# Patient Record
Sex: Male | Born: 1975 | Race: White | Hispanic: No | Marital: Married | Smoking: Never smoker
Health system: Southern US, Community
[De-identification: ages and names within clinical notes are randomized; demographics above are authoritative.]

## PROBLEM LIST (undated history)

## (undated) DIAGNOSIS — F419 Anxiety disorder, unspecified: Secondary | ICD-10-CM

## (undated) DIAGNOSIS — Z8052 Family history of malignant neoplasm of bladder: Secondary | ICD-10-CM

## (undated) DIAGNOSIS — F32A Depression, unspecified: Secondary | ICD-10-CM

## (undated) DIAGNOSIS — K219 Gastro-esophageal reflux disease without esophagitis: Secondary | ICD-10-CM

## (undated) HISTORY — PX: COLONOSCOPY: SHX174

## (undated) HISTORY — PX: NO PAST SURGERIES: SHX2092

## (undated) HISTORY — DX: Gastro-esophageal reflux disease without esophagitis: K21.9

## (undated) HISTORY — DX: Family history of malignant neoplasm of bladder: Z80.52

---

## 2018-07-08 ENCOUNTER — Other Ambulatory Visit: Payer: Self-pay | Admitting: Student

## 2018-07-08 DIAGNOSIS — R599 Enlarged lymph nodes, unspecified: Secondary | ICD-10-CM

## 2018-07-11 ENCOUNTER — Ambulatory Visit
Admission: RE | Admit: 2018-07-11 | Discharge: 2018-07-11 | Disposition: A | Discharge status: Home / Self Care | Payer: 59 | Source: Ambulatory Visit | Attending: Student | Admitting: Student

## 2018-07-11 DIAGNOSIS — R599 Enlarged lymph nodes, unspecified: Secondary | ICD-10-CM | POA: Diagnosis present

## 2021-01-09 ENCOUNTER — Encounter (HOSPITAL_COMMUNITY): Payer: Self-pay | Admitting: *Deleted

## 2021-01-09 ENCOUNTER — Emergency Department (HOSPITAL_COMMUNITY)
Admission: EM | Admit: 2021-01-09 | Discharge: 2021-01-09 | Disposition: A | Discharge status: Home / Self Care | Payer: BC Managed Care – PPO | Attending: Emergency Medicine | Admitting: Emergency Medicine

## 2021-01-09 ENCOUNTER — Other Ambulatory Visit: Payer: Self-pay

## 2021-01-09 DIAGNOSIS — K922 Gastrointestinal hemorrhage, unspecified: Secondary | ICD-10-CM | POA: Insufficient documentation

## 2021-01-09 DIAGNOSIS — R001 Bradycardia, unspecified: Secondary | ICD-10-CM | POA: Diagnosis not present

## 2021-01-09 DIAGNOSIS — R55 Syncope and collapse: Secondary | ICD-10-CM | POA: Diagnosis present

## 2021-01-09 HISTORY — DX: Anxiety disorder, unspecified: F41.9

## 2021-01-09 LAB — COMPREHENSIVE METABOLIC PANEL
ALT: 29 U/L (ref 0–44)
AST: 25 U/L (ref 15–41)
Albumin: 3.7 g/dL (ref 3.5–5.0)
Alkaline Phosphatase: 42 U/L (ref 38–126)
Anion gap: 6 (ref 5–15)
BUN: 16 mg/dL (ref 6–20)
CO2: 25 mmol/L (ref 22–32)
Calcium: 9 mg/dL (ref 8.9–10.3)
Chloride: 108 mmol/L (ref 98–111)
Creatinine, Ser: 1.22 mg/dL (ref 0.61–1.24)
GFR, Estimated: >60 mL/min (ref >60)
Glucose, Bld: 91 mg/dL (ref 70–99)
Potassium: 4 mmol/L (ref 3.5–5.1)
Sodium: 139 mmol/L (ref 135–145)
Total Bilirubin: 0.4 mg/dL (ref 0.3–1.2)
Total Protein: 6.7 g/dL (ref 6.5–8.1)

## 2021-01-09 LAB — CBC
HCT: 41.8 % (ref 39.0–52.0)
Hemoglobin: 13.4 g/dL (ref 13.0–17.0)
MCH: 29.1 pg (ref 26.0–34.0)
MCHC: 32.1 g/dL (ref 30.0–36.0)
MCV: 90.9 fL (ref 80.0–100.0)
Platelets: 325 10*3/uL (ref 150–400)
RBC: 4.6 MIL/uL (ref 4.22–5.81)
RDW: 13.2 % (ref 11.5–15.5)
WBC: 6.3 10*3/uL (ref 4.0–10.5)
nRBC: 0 % (ref 0.0–0.2)

## 2021-01-09 NOTE — Discharge Instructions (Addendum)
Call your primary care doctor or specialist as discussed in the next 2-3 days.   Return immediately back to the ER if:  Your symptoms worsen within the next 12-24 hours. You develop new symptoms such as new fevers, persistent vomiting, new pain, shortness of breath, or new weakness or numbness, or if you have any other concerns.  

## 2021-01-09 NOTE — ED Triage Notes (Signed)
Pt reports feeling fine this am, ate breakfast. Had multiple syncopal episodes this am while at work. Reports onset of generalized abd pain when he began to feel lightheaded. Denies chest pain or sob. Reports recent blood in stools.

## 2021-01-09 NOTE — ED Provider Notes (Signed)
Emergency Medicine Provider Triage Evaluation Note  Trexton Stachowski , a 45 y.o. male  was evaluated in triage.  Pt complains of ayncope  Pt has had blood in his stool   Review of Systems  Positive: nausea Negative: no fever or chills    Physical Exam  BP 131/87 (BP Location: Left Arm)   Pulse 75   Temp 97.8 F (36.6 C) (Oral)   Resp 16   SpO2 100%  Gen:   Awake, no distress   Resp:  Normal effort  MSK:   Moves extremities without difficulty  Other:    Medical Decision Making  Medically screening exam initiated at 10:18 AM.  Appropriate orders placed.  Khalib Kerns was informed that the remainder of the evaluation will be completed by another provider, this initial triage assessment does not replace that evaluation, and the importance of remaining in the ED until their evaluation is complete.     Sidney Ace 01/09/21 1018    Luna Fuse, MD 01/09/21 1800

## 2021-01-09 NOTE — ED Notes (Signed)
Notified nurse of pt HR

## 2021-01-09 NOTE — ED Provider Notes (Signed)
[facility] EMERGENCY DEPARTMENT Provider Note   CSN: [id number] Arrival date & time: 01/09/21  0948     History Chief Complaint  Patient presents with   Near Syncope    Jerry Allison is a 45 y.o. male.  Patient presents chief complaint of a syncopal episode at work.  He states he was sitting on a work when he felt a little lightheaded.  He stood up to take a walk and the next he knew he was waking up from the ground.  He was helped up by his colleagues and he sat down and he states that he syncopized again while he was sitting in his chair.  This was greater than 6 hours prior to arrival.  He states that he had a sharp spasm of abdominal pain during the syncopal episodes.  Patient states that has been having intermittent bloody stools ongoing for the past 2 months.  This has not significantly changed.  He had a spasm of abdominal pain but currently denies any abdominal pain.  Denies fevers or cough or vomiting.  No headache or chest pain.  He states that he is currently without any symptoms.      Past Medical History:  Diagnosis Date   Anxiety     There are no problems to display for this patient.   History reviewed. No pertinent surgical history.     History reviewed. No pertinent family history.  Social History   Tobacco Use   Smoking status: Never   Smokeless tobacco: Never  Substance Use Topics   Alcohol use: Yes    Comment: occ   Drug use: Yes    Types: Marijuana    Comment: occ    Home Medications Prior to Admission medications   Not on File    Allergies    Shellfish allergy  Review of Systems   Review of Systems  Constitutional:  Negative for fever.  HENT:  Negative for ear pain and sore throat.   Eyes:  Negative for pain.  Respiratory:  Negative for cough.   Cardiovascular:  Negative for chest pain.  Gastrointestinal:  Positive for abdominal pain.  Genitourinary:  Negative for flank pain.  Musculoskeletal:  Negative for back  pain.  Skin:  Negative for color change and rash.  Neurological:  Positive for syncope.  All other systems reviewed and are negative.  Physical Exam Updated Vital Signs BP (!) 136/91 (BP Location: Left Arm)   Pulse 65   Temp 98 F (36.7 C) (Oral)   Resp 18   SpO2 100%   Physical Exam Constitutional:      Appearance: He is well-developed.  HENT:     Head: Normocephalic.     Nose: Nose normal.  Eyes:     Extraocular Movements: Extraocular movements intact.  Cardiovascular:     Rate and Rhythm: Normal rate and regular rhythm.  Pulmonary:     Effort: Pulmonary effort is normal.  Skin:    Coloration: Skin is not jaundiced.  Neurological:     General: No focal deficit present.     Mental Status: He is alert and oriented to person, place, and time. Mental status is at baseline.     Cranial Nerves: No cranial nerve deficit.     Motor: No weakness.     Gait: Gait normal.    ED Results / Procedures / Treatments   Labs (all labs ordered are listed, but only abnormal results are displayed) Labs Reviewed  CBC  COMPREHENSIVE METABOLIC  PANEL  URINALYSIS, ROUTINE W REFLEX MICROSCOPIC  CBG MONITORING, ED    EKG EKG Interpretation  Date/Time:  Monday January 09 2021 09:57:38 EDT Ventricular Rate:  78 PR Interval:  154 QRS Duration: 92 QT Interval:  370 QTC Calculation: 421 R Axis:   77 Text Interpretation: Normal sinus rhythm Normal ECG Confirmed by Thamas Jaegers ([id number]) on 01/09/2021 5:12:57 PM  Radiology No results found.  Procedures Procedures   Medications Ordered in ED Medications - No data to display  ED Course  I have reviewed the triage vital signs and the nursing notes.  Pertinent labs & imaging results that were available during my care of the patient were reviewed by me and considered in my medical decision making (see chart for details).  Clinical Course as of 01/09/21 1804  Mon Jan 09, 2021  [id number] Basic metabolic panel [LS]    Clinical Course User  Index [LS] Fransico Meadow, [place]   MDM Rules/Calculators/A&P                           Vital signs are within normal limits on arrival blood pressure heart rate normal oxygenation normal on room air.  Labs are unremarkable white count chemistry hemoglobin normal.  EKG shows sinus rhythm no ST elevations depressions rate mildly bradycardic otherwise normal.  Abdominal exam is benign no guarding or tenderness.  Patient states has been having a history of rectal bleeding for the past 2 months that is intermittent, he was sent to see a gastroenterologist but was not able to finish the visit due to insurance issues.  Patient remains asymptomatic at this time he has been in the ER for over 6 hours without any additional adverse events.  He is ambulatory without assistance.  Discharged home stable condition.  Advise outpatient follow-up with GI within the week.  Advised immediate return for fevers pain recurrent episodes or any additional concerns.  Final Clinical Impression(s) / ED Diagnoses Final diagnoses:  Syncope and collapse  Gastrointestinal hemorrhage, unspecified gastrointestinal hemorrhage type    Rx / DC Orders ED Discharge Orders     None        Luna Fuse, MD 01/09/21 [phone]

## 2021-02-20 ENCOUNTER — Encounter: Payer: Self-pay | Admitting: Gastroenterology

## 2021-02-20 ENCOUNTER — Ambulatory Visit (INDEPENDENT_AMBULATORY_CARE_PROVIDER_SITE_OTHER): Payer: BC Managed Care – PPO | Admitting: Gastroenterology

## 2021-02-20 ENCOUNTER — Other Ambulatory Visit (INDEPENDENT_AMBULATORY_CARE_PROVIDER_SITE_OTHER): Payer: BC Managed Care – PPO

## 2021-02-20 VITALS — BP 142/80 | HR 76 | Ht 72.0 in | Wt 187.4 lb

## 2021-02-20 DIAGNOSIS — R1030 Lower abdominal pain, unspecified: Secondary | ICD-10-CM | POA: Diagnosis not present

## 2021-02-20 DIAGNOSIS — K921 Melena: Secondary | ICD-10-CM

## 2021-02-20 DIAGNOSIS — R197 Diarrhea, unspecified: Secondary | ICD-10-CM

## 2021-02-20 LAB — CBC WITH DIFFERENTIAL/PLATELET
Basophils Absolute: 0 10*3/uL (ref 0.0–0.1)
Basophils Relative: 0.5 % (ref 0.0–3.0)
Eosinophils Absolute: 0.2 10*3/uL (ref 0.0–0.7)
Eosinophils Relative: 3.5 % (ref 0.0–5.0)
HCT: 35.9 % — ABNORMAL LOW (ref 39.0–52.0)
Hemoglobin: 11.7 g/dL — ABNORMAL LOW (ref 13.0–17.0)
Lymphocytes Relative: 21.2 % (ref 12.0–46.0)
Lymphs Abs: 1.3 10*3/uL (ref 0.7–4.0)
MCHC: 32.7 g/dL (ref 30.0–36.0)
MCV: 86.2 fl (ref 78.0–100.0)
Monocytes Absolute: 0.7 10*3/uL (ref 0.1–1.0)
Monocytes Relative: 10.7 % (ref 3.0–12.0)
Neutro Abs: 4 10*3/uL (ref 1.4–7.7)
Neutrophils Relative %: 64.1 % (ref 43.0–77.0)
Platelets: 367 10*3/uL (ref 150.0–400.0)
RBC: 4.16 Mil/uL — ABNORMAL LOW (ref 4.22–5.81)
RDW: 13.2 % (ref 11.5–15.5)
WBC: 6.2 10*3/uL (ref 4.0–10.5)

## 2021-02-20 LAB — SEDIMENTATION RATE: Sed Rate: 6 mm/hr (ref 0–15)

## 2021-02-20 LAB — C-REACTIVE PROTEIN: CRP: <1 mg/dL (ref 0.5–20.0)

## 2021-02-20 MED ORDER — PLENVU 140 G PO SOLR: ORAL | 0 refills | Status: DC

## 2021-02-20 MED ORDER — DICYCLOMINE HCL 20 MG PO TABS
20.0000 mg | ORAL_TABLET | Freq: Three times a day (TID) | ORAL | 1 refills | Status: DC | PRN
Start: 2021-02-20 — End: 2022-01-03

## 2021-02-20 NOTE — Patient Instructions (Signed)
If you are age 45 or older, your body mass index should be between 23-30. Your Body mass index is 25.42 kg/m. If this is out of the aforementioned range listed, please consider follow up with your Primary Care Provider.  If you are age 12 or younger, your body mass index should be between 19-25. Your Body mass index is 25.42 kg/m. If this is out of the aformentioned range listed, please consider follow up with your Primary Care Provider.   Your provider has requested that you go to the basement level for lab work before leaving today. Press "B" on the elevator. The lab is located at the first door on the left as you exit the elevator.  You have been scheduled for a colonoscopy. Please follow written instructions given to you at your visit today.  Please pick up your prep supplies at the pharmacy within the next 1-3 days. If you use inhalers (even only as needed), please bring them with you on the day of your procedure.  The [facility] providers would like to encourage you to use [facility] to communicate with providers for non-urgent requests or questions.  Due to long hold times on the telephone, sending your provider a message by [facility] may be a faster and more efficient way to get a response.  Please allow 48 business hours for a response.  Please remember that this is for non-urgent requests.   It was a pleasure to see you today!  Thank you for trusting me with your gastrointestinal care!    Scott E. [name], MD

## 2021-02-20 NOTE — Progress Notes (Signed)
HPI : Jerry Allison is a very pleasant 45 year old male with a history of anxiety who was referred to [place] by Dr. Derinda Late for further evaluation of hematochezia, abdominal pain and diarrhea.  The patient states that he has been seeing blood in his stool intermittently for the past year, but for the past several months it has been near daily in occurrence.  He started having issues with abdominal pain and diarrhea about 3 to 4 months ago.  His abdominal pain is located in the lower abdomen and is sometimes still sometimes sharp and severe.  It is experienced on a daily basis.  Seems to be worse with prolonged standing and activity, sometimes worse with eating and often improved with bowel movements.  His bowel habits vary on a day-to-day basis, but typically has 2-3 bowel movements per day, and have up to 6 bowel movements.  His stools are often loose and poorly formed and often associated with urgency.  He has had scant incontinence on occasion.  No nocturnal bowel movements.  A year ago, he would have a bowel movement once a day or every other day. The blood is described as usually bright red, but sometimes dark maroon color.  It is seen in the stool, in the toilet water and on the toilet paper.  He denies pain with the passage of stool.  His weight has been stable. He denies any changes in his diet or medications that accompanied the change in his bowel habits. No family history of GI malignancy or inflammatory bowel disease.  He has never had a colonoscopy.  About 5 weeks ago, the patient presented to the emergency room for a near syncopal event.  This occurred in the setting of episode of abdominal pain.  In the ER, CBC was checked which was normal.  Vitals and EKG are also reassuring, and he was discharged home.  He has not had any further episodes of syncope or near syncope since then.     Past Medical History:  Diagnosis Date   Anxiety      Past Surgical History:  Procedure  Laterality Date   NO PAST SURGERIES     Family History  Problem Relation Age of Onset   Anxiety disorder Father    Colon cancer Neg Hx    Esophageal cancer Neg Hx    Stomach cancer Neg Hx    Pancreatic cancer Neg Hx    Liver disease Neg Hx    Inflammatory bowel disease Neg Hx    Social History   Tobacco Use   Smoking status: Never   Smokeless tobacco: Never  Substance Use Topics   Alcohol use: Yes    Comment: social-weekly   Drug use: Yes    Types: Marijuana    Comment: occ   Current Outpatient Medications  Medication Sig Dispense Refill   escitalopram (LEXAPRO) 10 MG tablet Take 5 mg by mouth daily.     No current facility-administered medications for this visit.   Allergies  Allergen Reactions   Shellfish Allergy      Review of Systems: All systems reviewed and negative except where noted in HPI.    No results found.  Physical Exam: BP (!) 142/80   Pulse 76   Ht 6' (1.829 m)   Wt 187 lb 6.4 oz (85 kg)   BMI 25.42 kg/m  Constitutional: Pleasant,well-developed, Caucasian male in no acute distress. HEENT: Normocephalic and atraumatic. Conjunctivae are normal. No scleral icterus. Cardiovascular: Normal rate, regular rhythm.  Pulmonary/chest: Effort normal and breath sounds normal. No wheezing, rales or rhonchi. Abdominal: Soft, nondistended, nontender. Bowel sounds active throughout. There are no masses palpable. No hepatomegaly. Extremities: no edema Neurological: Alert and oriented to person place and time. Skin: Skin is warm and dry. No rashes noted. Psychiatric: Normal mood and affect. Behavior is normal.  CBC    Component Value Date/Time   WBC 6.3 01/09/2021 1021   RBC 4.60 01/09/2021 1021   HGB 13.4 01/09/2021 1021   HCT 41.8 01/09/2021 1021   PLT 325 01/09/2021 1021   MCV 90.9 01/09/2021 1021   MCH 29.1 01/09/2021 1021   MCHC 32.1 01/09/2021 1021   RDW 13.2 01/09/2021 1021    CMP     Component Value Date/Time   NA 139 01/09/2021 1021    K 4.0 01/09/2021 1021   CL 108 01/09/2021 1021   CO2 25 01/09/2021 1021   GLUCOSE 91 01/09/2021 1021   BUN 16 01/09/2021 1021   CREATININE 1.22 01/09/2021 1021   CALCIUM 9.0 01/09/2021 1021   PROT 6.7 01/09/2021 1021   ALBUMIN 3.7 01/09/2021 1021   AST 25 01/09/2021 1021   ALT 29 01/09/2021 1021   ALKPHOS 42 01/09/2021 1021   BILITOT 0.4 01/09/2021 1021   GFRNONAA >60 01/09/2021 1021     ASSESSMENT AND PLAN: 45 year old male with 1 year history of intermittent hematochezia, which is now become daily, with several months of new abdominal pain and increase in stool frequency.  No weight loss, no nocturnal stools and his CBC was unremarkable last month.  I suspect that the patient most likely has irritable bowel syndrome with hemorrhoidal bleeding, but inflammatory bowel disease and a mass lesion are also on the differential and need to be evaluated with endoscopy.  For today, we will schedule for a routine diagnostic colonoscopy.  We will get inflammatory markers today as well as repeat CBC.  If inflammatory markers are elevated, or if his hemoglobin has dropped since August, then we will expedite his colonoscopy.  In the meantime, I recommend he try taking Bentyl as needed for his abdominal pain.  Hematochezia/abdominal pain/diarrhea - Diagnostic colonoscopy - CBC, ESR, CRP, TTG/IgA, fecal lactoferrin -Bentyl 20 mg p.o. every 6 hours as needed for abdominal pain   The details, risks (including bleeding, perforation, infection, missed lesions, medication reactions and possible hospitalization or surgery if complications occur), benefits, and alternatives to colonoscopy with possible biopsy and possible polypectomy were discussed with the patient and he consents to proceed.    Haru Shaff E. [name], MD [facility]    Derinda Late, MD

## 2021-02-21 LAB — FECAL LACTOFERRIN, QUANT
Fecal Lactoferrin: NEGATIVE
MICRO NUMBER:: 12422507
SPECIMEN QUALITY:: ADEQUATE

## 2021-02-21 LAB — IGA: Immunoglobulin A: 184 mg/dL (ref 47–310)

## 2021-02-21 LAB — TISSUE TRANSGLUTAMINASE, IGA: (tTG) Ab, IgA: <1 U/mL

## 2021-02-25 DIAGNOSIS — C20 Malignant neoplasm of rectum: Secondary | ICD-10-CM

## 2021-02-25 HISTORY — DX: Malignant neoplasm of rectum: C20

## 2021-03-01 NOTE — Progress Notes (Signed)
Jerry Allison, the tests for inflammation (ESR, CRP, fecal lactoferrin) and celiac disease were normal, but your hemoglobin has dropped slightly since it was last checked.  This is still possibly due to hemorrhoids, as sometimes hemorrhoids can bleed enough to cause drops in blood counts.   It is very important that we complete your colonoscopy next week to exclude other causes of bleeding and anemia.

## 2021-03-02 ENCOUNTER — Telehealth: Payer: Self-pay

## 2021-03-02 NOTE — Progress Notes (Signed)
Called patient and informed him of lab results and that it was important that he come for his Colonoscopy scheduled next week.

## 2021-03-02 NOTE — Telephone Encounter (Signed)
Called patient and informed him of lab results and that it is important that he keeps his procedure with dr.Cunningham next week. Patient had no questions at the end of call.

## 2021-03-07 ENCOUNTER — Other Ambulatory Visit: Payer: Self-pay | Admitting: Pediatric Infectious Disease

## 2021-03-07 ENCOUNTER — Other Ambulatory Visit: Payer: BC Managed Care – PPO

## 2021-03-07 ENCOUNTER — Telehealth: Payer: Self-pay

## 2021-03-07 ENCOUNTER — Encounter: Payer: Self-pay | Admitting: Gastroenterology

## 2021-03-07 ENCOUNTER — Ambulatory Visit (AMBULATORY_SURGERY_CENTER): Payer: BC Managed Care – PPO | Admitting: Gastroenterology

## 2021-03-07 ENCOUNTER — Other Ambulatory Visit: Payer: Self-pay

## 2021-03-07 VITALS — BP 108/78 | HR 53 | Temp 97.3°F | Resp 15 | Ht 72.0 in | Wt 187.0 lb

## 2021-03-07 DIAGNOSIS — D128 Benign neoplasm of rectum: Secondary | ICD-10-CM | POA: Diagnosis not present

## 2021-03-07 DIAGNOSIS — C20 Malignant neoplasm of rectum: Secondary | ICD-10-CM

## 2021-03-07 DIAGNOSIS — K6289 Other specified diseases of anus and rectum: Secondary | ICD-10-CM

## 2021-03-07 DIAGNOSIS — K51211 Ulcerative (chronic) proctitis with rectal bleeding: Secondary | ICD-10-CM | POA: Diagnosis not present

## 2021-03-07 DIAGNOSIS — K921 Melena: Secondary | ICD-10-CM

## 2021-03-07 DIAGNOSIS — R1030 Lower abdominal pain, unspecified: Secondary | ICD-10-CM

## 2021-03-07 MED ORDER — SODIUM CHLORIDE 0.9 % IV SOLN: 500.0000 mL | Freq: Once | INTRAVENOUS | Status: DC

## 2021-03-07 NOTE — Progress Notes (Signed)
Sedate, gd SR, tolerated procedure well, VSS, report to RN 

## 2021-03-07 NOTE — Op Note (Signed)
Jerry Allison Patient Name: Jerry Allison Procedure Date: 03/07/2021 12:08 PM MRN: [id number] Endoscopist: Disautel. [name] , MD Age: 45 Referring MD:  Date of Birth: [birth year] Gender: Male Account #: [id number] Procedure:                Colonoscopy Indications:              Hematochezia Medicines:                Monitored Anesthesia Care Procedure:                Pre-Anesthesia Assessment:                           - Prior to the procedure, a History and Physical                            was performed, and patient medications and                            allergies were reviewed. The patient's tolerance of                            previous anesthesia was also reviewed. The risks                            and benefits of the procedure and the sedation                            options and risks were discussed with the patient.                            All questions were answered, and informed consent                            was obtained. Prior Anticoagulants: The patient has                            taken no previous anticoagulant or antiplatelet                            agents. ASA Grade Assessment: II - A patient with                            mild systemic disease. After reviewing the risks                            and benefits, the patient was deemed in                            satisfactory condition to undergo the procedure.                           After obtaining informed consent, the colonoscope  was passed under direct vision. Throughout the                            procedure, the patient's blood pressure, pulse, and                            oxygen saturations were monitored continuously. The                            Olympus PCF-H190DL (#[id number]) Colonoscope was                            introduced through the anus and advanced rectal                            mass, but was unable to traverse the mass The  0441                            PCF-H190TL Slim SB Colonoscope was introduced                            through the anus and advanced to the the terminal                            ileum, with identification of the appendiceal                            orifice and IC valve.The colonoscopy was performed                            without difficulty. The patient tolerated the                            procedure well. The quality of the bowel                            preparation was good. Scope In: 12:14:18 PM Scope Out: 66:06:30 PM Scope Withdrawal Time: 0 hours 14 minutes 13 seconds  Total Procedure Duration: 0 hours 22 minutes 26 seconds  Findings:                 The perianal and digital rectal examinations were                            normal. Pertinent negatives include normal                            sphincter tone and no palpable rectal lesions.                           An ulcerated partially obstructing large friable                            mass was found in the proximal rectum (proximal  edge encountered at 15 cm, distal edge estimated at                            11 cm). The mass was circumferential. The mass                            measured four cm in length. No bleeding was                            present. The mass was not traversable with the                            pediatric colonoscope. An ultraslim colonoscope was                            then used and traversed the mass with minimal                            resistance. Biopsies were taken with a cold forceps                            for histology. Estimated blood loss was minimal.                            Area was tattooed with an injection of 2 mL of Spot                            (carbon black) about 2-3 cm proximal to the mass.                           A 3 mm polyp was found in the distal rectum. The                            polyp was sessile. The polyp was  removed with a                            cold snare. Resection and retrieval were complete.                            Estimated blood loss was minimal.                           The exam was otherwise normal throughout the                            examined colon.                           The terminal ileum appeared normal.                           The retroflexed view of the distal rectum and anal  verge was normal and showed no anal or rectal                            abnormalities. Complications:            No immediate complications. Estimated Blood Loss:     Estimated blood loss was minimal. 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 Biopsied. Tattooed.                           - One 3 mm polyp in the distal rectum, removed with                            a cold snare. Resected and retrieved.                           - The examined portion of the ileum was normal.                           - The distal rectum and anal verge are normal on                            retroflexion view. Recommendation:           - Patient has a contact number available for                            emergencies. The signs and symptoms of potential                            delayed complications were discussed with the                            patient. Return to normal activities tomorrow.                            Written discharge instructions were provided to the                            patient.                           - Resume previous diet.                           - Continue present medications.                           - Await pathology results.                           - Perform a CT scan (computed tomography) of chest  with contrast, abdomen with contrast and pelvis                            with contrast at appointment to be scheduled.                            - Check CEA today.                           - Will place referral to Colorectal surgeon Meenakshi Sazama E. [name], MD 03/07/2021 12:47:24 PM This report has been signed electronically.

## 2021-03-07 NOTE — Patient Instructions (Signed)
YOU HAD AN ENDOSCOPIC PROCEDURE TODAY AT THE [facility]:   Refer to the procedure report that was given to you for any specific questions about what was found during the examination.  If the procedure report does not answer your questions, please call your gastroenterologist to clarify.  If you requested that your care partner not be given the details of your procedure findings, then the procedure report has been included in a sealed envelope for you to review at your convenience later.  YOU SHOULD EXPECT: Some feelings of bloating in the abdomen. Passage of more gas than usual.  Walking can help get rid of the air that was put into your GI tract during the procedure and reduce the bloating. If you had a lower endoscopy (such as a colonoscopy or flexible sigmoidoscopy) you may notice spotting of blood in your stool or on the toilet paper. If you underwent a bowel prep for your procedure, you may not have a normal bowel movement for a few days.  Please Note:  You might notice some irritation and congestion in your nose or some drainage.  This is from the oxygen used during your procedure.  There is no need for concern and it should clear up in a day or so.  SYMPTOMS TO REPORT IMMEDIATELY:   Following lower endoscopy (colonoscopy or flexible sigmoidoscopy):  Excessive amounts of blood in the stool  Significant tenderness or worsening of abdominal pains  Swelling of the abdomen that is new, acute  Fever of 100F or higher  For urgent or emergent issues, a gastroenterologist can be reached at any hour by calling [phone]. Do not use MyChart messaging for urgent concerns.    DIET:  We do recommend a small meal at first, but then you may proceed to your regular diet.  Drink plenty of fluids but you should avoid alcoholic beverages for 24 hours.  ACTIVITY:  You should plan to take it easy for the rest of today and you should NOT DRIVE or use heavy machinery until tomorrow (because  of the sedation medicines used during the test).    FOLLOW UP: Our staff will call the number listed on your records 48-72 hours following your procedure to check on you and address any questions or concerns that you may have regarding the information given to you following your procedure. If we do not reach you, we will leave a message.  We will attempt to reach you two times.  During this call, we will ask if you have developed any symptoms of COVID 19. If you develop any symptoms (ie: fever, flu-like symptoms, shortness of breath, cough etc.) before then, please call [phone].  If you test positive for Covid 19 in the 2 weeks post procedure, please call and report this information to us.    If any biopsies were taken you will be contacted by phone or by letter within the next 1-3 weeks.  Please call us at [phone] if you have not heard about the biopsies in 3 weeks.    SIGNATURES/CONFIDENTIALITY: You and/or your care partner have signed paperwork which will be entered into your electronic medical record.  These signatures attest to the fact that that the information above on your After Visit Summary has been reviewed and is understood.  Full responsibility of the confidentiality of this discharge information lies with you and/or your care-partner. 

## 2021-03-07 NOTE — Telephone Encounter (Signed)
Patient notified of CT chest abd, pelvis scheduled for 03/09/21 1:00.  He is asked to be no solid food after 9:00 am and drink his contrast bottles at 11:00 and 1:00. He understands that he will be contacted directly by [facility] with an appointment date and time to discuss colon resection.  He is asked to call back for any questions or concerns.

## 2021-03-07 NOTE — Progress Notes (Signed)
Pt's states no medical or surgical changes since previsit or office visit. VS assessed by D.T 

## 2021-03-07 NOTE — Progress Notes (Signed)
History and Physical Interval Note:  No changes in patient's symptoms since his clinic visit Sept 26th.  Bleeding has decreased recently. Labs were notable for a mild anemia.  03/07/2021 12:08 PM  Jerry Allison  has presented today for endoscopic procedure(s), with the diagnosis of  Encounter Diagnoses  Name Primary?   Hematochezia Yes   Lower abdominal pain   .  The various methods of evaluation and treatment have been discussed with the patient and/or family. After consideration of risks, benefits and other options for treatment, the patient has consented to  the endoscopic procedure(s).   The patient's history has been reviewed, patient examined, no change in status, stable for endoscopic procedure(s).  I have reviewed the patient's chart and labs.  Questions were answered to the patient's satisfaction.     Byrne Capek E. [name], MD [facility]

## 2021-03-08 LAB — CEA: CEA: 1.7 ng/mL

## 2021-03-08 NOTE — Progress Notes (Signed)
I spoke with the patient today and informed him that the biopsies confirmed the diagnosis of cancer.  He is scheduled for his CT tomorrow.  His CEA level was normal.  We will likely be getting an MRI as well, based on the CT results. He informed me that he is going out of the country on Monday for a vacation and will be back Saturday.  I advised him to sign up for MyChart so that he can more easily be reached by Dr. Orest Dikes office while he is out of the country. I also advised him to recommend his first degree relatives undergo a screening colonoscopy at age 10  and that they should have one every 5 years.  His brother is 87 and should get his colonoscopy now.

## 2021-03-09 ENCOUNTER — Telehealth: Payer: Self-pay

## 2021-03-09 ENCOUNTER — Ambulatory Visit (INDEPENDENT_AMBULATORY_CARE_PROVIDER_SITE_OTHER)
Admission: RE | Admit: 2021-03-09 | Discharge: 2021-03-09 | Disposition: A | Discharge status: Home / Self Care | Payer: BC Managed Care – PPO | Source: Ambulatory Visit | Attending: Gastroenterology | Admitting: Gastroenterology

## 2021-03-09 ENCOUNTER — Other Ambulatory Visit: Payer: Self-pay

## 2021-03-09 DIAGNOSIS — K6289 Other specified diseases of anus and rectum: Secondary | ICD-10-CM | POA: Diagnosis not present

## 2021-03-09 DIAGNOSIS — K921 Melena: Secondary | ICD-10-CM

## 2021-03-09 MED ORDER — IOHEXOL 350 MG/ML SOLN
100.0000 mL | Freq: Once | INTRAVENOUS | Status: AC | PRN
  Administered 2021-03-09: 100 mL via INTRAVENOUS

## 2021-03-09 NOTE — Telephone Encounter (Signed)
  Follow up Call-  Call back number 03/07/2021  Post procedure Call Back phone  # [phone]  Permission to leave phone message Yes     Patient questions:  Do you have a fever, pain , or abdominal swelling? No. Pain Score  0 *  Have you tolerated food without any problems? Yes.    Have you been able to return to your normal activities? Yes.    Do you have any questions about your discharge instructions: Diet   No. Medications  No. Follow up visit  No.  Do you have questions or concerns about your Care? No.  Actions: * If pain score is 4 or above: No action needed, pain <4.  Have you developed a fever since your procedure? no  2.   Have you had an respiratory symptoms (SOB or cough) since your procedure? no  3.   Have you tested positive for COVID 19 since your procedure no  4.   Have you had any family members/close contacts diagnosed with the COVID 19 since your procedure?  no   If yes to any of these questions please route to Joylene John, RN and Joella Prince, RN

## 2021-03-17 ENCOUNTER — Telehealth: Payer: Self-pay

## 2021-03-17 ENCOUNTER — Other Ambulatory Visit: Payer: Self-pay

## 2021-03-17 DIAGNOSIS — K6289 Other specified diseases of anus and rectum: Secondary | ICD-10-CM

## 2021-03-17 DIAGNOSIS — R1030 Lower abdominal pain, unspecified: Secondary | ICD-10-CM

## 2021-03-17 NOTE — Telephone Encounter (Signed)
Patient is scheduled with Dr. Dema Severin for 03/21/21 9:40 with [facility]

## 2021-03-17 NOTE — Telephone Encounter (Signed)
-----   Message from [name], MD sent at 03/15/2021  2:34 PM EDT ----- Regarding: FW: Another new rectal CA Vaughan Basta,  Can you please contact Mr. Tierce and let him know his CT did not show any evidence of metastatic disease?  Also, Dr. Dema Severin would like an MRI of the pelvis for better local staging of the tumor.  Can you order that for him? Thanks,  Dr. Loletha Grayer  ----- Message ----- From: Ileana Roup, MD Sent: 03/14/2021  10:17 AM EDT To: [name], MD Subject: RE: Another new rectal CA                      Brigid Re,   Sounds like a plan. Agree with everything you said. MRI would be perfect for local staging. We will get him scheduled and I will have our office work on referrals to both medical and radiation oncology.  Gerald Stabs ----- Message ----- From: [name], MD Sent: 03/07/2021   5:16 PM EDT To: Ileana Roup, MD Subject: Another new rectal CA                          Hi Dr. Dema Severin,  I see you're out of the office, but I just wanted to get this guy on your radar when you get back. 45 year old healthy male with several months of hematochezia, change in bowel habits, found to have 4 cm partially obstructing circumferential ulcerated mass in the proximal rectum (from about 11-15 cm from anal verge).  Not able to get past with pediatric colonoscope, but was able to get through with ultraslim colonoscope without much difficulty.  Tattooed just proximal to mass.  He's not very symptomatic currently. Getting  CT c/a/p, cea, will get MRI as well unless you don't want it. Thanks again,  [employer]

## 2021-03-17 NOTE — Telephone Encounter (Signed)
Called pt and informed of results and recommendations as reviewed and documented by Dr. [name]. Verbalized acceptance and understanding. Requested auth for MRI from [facility], whom advised pt plan does not require precert. Following message sent urgently to Rhys Martini and April Pait:  [facility] Phone: [phone] Fax: [phone]   Imaging Ordered: MRI Pelvis  Diagnosis: Rectal mass, abd pain  Ordering Provider: Dr. [name]  Is a Prior Authorization needed? We are in the process of obtaining it now  Is the patient Diabetic? No  Does the patient have Hypertension? No  Does the patient have any implanted devices or hardware? No  Date of last BUN/Creat, if needed? N/A  Patient Weight? 187#  Is the patient able to get on the table? Yes  Has the patient been diagnosed with COVID? No  Is the patient waiting on COVID testing results? No  Thank you for your assistance! [facility]

## 2021-03-20 NOTE — Telephone Encounter (Signed)
Pt scheduled for MR 10/29@9am .

## 2021-03-21 ENCOUNTER — Telehealth: Payer: Self-pay | Admitting: Hematology

## 2021-03-21 NOTE — Telephone Encounter (Signed)
Scheduled appointments per 10/25 referrals. Patient is aware.

## 2021-03-25 ENCOUNTER — Ambulatory Visit (HOSPITAL_COMMUNITY): Payer: BC Managed Care – PPO

## 2021-03-27 ENCOUNTER — Ambulatory Visit (HOSPITAL_COMMUNITY)
Admission: RE | Admit: 2021-03-27 | Discharge: 2021-03-27 | Disposition: A | Discharge status: Home / Self Care | Payer: BC Managed Care – PPO | Source: Ambulatory Visit | Attending: Gastroenterology | Admitting: Gastroenterology

## 2021-03-27 ENCOUNTER — Other Ambulatory Visit: Payer: Self-pay

## 2021-03-27 ENCOUNTER — Other Ambulatory Visit: Payer: Self-pay | Admitting: Gastroenterology

## 2021-03-27 DIAGNOSIS — K6289 Other specified diseases of anus and rectum: Secondary | ICD-10-CM | POA: Insufficient documentation

## 2021-03-27 DIAGNOSIS — R1030 Lower abdominal pain, unspecified: Secondary | ICD-10-CM | POA: Insufficient documentation

## 2021-03-27 NOTE — Progress Notes (Signed)
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   MRI Pelvis 03/27/2021: Low sigmoid primary, greater than 15 cm from the anal verge, as detailed above. Poorly delineated secondary to underdistention in this region.  No evidence of pelvic nodal metastasis. 3. Trace perisigmoid fluid, new since the prior CT.  CT CAP 03/09/2021: Asymmetrical thickening in the proximal/mid rectum.  No evidence of metastatic disease.    Colonoscopy 03/07/2021: Malignant appearing partially obstructing tumor in the proximal rectum.  Biopsies of Rectal Mass 03/07/2021    Past/Anticipated interventions by surgeon, if any:  Dr. Dema Severin 03/21/2021 -Referrals to medical and radiation oncology and genetic counselors. -Return to office for follow-up in 1-2 weeks. -This does appear to be originating from his rectum however. -We discussed that based upon the MRI findings, the possibility of total neoadjuvant treatment. -Given that this lesion is not fully palpable on exam, would not be a good candidate for any sort of watch and wait approach.    Past/Anticipated interventions by medical oncology, if any:  Dr. Burr Medico 03/29/2021  Weight changes, if any: No  Bowel/Bladder complaints, if any: Fluctuates from constipation to diarrhea.  Denies urinary changes.  Nausea / Vomiting, if any: No  Pain issues, if any: Having some rectal pain due to MRI on yesterday.    Any blood per rectum: Notes blood with every stool, notes it mixed in with stool, when wiping, and falling into toilet bowl.    SAFETY ISSUES: Prior radiation? No Pacemaker/ICD? No Possible current pregnancy? N/a Is the patient on methotrexate? No  Current Complaints/Details:

## 2021-03-28 ENCOUNTER — Other Ambulatory Visit: Payer: Self-pay | Admitting: Genetic Counselor

## 2021-03-28 ENCOUNTER — Encounter: Payer: Self-pay | Admitting: Radiation Oncology

## 2021-03-28 ENCOUNTER — Ambulatory Visit
Admission: RE | Admit: 2021-03-28 | Discharge: 2021-03-28 | Disposition: A | Discharge status: Home / Self Care | Payer: BC Managed Care – PPO | Source: Ambulatory Visit | Attending: Radiation Oncology | Admitting: Radiation Oncology

## 2021-03-28 ENCOUNTER — Other Ambulatory Visit: Payer: Self-pay

## 2021-03-28 VITALS — BP 141/89 | HR 84 | Temp 97.8°F | Resp 18 | Ht 72.0 in | Wt 188.2 lb

## 2021-03-28 DIAGNOSIS — Z79899 Other long term (current) drug therapy: Secondary | ICD-10-CM | POA: Insufficient documentation

## 2021-03-28 DIAGNOSIS — C20 Malignant neoplasm of rectum: Secondary | ICD-10-CM | POA: Diagnosis not present

## 2021-03-28 NOTE — Progress Notes (Addendum)
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   HISTORY OF PRESENT ILLNESS: Jerry Allison is a 45 y.o. male seen at the request of Dr. Dema Severin for a newly diagnosed rectal cancer. The patient noted several months of bright red blood per rectum and was seen by GI and underwent a colonoscopy with Dr. [name] on 03/07/21 that revealed an ulcerated partially obstructing large mass in the proximal rectum, circumferential and measuring up to 4 cm. A 3 mm polyp in the distal rectum was also noted. Biopsies revealed adenocarcinoma of the rectal mass and the small polyp was consistent with a tubular adenoma without dysplasia or malignancy. He had a CEA that was normal at 1.7. A CT CAP on 03/09/21 showed asymmetric wall thickening of the proximal/mid rectum and no evidence of metastatic disease was otherwise noted. MRI on 03/27/21 showed no adenopathy and measured the tumor at 5.6 cm though limited by underdistention within the sigmoid. T staging was not given, and the tumor was 15 cm from the anal verge. He will see Dr. Burr Medico tomorrow as well. He's seen today to discuss next steps of treatment.    PREVIOUS RADIATION THERAPY: No   PAST MEDICAL HISTORY:  Past Medical History:  Diagnosis Date   Anxiety    Rectal cancer ([place]) 02/2021       PAST SURGICAL HISTORY: Past Surgical History:  Procedure Laterality Date   NO PAST SURGERIES       FAMILY HISTORY:  Family History  Problem Relation Age of Onset   Anxiety disorder Father    Colon cancer Neg Hx    Esophageal cancer Neg Hx    Stomach cancer Neg Hx    Pancreatic cancer Neg Hx    Liver disease Neg Hx    Inflammatory bowel disease Neg Hx       SOCIAL HISTORY:  reports that he has never smoked. He has never used smokeless tobacco. He reports current alcohol use. He reports current drug use. Drug: Marijuana.  The patient is married and lives in [place]. He works in a [employer]. He has three teenage children, and likes playing softball and basketball throughout the year.   ALLERGIES: Shellfish allergy   MEDICATIONS:  Current Outpatient Medications  Medication Sig Dispense Refill   dicyclomine (BENTYL) 20 MG tablet Take 1 tablet (20 mg total) by mouth 3 (three) times daily as needed for spasms. 60 tablet 1   escitalopram (LEXAPRO) 10 MG tablet Take 5 mg by mouth daily.     No current facility-administered medications for this encounter.     REVIEW OF SYSTEMS: On review of systems, the patient reports that he is doing okay. He has varying symptoms of loose frequent stools/mucuous, blood with most bathroom visits, and no urinary symptoms. He's concerned about bathroom functions and erectile function with radiotherapy. He does have pain when he drinks caffeinated drinks at times, and finds that eating helps sometimes setting his crampiness. He feels uncomfortable today after the rectal contrast from yesterday. He does have bleeding with most stools and this sometimes turns the water in the toilet red. No other complaints are verbalized.      PHYSICAL EXAM:  Wt Readings  from Last 3 Encounters:  03/28/21 188 lb 4 oz (85.4 kg)  03/07/21 187 lb (84.8 kg)  02/20/21 187 lb 6.4 oz (85 kg)   Temp Readings from Last 3 Encounters:  03/28/21 97.8 F (36.6 C) (Temporal)  03/07/21 (!) 97.3 F (36.3 C) (Skin)  01/09/21 98 F (36.7 C) (Oral)   BP Readings from Last 3 Encounters:  03/28/21 (!) 141/89  03/07/21 108/78  02/20/21 (!) 142/80   Pulse Readings from Last 3 Encounters:  03/28/21 84  03/07/21 (!) 53  02/20/21 76   Pain Assessment Pain Score: 5 /10  In general this is a well appearing caucasian male in  no acute distress. He's alert and oriented x4 and appropriate throughout the examination. Cardiopulmonary assessment is negative for acute distress and he exhibits normal effort.     ECOG = 1  0 - Asymptomatic (Fully active, able to carry on all predisease activities without restriction)  1 - Symptomatic but completely ambulatory (Restricted in physically strenuous activity but ambulatory and able to carry out work of a light or sedentary nature. For example, light housework, office work)  2 - Symptomatic, <50% in bed during the day (Ambulatory and capable of all self care but unable to carry out any work activities. Up and about more than 50% of waking hours)  3 - Symptomatic, >50% in bed, but not bedbound (Capable of only limited self-care, confined to bed or chair 50% or more of waking hours)  4 - Bedbound (Completely disabled. Cannot carry on any self-care. Totally confined to bed or chair)  5 - Death   Eustace Pen MM, Creech RH, Tormey DC, et al. [phone]). "Toxicity and response criteria of the [facility]". [place] Oncol. 5 (6): 649-55    LABORATORY DATA:  Lab Results  Component Value Date   WBC 6.2 02/20/2021   HGB 11.7 (L) 02/20/2021   HCT 35.9 (L) 02/20/2021   MCV 86.2 02/20/2021   PLT 367.0 02/20/2021   Lab Results  Component Value Date   NA 139 01/09/2021   K 4.0 01/09/2021   CL 108 01/09/2021   CO2 25 01/09/2021   Lab Results  Component Value Date   ALT 29 01/09/2021   AST 25 01/09/2021   ALKPHOS 42 01/09/2021   BILITOT 0.4 01/09/2021      RADIOGRAPHY: MR PELVIS WO CONTRAST  Result Date: 03/28/2021 CLINICAL DATA:  Proximal rectal mass on colonoscopy. At 11-15 cm from anal verge. EXAM: MRI PELVIS WITHOUT CONTRAST TECHNIQUE: Multiplanar multisequence MR imaging of the pelvis was performed. No intravenous contrast was administered. Ultrasound gel was administered per rectum to optimize tumor evaluation. COMPARISON:  Colonoscopy report of  03/07/2021 FINDINGS: TUMOR LOCATION Tumor distance from Anal Verge/Skin Surface: Approximately 15 cm, including on 21/3. This is most consistent with a low sigmoid primary. TUMOR DESCRIPTION Circumferential Extent: Circumferential, including on 41/6. Tumor Length: Suboptimally delineated secondary to underdistention within the sigmoid. Based on restricted diffusion, on the order of 5.6 cm on 43/8. Circumferential low sigmoid mass, as detailed above. No surrounding lymphadenopathy. There are small nodes in this surrounding mesocolon, including on 12/07. Trace fluid within the pelvis and adjacent the sigmoid, including on 05/04. New since the prior CT. No pelvic sidewall adenopathy. Normal urinary bladder and prostate.  No bowel obstruction. IMPRESSION: 1. Low sigmoid primary, greater than 15 cm from the anal verge, as detailed above. Poorly delineated secondary to underdistention in this region. 2. No evidence of pelvic nodal metastasis. 3. Trace perisigmoid  fluid, new since the prior CT. Electronically Signed   By: Abigail Miyamoto M.D.   On: 03/28/2021 09:52   CT CHEST ABDOMEN PELVIS W CONTRAST  Result Date: 03/13/2021 CLINICAL DATA:  Rectal mass, hematochezia EXAM: CT CHEST, ABDOMEN, AND PELVIS WITH CONTRAST TECHNIQUE: Multidetector CT imaging of the chest, abdomen and pelvis was performed following the standard protocol during bolus administration of intravenous contrast. CONTRAST:  163mL OMNIPAQUE IOHEXOL 350 MG/ML SOLN COMPARISON:  100 mL Omnipaque 350 IV FINDINGS: CT CHEST FINDINGS Cardiovascular: Heart size normal.  No pericardial effusion. Mediastinum/Nodes: No mass or adenopathy. Lungs/Pleura: No pleural effusion.  No pneumothorax.  Lungs clear. Musculoskeletal: No chest wall mass or suspicious bone lesions identified. CT ABDOMEN PELVIS FINDINGS Hepatobiliary: No focal liver abnormality is seen. No gallstones, gallbladder wall thickening, or biliary dilatation. Pancreas: Unremarkable. No pancreatic ductal  dilatation or surrounding inflammatory changes. Spleen: Normal in size without focal abnormality. Adrenals/Urinary Tract: Adrenal glands unremarkable. 2.6 cm probable benign cyst, superior left kidney. No urolithiasis or hydronephrosis. Urinary bladder incompletely distended. Stomach/Bowel: Stomach is incompletely distended, unremarkable. Small bowel is decompressed with good distal passage of oral contrast material. Normal appendix. The colon is nondilated. There is asymmetric wall thickening in the mid rectum which may correlate with the mass seen on colonoscopy. No evidence of perforation or abscess. Vascular/Lymphatic: No perirectal or mesenteric adenopathy. No retroperitoneal or pelvic adenopathy. Reproductive: Prostate is unremarkable. Other: Bilateral pelvic phleboliths. No ascites. No free air. No peritoneal mass. Musculoskeletal: Mild bilateral hip DJD. No fracture or worrisome bone lesion. IMPRESSION: 1. Asymmetric wall thickening in the proximal/mid rectum may correspond to mass seen on endoscopy. 2. Negative for adenopathy or evidence of distal metastatic disease. Electronically Signed   By: Lucrezia Europe M.D.   On: 03/13/2021 05:19       IMPRESSION/PLAN: 1. Adenocarcinoma of the proximal rectum/sigmoid colon. Dr. Lisbeth Renshaw discusses the pathology findings and reviews the nature of rectal verus colonic carcinoma. His MRI will be discussed tomorrow   in multidisciplinary GI oncology conference. We discussed the different pathways and course of treatment in the neoadjuvant stetting if this were classified as rectal cancer versus omission of radiotherapy if this was sigmoid colon cancer.  We discussed the risks, benefits, short, and long term effects of radiotherapy, as well as the curative intent, and the patient is interested in proceeding if appropriate. Dr. Lisbeth Renshaw discusses the delivery and logistics of radiotherapy and anticipates a course of 5 1/2 weeks of radiotherapy if it is decided that this is rectal  carcinoma. He is also aware of the role of systemic therapy and will outline this more concretely with Dr. Burr Medico tomorrow afternoon.   In a visit lasting 60 minutes, greater than 50% of the time was spent face to face discussing the patient's condition, in preparation for the discussion, and coordinating the patient's care.  The above documentation reflects my direct findings during this shared patient visit. Please see the separate note by Dr. Lisbeth Renshaw on this date for the remainder of the patient's plan of care.    Carola Rhine, [facility]   **Disclaimer: This note was dictated with voice recognition software. Similar sounding words can inadvertently be transcribed and this note may contain transcription errors which may not have been corrected upon publication of note.**  Addendum: Dr. Lisbeth Renshaw did examine the patient and performed a rectal examination. He communicated with me that he could not feel the tumor on digital rectal examination.     Carola Rhine, PAC

## 2021-03-29 ENCOUNTER — Encounter: Payer: Self-pay | Admitting: Hematology

## 2021-03-29 ENCOUNTER — Inpatient Hospital Stay (HOSPITAL_BASED_OUTPATIENT_CLINIC_OR_DEPARTMENT_OTHER): Payer: BC Managed Care – PPO | Admitting: Genetic Counselor

## 2021-03-29 ENCOUNTER — Other Ambulatory Visit: Payer: Self-pay

## 2021-03-29 ENCOUNTER — Inpatient Hospital Stay: Payer: BC Managed Care – PPO | Attending: Hematology | Admitting: Hematology

## 2021-03-29 ENCOUNTER — Inpatient Hospital Stay: Payer: BC Managed Care – PPO

## 2021-03-29 ENCOUNTER — Encounter: Payer: Self-pay | Admitting: Genetic Counselor

## 2021-03-29 VITALS — BP 132/95 | HR 69 | Temp 98.3°F | Resp 16 | Ht 72.0 in | Wt 187.3 lb

## 2021-03-29 DIAGNOSIS — R1013 Epigastric pain: Secondary | ICD-10-CM | POA: Insufficient documentation

## 2021-03-29 DIAGNOSIS — C20 Malignant neoplasm of rectum: Secondary | ICD-10-CM | POA: Diagnosis present

## 2021-03-29 DIAGNOSIS — Z79899 Other long term (current) drug therapy: Secondary | ICD-10-CM | POA: Insufficient documentation

## 2021-03-29 DIAGNOSIS — K5900 Constipation, unspecified: Secondary | ICD-10-CM | POA: Diagnosis not present

## 2021-03-29 DIAGNOSIS — F419 Anxiety disorder, unspecified: Secondary | ICD-10-CM | POA: Diagnosis not present

## 2021-03-29 DIAGNOSIS — D649 Anemia, unspecified: Secondary | ICD-10-CM | POA: Diagnosis not present

## 2021-03-29 DIAGNOSIS — Z8052 Family history of malignant neoplasm of bladder: Secondary | ICD-10-CM

## 2021-03-29 DIAGNOSIS — D500 Iron deficiency anemia secondary to blood loss (chronic): Secondary | ICD-10-CM

## 2021-03-29 LAB — CBC WITH DIFFERENTIAL (CANCER CENTER ONLY)
Abs Immature Granulocytes: 0.01 10*3/uL (ref 0.00–0.07)
Basophils Absolute: 0 10*3/uL (ref 0.0–0.1)
Basophils Relative: 0 %
Eosinophils Absolute: 0.3 10*3/uL (ref 0.0–0.5)
Eosinophils Relative: 4 %
HCT: 34.4 % — ABNORMAL LOW (ref 39.0–52.0)
Hemoglobin: 11.1 g/dL — ABNORMAL LOW (ref 13.0–17.0)
Immature Granulocytes: 0 %
Lymphocytes Relative: 21 %
Lymphs Abs: 1.4 10*3/uL (ref 0.7–4.0)
MCH: 27.5 pg (ref 26.0–34.0)
MCHC: 32.3 g/dL (ref 30.0–36.0)
MCV: 85.1 fL (ref 80.0–100.0)
Monocytes Absolute: 0.8 10*3/uL (ref 0.1–1.0)
Monocytes Relative: 11 %
Neutro Abs: 4.3 10*3/uL (ref 1.7–7.7)
Neutrophils Relative %: 64 %
Platelet Count: 368 10*3/uL (ref 150–400)
RBC: 4.04 MIL/uL — ABNORMAL LOW (ref 4.22–5.81)
RDW: 12.4 % (ref 11.5–15.5)
WBC Count: 6.8 10*3/uL (ref 4.0–10.5)
nRBC: 0 % (ref 0.0–0.2)

## 2021-03-29 LAB — FERRITIN: Ferritin: 4 ng/mL — ABNORMAL LOW (ref 24–336)

## 2021-03-29 LAB — CMP (CANCER CENTER ONLY)
ALT: 33 U/L (ref 0–44)
AST: 23 U/L (ref 15–41)
Albumin: 4 g/dL (ref 3.5–5.0)
Alkaline Phosphatase: 56 U/L (ref 38–126)
Anion gap: 6 (ref 5–15)
BUN: 18 mg/dL (ref 6–20)
CO2: 27 mmol/L (ref 22–32)
Calcium: 8.9 mg/dL (ref 8.9–10.3)
Chloride: 106 mmol/L (ref 98–111)
Creatinine: 1.19 mg/dL (ref 0.61–1.24)
GFR, Estimated: >60 mL/min (ref >60)
Glucose, Bld: 97 mg/dL (ref 70–99)
Potassium: 4.5 mmol/L (ref 3.5–5.1)
Sodium: 139 mmol/L (ref 135–145)
Total Bilirubin: <0.2 mg/dL — ABNORMAL LOW (ref 0.3–1.2)
Total Protein: 7.3 g/dL (ref 6.5–8.1)

## 2021-03-29 LAB — GENETIC SCREENING ORDER

## 2021-03-29 LAB — IRON AND TIBC
Iron: 23 ug/dL — ABNORMAL LOW (ref 42–163)
Saturation Ratios: 5 % — ABNORMAL LOW (ref 20–55)
TIBC: 430 ug/dL — ABNORMAL HIGH (ref 202–409)
UIBC: 408 ug/dL — ABNORMAL HIGH (ref 117–376)

## 2021-03-29 NOTE — Progress Notes (Signed)
REFERRING PROVIDER: Ileana Roup, MD [facility],  [place] [address]  PRIMARY PROVIDER:  Derinda Late, MD  PRIMARY REASON FOR VISIT:  1. Family history of bladder cancer   2. Adenocarcinoma of rectum ([place])      HISTORY OF PRESENT ILLNESS:   [name], a 45 y.o. male, was seen for a [place] cancer genetics consultation at the request of Dr. Dema Severin due to a personal and family history of cancer.  Jerry Allison presents to clinic today to discuss the possibility of a hereditary predisposition to cancer, genetic testing, and to further clarify his future cancer risks, as well as potential cancer risks for family members.   In October 2022, at the age of 47, Jerry Allison was diagnosed with colorectal cancer.  The treatment plan included surgery to remove the cancer.  history of cancer.    CANCER HISTORY:  Oncology History  Adenocarcinoma of rectum ([place])  03/07/2021 Procedure   Colonoscopy, Dr. [name]  Impression - Malignant appearing partially obstructing tumor in the proximal rectum. Biopsied. Tattooed. - One 3 mm polyp in the distal rectum, removed with a cold snare. Resected and retrieved. - The examined portion of the ileum was normal. - The distal rectum and anal verge are normal on retroflexion view.   03/07/2021 Pathology Results   Diagnosis 1. Rectum, biopsy, mass - ADENOCARCINOMA - SEE COMMENT 2. Rectum, biopsy, polyp (1) - TUBULAR ADENOMA (1 OF 1 FRAGMENTS) - NO HIGH-GRADE DYSPLASIA OR MALIGNANCY IDENTIFIED   03/09/2021 Imaging   CT CAP  IMPRESSION: 1. Asymmetric wall thickening in the proximal/mid rectum may correspond to mass seen on endoscopy. 2. Negative for adenopathy or evidence of distal metastatic disease.   03/27/2021 Imaging   MRI Pelvis  IMPRESSION: 1. Low sigmoid primary, greater than 15 cm from the anal verge, as detailed above. Poorly delineated secondary to underdistention in this region. 2. No evidence of pelvic  nodal metastasis. 3. Trace perisigmoid fluid, new since the prior CT.   03/28/2021 Initial Diagnosis   Adenocarcinoma of rectum [facility])      Past Medical History:  Diagnosis Date   Anxiety    Family history of bladder cancer    Rectal cancer ([place]) 02/2021    Past Surgical History:  Procedure Laterality Date   NO PAST SURGERIES      Social History   Socioeconomic History   Marital status: Married    Spouse name: Not on file   Number of children: 3   Years of education: Not on file   Highest education level: Not on file  Occupational History   Not on file  Tobacco Use   Smoking status: Never   Smokeless tobacco: Never  Substance and Sexual Activity   Alcohol use: Yes    Comment: social-weekly   Drug use: Yes    Types: Marijuana    Comment: occ   Sexual activity: Not on file  Other Topics Concern   Not on file  Social History Narrative   Not on file   Social Determinants of Health   Financial Resource Strain: Not on file  Food Insecurity: Not on file  Transportation Needs: Not on file  Physical Activity: Not on file  Stress: Not on file  Social Connections: Not on file     FAMILY HISTORY:  We obtained a detailed, 4-generation family history.  Significant diagnoses are listed below: Family History  Problem Relation Age of Onset   Anxiety disorder Father    Healthy Brother  Bladder Cancer Maternal Grandmother 59   Healthy Half-Sister        paternal half   Colon cancer Neg Hx    Esophageal cancer Neg Hx    Stomach cancer Neg Hx    Pancreatic cancer Neg Hx    Liver disease Neg Hx    Inflammatory bowel disease Neg Hx     The patient has two sons and a daughter who are cancer free.  He has one brother and a paternal half sister who are cancer free.  Both parents are living.  The patient's mother ha two maternal half brothers and four maternal half sisters who are all cancer free.  The maternal grandparents are deceased.  The grandmother had bladder  cancer in her 56's.  The patient's father is 63.  He has two brothers who are cancer free.  His parents are deceased.  Jerry Allison is unaware of previous family history of genetic testing for hereditary cancer risks. Patient's maternal ancestors are of [place] descent, and paternal ancestors are of [place] descent. There is no reported Ashkenazi Jewish ancestry. There is no known consanguinity.  GENETIC COUNSELING ASSESSMENT: Mr. [name] is a 45 y.o. male with a personal and family history of cancer which is somewhat suggestive of a hereditary cancer syndrome and predisposition to cancer given his young age of onset of cancer. We, therefore, discussed and recommended the following at today's visit.   DISCUSSION: We discussed that, in general, most cancer is not inherited in families, but instead is sporadic or familial. Sporadic cancers occur by chance and typically happen at older ages (>50 years) as this type of cancer is caused by genetic changes acquired during an individual's lifetime. Some families have more cancers than would be expected by chance; however, the ages or types of cancer are not consistent with a known genetic mutation or known genetic mutations have been ruled out. This type of familial cancer is thought to be due to a combination of multiple genetic, environmental, hormonal, and lifestyle factors. While this combination of factors likely increases the risk of cancer, the exact source of this risk is not currently identifiable or testable.  We discussed that 5 - 7% of colon cancer is hereditary, with most cases associated with Lynch syndrome.  There are other genes that can be associated with hereditary colon cancer syndromes.  These include APC, MUTYH and CHEK2.  We discussed that testing is beneficial for several reasons including knowing how to follow individuals after completing their treatment, and understand if other family members could be at risk for cancer and  allow them to undergo genetic testing.   We reviewed the characteristics, features and inheritance patterns of hereditary cancer syndromes. We also discussed genetic testing, including the appropriate family members to test, the process of testing, insurance coverage and turn-around-time for results. We discussed the implications of a negative, positive, carrier and/or variant of uncertain significant result. We recommended Mr. Keenum pursue genetic testing for the CancerNext-Expanded+RNAinsight gene panel.   The CancerNext-Expanded gene panel offered by [facility] and includes sequencing and rearrangement analysis for the following 77 genes: AIP, ALK, APC*, ATM*, AXIN2, BAP1, BARD1, BLM, BMPR1A, BRCA1*, BRCA2*, BRIP1*, CDC73, CDH1*, CDK4, CDKN1B, CDKN2A, CHEK2*, CTNNA1, DICER1, FANCC, FH, FLCN, GALNT12, KIF1B, LZTR1, MAX, MEN1, MET, MLH1*, MSH2*, MSH3, MSH6*, MUTYH*, NBN, NF1*, NF2, NTHL1, PALB2*, PHOX2B, PMS2*, POT1, PRKAR1A, PTCH1, PTEN*, RAD51C*, RAD51D*, RB1, RECQL, RET, SDHA, SDHAF2, SDHB, SDHC, SDHD, SMAD4, SMARCA4, SMARCB1, SMARCE1, STK11, SUFU, TMEM127, TP53*, TSC1, TSC2,  VHL and XRCC2 (sequencing and deletion/duplication); EGFR, EGLN1, HOXB13, KIT, MITF, PDGFRA, POLD1, and POLE (sequencing only); EPCAM and GREM1 (deletion/duplication only). DNA and RNA analyses performed for * genes.   Based on Mr. Scheiber personal and family history of cancer, he meets medical criteria for genetic testing. Despite that he meets criteria, he may still have an out of pocket cost. We discussed that if his out of pocket cost for testing is over $100, the laboratory will call and confirm whether he wants to proceed with testing.  If the out of pocket cost of testing is less than $100 he will be billed by the genetic testing laboratory.   PLAN: After considering the risks, benefits, and limitations, Mr. Gerrard provided informed consent to pursue genetic testing and the blood sample was sent to [facility] for analysis of the CancerNext-Expanded+RNAinsight. Results should be available within approximately 2-3 weeks' time, at which point they will be disclosed by telephone to Mr. Granderson, as will any additional recommendations warranted by these results. Mr. Mcfarren will receive a summary of his genetic counseling visit and a copy of his results once available. This information will also be available in Epic.   Lastly, we encouraged Mr. Duman to remain in contact with cancer genetics annually so that we can continuously update the family history and inform him of any changes in cancer genetics and testing that may be of benefit for this family.   Mr. Beyl questions were answered to his satisfaction today. Our contact information was provided should additional questions or concerns arise. Thank you for the referral and allowing [place] to share in the care of your patient.   Jefferey Lippmann P. Florene Glen, [place], [facility] Licensed, Insurance risk surveyor [email or web address] phone: [phone]  The patient was seen for a total of 35 minutes in face-to-face genetic counseling.  The patient was seen with his wife.  This patient was discussed with Drs. Magrinat, Lindi Adie and/or Burr Medico who agrees with the above.    _______________________________________________________________________ For Office Staff:  Number of people involved in session: 2 Was an Intern/ student involved with case: no

## 2021-03-29 NOTE — Progress Notes (Deleted)
REFERRING PROVIDER: Derinda Late, MD [phone] [facility] [place],  [place] [address]  PRIMARY PROVIDER:  Derinda Late, MD  PRIMARY REASON FOR VISIT:  1. Family history of bladder cancer   2. Adenocarcinoma of rectum ([place])      HISTORY OF PRESENT ILLNESS:   Mr. Cauthorn, a 45 y.o. male, was seen for a  cancer genetics consultation at the request of Dr. Baldemar Lenis due to a {Personal/family:20331} history of {cancer/polyps}.  Mr. Boswell presents to clinic today to discuss the possibility of a hereditary predisposition to cancer, genetic testing, and to further clarify his future cancer risks, as well as potential cancer risks for family members.   In ***, at the age of ***, Mr. Kerstein was diagnosed with {CA PATHOLOGY:63853} of the {right left (wildcard):15202} {CA CWUGQ:91694}. The treatment plan ***.    *** Mr. Gershman is a 45 y.o. male with no personal history of cancer.    CANCER HISTORY:  Oncology History   No history exists.     RISK FACTORS:  Menarche was at age ***.  First live birth at age ***.  OCP use for approximately {Numbers 1-12 multi-select:20307} years.  Ovaries intact: {Yes/No-Ex:120004}.  Hysterectomy: {Yes/No-Ex:120004}.  Menopausal status: {Menopause:31378}.  HRT use: {Numbers 1-12 multi-select:20307} years. Colonoscopy: {Yes/No-Ex:120004}; {normal/abnormal/not examined:14677}. Mammogram within the last year: {Yes/No-Ex:120004}. Number of breast biopsies: {Numbers 1-12 multi-select:20307}. Up to date with pelvic exams: {Yes/No-Ex:120004}. Any excessive radiation exposure in the past: {Yes/No-Ex:120004}  Past Medical History:  Diagnosis Date   Anxiety    Family history of bladder cancer    Rectal cancer ([place]) 02/2021    Past Surgical History:  Procedure Laterality Date   NO PAST SURGERIES      Social History   Socioeconomic History   Marital status: Married    Spouse name: Not on file    Number of children: Not on file   Years of education: Not on file   Highest education level: Not on file  Occupational History   Not on file  Tobacco Use   Smoking status: Never   Smokeless tobacco: Never  Substance and Sexual Activity   Alcohol use: Yes    Comment: social-weekly   Drug use: Yes    Types: Marijuana    Comment: occ   Sexual activity: Not on file  Other Topics Concern   Not on file  Social History Narrative   Not on file   Social Determinants of Health   Financial Resource Strain: Not on file  Food Insecurity: Not on file  Transportation Needs: Not on file  Physical Activity: Not on file  Stress: Not on file  Social Connections: Not on file     FAMILY HISTORY:  We obtained a detailed, 4-generation family history.  Significant diagnoses are listed below: Family History  Problem Relation Age of Onset   Anxiety disorder Father    Healthy Brother    Bladder Cancer Maternal Grandmother 16   Healthy Half-Sister        paternal half   Colon cancer Neg Hx    Esophageal cancer Neg Hx    Stomach cancer Neg Hx    Pancreatic cancer Neg Hx    Liver disease Neg Hx    Inflammatory bowel disease Neg Hx     Mr. Semel is {aware/unaware} of previous family history of genetic testing for hereditary cancer risks. Patient's maternal ancestors are of *** descent, and paternal ancestors are of *** descent. There {  IS NO:12509} reported Ashkenazi Jewish ancestry. There {IS NO:12509} known consanguinity.  GENETIC COUNSELING ASSESSMENT: Mr. Hyams is a 45 y.o. male with a {Personal/family:20331} history of {cancer/polyps} which is somewhat suggestive of a {DISEASE} and predisposition to cancer given ***. We, therefore, discussed and recommended the following at today's visit.   DISCUSSION: We discussed that, in general, most cancer is not inherited in families, but instead is sporadic or familial. Sporadic cancers occur by chance and typically happen at older ages (>50 years)  as this type of cancer is caused by genetic changes acquired during an individual's lifetime. Some families have more cancers than would be expected by chance; however, the ages or types of cancer are not consistent with a known genetic mutation or known genetic mutations have been ruled out. This type of familial cancer is thought to be due to a combination of multiple genetic, environmental, hormonal, and lifestyle factors. While this combination of factors likely increases the risk of cancer, the exact source of this risk is not currently identifiable or testable.  We discussed that *** - ***% of *** is hereditary, with most cases associated with ***.  There are other genes that can be associated with hereditary *** cancer syndromes.  These include ***.  We discussed that testing is beneficial for several reasons including knowing how to follow individuals after completing their treatment, identifying whether potential treatment options such as PARP inhibitors would be beneficial, and understand if other family members could be at risk for cancer and allow them to undergo genetic testing.   We reviewed the characteristics, features and inheritance patterns of hereditary cancer syndromes. We also discussed genetic testing, including the appropriate family members to test, the process of testing, insurance coverage and turn-around-time for results. We discussed the implications of a negative, positive, carrier and/or variant of uncertain significant result. We recommended Mr. Kyser pursue genetic testing for the *** gene panel.   Based on Mr. Bibbee {Personal/family:20331} history of cancer, he meets medical criteria for genetic testing. Despite that he meets criteria, he may still have an out of pocket cost. We discussed that if his out of pocket cost for testing is over $100, the laboratory will call and confirm whether he wants to proceed with testing.  If the out of pocket cost of testing is less  than $100 he will be billed by the genetic testing laboratory.   ***We reviewed the characteristics, features and inheritance patterns of hereditary cancer syndromes. We also discussed genetic testing, including the appropriate family members to test, the process of testing, insurance coverage and turn-around-time for results. We discussed the implications of a negative, positive and/or variant of uncertain significant result. In order to get genetic test results in a timely manner so that Mr. Arizpe can use these genetic test results for surgical decisions, we recommended Mr. Tatar pursue genetic testing for the ***. Once complete, we recommend Mr. Carsten pursue reflex genetic testing to the *** gene panel.   Based on Mr. Magowan {Personal/family:20331} history of cancer, he meets medical criteria for genetic testing. Despite that he meets criteria, he may still have an out of pocket cost.   ***We discussed with Mr. Axel that the {Personal/family:20331} history does not meet insurance or NCCN criteria for genetic testing and, therefore, is not highly consistent with a familial hereditary cancer syndrome.  We feel he is at low risk to harbor a gene mutation associated with such a condition. Thus, we did not recommend any genetic testing, at this  time, and recommended Mr. Kina continue to follow the cancer screening guidelines given by his primary healthcare provider.  ***In order to estimate his chance of having a {CA GENE:62345} mutation, we used statistical models ({GENMODELS:62370}) that consider his personal medical history, family history and ancestry.  Because each model is different, there can be a lot of variability in the risks they give.  Therefore, these numbers must be considered a rough range and not a precise risk of having a {CA GENE:62345} mutation.  These models estimate that he has approximately a ***-***% chance of having a mutation. Based on this assessment of his family and  personal history, genetic testing {IS/ISNOT:34056} recommended.  ***Based on the patient's {Personal/family:20331} history, a statistical model ({GENMODELS:62370}) was used to estimate his risk of developing {CA HX:54794}. This estimates his lifetime risk of developing {CA HX:54794} to be approximately ***%. This estimation does not consider any genetic testing results.  The patient's lifetime breast cancer risk is a preliminary estimate based on available information using one of several models endorsed by the [place] (ACS). The ACS recommends consideration of breast MRI screening as an adjunct to mammography for patients at high risk (defined as 20% or greater lifetime risk). Please note that a woman's breast cancer risk changes over time. It may increase or decrease based on age and any changes to the personal and/or family medical history. The risks and recommendations listed above apply to this patient at this point in time. In the future, he may or may not be eligible for the same medical management strategies and, in some cases, other medical management strategies may become available to her. If he is interested in an updated breast cancer risk assessment at a later date, he can contact us.  ***Mr. Kroft has been determined to be at high risk for breast cancer.  Therefore, we recommend that annual screening with mammography and breast MRI be performed.  ***begin at age 32, or 10 years prior to the age of breast cancer diagnosis in a relative (whichever is earlier).  We discussed that Mr. Gervasi should discuss her individual situation with his referring physician and determine a breast cancer screening plan with which they are both comfortable.    PLAN: After considering the risks, benefits, and limitations, Mr. Donald provided informed consent to pursue genetic testing and the blood sample was sent to {Lab} Laboratories for analysis of the {test}. Results should be available within  approximately {TAT TIME} weeks' time, at which point they will be disclosed by telephone to Mr. Driggers, as will any additional recommendations warranted by these results. Mr. Minjares will receive a summary of his genetic counseling visit and a copy of his results once available. This information will also be available in Epic.   *** Despite our recommendation, Mr. Kunkle did not wish to pursue genetic testing at today's visit. We understand this decision and remain available to coordinate genetic testing at any time in the future. We, therefore, recommend Mr. Ashe continue to follow the cancer screening guidelines given by his primary healthcare provider.  ***Based on Mr. Majeed family history, we recommended his ***, who was diagnosed with *** at age ***, have genetic counseling and testing. Mr. Stamas will let us know if we can be of any assistance in coordinating genetic counseling and/or testing for this family member.   Lastly, we encouraged Mr. Mccormac to remain in contact with cancer genetics annually so that we can continuously update the family history and inform him  of any changes in cancer genetics and testing that may be of benefit for this family.   Mr. Chamberlin questions were answered to his satisfaction today. Our contact information was provided should additional questions or concerns arise. Thank you for the referral and allowing [place] to share in the care of your patient.   Aveline Daus P. Florene Glen, [place], [facility] Licensed, Insurance risk surveyor [email or web address] phone: [phone]  The patient was seen for a total of *** minutes in face-to-face genetic counseling.  *** The patient was seen alone.  ***The patient brought *** This patient was discussed with Drs. Magrinat, Lindi Adie and/or Burr Medico who agrees with the above.    _______________________________________________________________________ For Office Staff:  Number of people involved in session: *** Was an Intern/  student involved with case: {YES/NO:63}

## 2021-03-29 NOTE — Progress Notes (Signed)
I met with Mr and Mrs Mccullers after his consultation with Dr Feng.  I explained my role as oncology nurse navigator and provided my contact information. I briefly reviewed the services available in the [facility].  All questions were answered.  They verbalized understanding 

## 2021-03-29 NOTE — Progress Notes (Signed)
The proposed treatment discussed in conference is for discussion purpose only and is not a binding recommendation.  The patients have not been physically examined, or presented with their treatment options.  Therefore, final treatment plans cannot be decided.  

## 2021-03-29 NOTE — Progress Notes (Signed)
[place]   [phone] [phone]   Clinic New Consult Note   Patient Care Team: Derinda Late, MD as PCP - General (Family Medicine)  Date of Service:  03/29/2021   CHIEF COMPLAINTS/PURPOSE OF CONSULTATION:  Rectal Adenocarcinoma  REFERRING PHYSICIAN:  Dr. [name]  ASSESSMENT & PLAN:  Jerry Allison is a 45 y.o.  male   1. Adenocarcinoma of proximal rectum, [id number] -initially presented with worsening hematochezia. He was referred to Dr. [name] and proceeded to colonoscopy on 03/07/21 showing a partially-obstructing tumor in proximal rectum. Biopsy confirmed adenocarcinoma and a tubular adenoma polyp. -baseline CEA that day was normal at 1.7. -staging CT CAP 03/09/21 was negative for adenopathy or metastatic disease. -pelvic MRI 03/27/21 was also negative for nodal metastasis and showed new trace perisigmoid fluid. -I reviewed the work up thus far with the patient and his wife. At this point, based on Dr. Orest Dikes physical exam (he was able to feel the tumor at the tip of his finger on rectal exam) and MRI scan images, we believe his tumor is mainly in proximal rectum, extending to sigmoid colon.  I explained that his MRI was incomplete due to contrast not going up high enough in the proximal rectum and sigmoid colon, so we are not able to define the tumor stage bu MRI. The recommendation from our tumor board is to proceed with EUS to further evaluate T and N stage of the tumor, to determine if he needs neoadjuvant chemotherapy and radiation. He expressed some frustration with not having the answers yet despite all these tests. After a lengthy discussion, he agrees to proceed with EUS. -I discussed the various treatment plans for rectal and colon cancers, depends on the staging.  If he has T3N0 disease, I recommend neoadjuvant Xeloda with concurrent radiation.  If he has T4 or positive lymph nodes, I recommended total neoadjuvant with 6 cycles of  Capox followed by chemoRT.  -I recommend obtaining baseline labs today. He is already scheduled for blood draw for genetics, and I will add to that. -I will refer him to Dr. Ardis Hughs or Dr. Rush Landmark to proceed with EUS.  2. Symptom Management: epigastric pain, ?constipation, hematochezia -he reports constant epigastric pain since MRI on 10/31 -I recommend he start miralax to keep his bowels regular, especially on oral iron.  3. Mild Anemia -most recent CBC from 02/20/21 showed hgb 11.7. I will order repeat CBC and iron panel to be done today. I recommend he begin oral iron; I advised him this could cause constipation.   PLAN:  -proceed to lab today -referral back to GI for EUS, will call him after EUS to determine his treatment plan    Oncology History  Adenocarcinoma of rectum ([place])  03/07/2021 Procedure   Colonoscopy, Dr. [name]  Impression - Malignant appearing partially obstructing tumor in the proximal rectum. Biopsied. Tattooed. - One 3 mm polyp in the distal rectum, removed with a cold snare. Resected and retrieved. - The examined portion of the ileum was normal. - The distal rectum and anal verge are normal on retroflexion view.   03/07/2021 Pathology Results   Diagnosis 1. Rectum, biopsy, mass - ADENOCARCINOMA - SEE COMMENT 2. Rectum, biopsy, polyp (1) - TUBULAR ADENOMA (1 OF 1 FRAGMENTS) - NO HIGH-GRADE DYSPLASIA OR MALIGNANCY IDENTIFIED   03/09/2021 Imaging   CT CAP  IMPRESSION: 1. Asymmetric wall thickening in the proximal/mid rectum may correspond to mass seen on endoscopy. 2. Negative for adenopathy or evidence of distal metastatic  disease.   03/27/2021 Imaging   MRI Pelvis  IMPRESSION: 1. Low sigmoid primary, greater than 15 cm from the anal verge, as detailed above. Poorly delineated secondary to underdistention in this region. 2. No evidence of pelvic nodal metastasis. 3. Trace perisigmoid fluid, new since the prior CT.   03/28/2021 Initial  Diagnosis   Adenocarcinoma of rectum ([facility])      HISTORY OF PRESENTING ILLNESS:  Jerry Allison 45 y.o. male is a here because of rectal cancer. The patient was referred by Dr. [name]. The patient presents to the clinic today accompanied by his wife.   He initially presented with increasing hematochezia. He had initially noted it intermittently over the past year, and he attributed it to hemorrhoids. He notes he had issues with insurance and was unable to proceed with work up previously. However, it got progressively worse over summer 2022. He was referred to Dr. [name] on 02/20/21; stool test from that day was negative for fecal lactoferrin.   Today the patient notes they felt/feeling prior/after... -he reports daily blood-- in his stool, in the toilet bowl, and on the toilet tissue-- and rectal spasms.  -he reports abdominal pain, which has worsened since he underwent MRI. -he also reports intermittent feeling of constipation, denies taking laxative at this point. -he denies change in his energy level, only the past weekend which he attributes to recent diagnosis.   He has a PMHx of.... -anxiety, on lexapro for several years   Socially... -married with three children -he currently works in a warehouse -he reports possible bladder cancer in a maternal grandparent, not sure. No other family history of cancer.   REVIEW OF SYSTEMS:    Constitutional: Denies fevers, chills or abnormal night sweats Eyes: Denies blurriness of vision, double vision or watery eyes Ears, nose, mouth, throat, and face: Denies mucositis or sore throat Respiratory: Denies cough, dyspnea or wheezes Cardiovascular: Denies palpitation, chest discomfort or lower extremity swelling Gastrointestinal:  Denies nausea, heartburn or change in bowel habits, (+) epigastric pain Skin: Denies abnormal skin rashes Lymphatics: Denies new lymphadenopathy or easy bruising Neurological:Denies numbness, tingling or new  weaknesses Behavioral/Psych: Mood is stable, no new changes  All other systems were reviewed with the patient and are negative.   MEDICAL HISTORY:  Past Medical History:  Diagnosis Date   Anxiety    Family history of bladder cancer    Rectal cancer ([place]) 02/2021    SURGICAL HISTORY: Past Surgical History:  Procedure Laterality Date   NO PAST SURGERIES      SOCIAL HISTORY: Social History   Socioeconomic History   Marital status: Married    Spouse name: Not on file   Number of children: Not on file   Years of education: Not on file   Highest education level: Not on file  Occupational History   Not on file  Tobacco Use   Smoking status: Never   Smokeless tobacco: Never  Substance and Sexual Activity   Alcohol use: Yes    Comment: social-weekly   Drug use: Yes    Types: Marijuana    Comment: occ   Sexual activity: Not on file  Other Topics Concern   Not on file  Social History Narrative   Not on file   Social Determinants of Health   Financial Resource Strain: Not on file  Food Insecurity: Not on file  Transportation Needs: Not on file  Physical Activity: Not on file  Stress: Not on file  Social Connections: Not on file  Intimate Partner Violence: Not At Risk   Fear of Current or Ex-Partner: No   Emotionally Abused: No   Physically Abused: No   Sexually Abused: No    FAMILY HISTORY: Family History  Problem Relation Age of Onset   Anxiety disorder Father    Healthy Brother    Bladder Cancer Maternal Grandmother 37   Healthy Half-Sister        paternal half   Colon cancer Neg Hx    Esophageal cancer Neg Hx    Stomach cancer Neg Hx    Pancreatic cancer Neg Hx    Liver disease Neg Hx    Inflammatory bowel disease Neg Hx     ALLERGIES:  is allergic to shellfish allergy.  MEDICATIONS:  Current Outpatient Medications  Medication Sig Dispense Refill   dicyclomine (BENTYL) 20 MG tablet Take 1 tablet (20 mg total) by mouth 3 (three) times daily as  needed for spasms. 60 tablet 1   escitalopram (LEXAPRO) 10 MG tablet Take 5 mg by mouth daily.     No current facility-administered medications for this visit.    PHYSICAL EXAMINATION: ECOG PERFORMANCE STATUS: 0 - Asymptomatic  There were no vitals filed for this visit. There were no vitals filed for this visit.  GENERAL:alert, no distress and comfortable SKIN: skin color, texture, turgor are normal, no rashes or significant lesions EYES: normal, Conjunctiva are pink and non-injected, sclera clear  LUNGS: clear to auscultation and percussion with normal breathing effort HEART: regular rate & rhythm and no murmurs and no lower extremity edema ABDOMEN:abdomen soft, (+) mild tenderness, (+) low bowel sounds Musculoskeletal:no cyanosis of digits and no clubbing, pt declined rectal exam today  NEURO: alert & oriented x 3 with fluent speech, no focal motor/sensory deficits  LABORATORY DATA:  I have reviewed the data as listed CBC Latest Ref Rng & Units 02/20/2021 01/09/2021  WBC 4.0 - 10.5 K/uL 6.2 6.3  Hemoglobin 13.0 - 17.0 g/dL 11.7(L) 13.4  Hematocrit 39.0 - 52.0 % 35.9(L) 41.8  Platelets 150.0 - 400.0 K/uL 367.0 325    CMP Latest Ref Rng & Units 01/09/2021  Glucose 70 - 99 mg/dL 91  BUN 6 - 20 mg/dL 16  Creatinine 0.61 - 1.24 mg/dL 1.22  Sodium 135 - 145 mmol/L 139  Potassium 3.5 - 5.1 mmol/L 4.0  Chloride 98 - 111 mmol/L 108  CO2 22 - 32 mmol/L 25  Calcium 8.9 - 10.3 mg/dL 9.0  Total Protein 6.5 - 8.1 g/dL 6.7  Total Bilirubin 0.3 - 1.2 mg/dL 0.4  Alkaline Phos 38 - 126 U/L 42  AST 15 - 41 U/L 25  ALT 0 - 44 U/L 29     RADIOGRAPHIC STUDIES: I have personally reviewed the radiological images as listed and agreed with the findings in the report. MR PELVIS WO CONTRAST  Result Date: 03/28/2021 CLINICAL DATA:  Proximal rectal mass on colonoscopy. At 11-15 cm from anal verge. EXAM: MRI PELVIS WITHOUT CONTRAST TECHNIQUE: Multiplanar multisequence MR imaging of the pelvis was  performed. No intravenous contrast was administered. Ultrasound gel was administered per rectum to optimize tumor evaluation. COMPARISON:  Colonoscopy report of 03/07/2021 FINDINGS: TUMOR LOCATION Tumor distance from Anal Verge/Skin Surface: Approximately 15 cm, including on 21/3. This is most consistent with a low sigmoid primary. TUMOR DESCRIPTION Circumferential Extent: Circumferential, including on 41/6. Tumor Length: Suboptimally delineated secondary to underdistention within the sigmoid. Based on restricted diffusion, on the order of 5.6 cm on 43/8. Circumferential low sigmoid mass, as detailed above.  No surrounding lymphadenopathy. There are small nodes in this surrounding mesocolon, including on 12/07. Trace fluid within the pelvis and adjacent the sigmoid, including on 05/04. New since the prior CT. No pelvic sidewall adenopathy. Normal urinary bladder and prostate.  No bowel obstruction. IMPRESSION: 1. Low sigmoid primary, greater than 15 cm from the anal verge, as detailed above. Poorly delineated secondary to underdistention in this region. 2. No evidence of pelvic nodal metastasis. 3. Trace perisigmoid fluid, new since the prior CT. Electronically Signed   By: Abigail Miyamoto M.D.   On: 03/28/2021 09:52   CT CHEST ABDOMEN PELVIS W CONTRAST  Result Date: 03/13/2021 CLINICAL DATA:  Rectal mass, hematochezia EXAM: CT CHEST, ABDOMEN, AND PELVIS WITH CONTRAST TECHNIQUE: Multidetector CT imaging of the chest, abdomen and pelvis was performed following the standard protocol during bolus administration of intravenous contrast. CONTRAST:  163mL OMNIPAQUE IOHEXOL 350 MG/ML SOLN COMPARISON:  100 mL Omnipaque 350 IV FINDINGS: CT CHEST FINDINGS Cardiovascular: Heart size normal.  No pericardial effusion. Mediastinum/Nodes: No mass or adenopathy. Lungs/Pleura: No pleural effusion.  No pneumothorax.  Lungs clear. Musculoskeletal: No chest wall mass or suspicious bone lesions identified. CT ABDOMEN PELVIS FINDINGS  Hepatobiliary: No focal liver abnormality is seen. No gallstones, gallbladder wall thickening, or biliary dilatation. Pancreas: Unremarkable. No pancreatic ductal dilatation or surrounding inflammatory changes. Spleen: Normal in size without focal abnormality. Adrenals/Urinary Tract: Adrenal glands unremarkable. 2.6 cm probable benign cyst, superior left kidney. No urolithiasis or hydronephrosis. Urinary bladder incompletely distended. Stomach/Bowel: Stomach is incompletely distended, unremarkable. Small bowel is decompressed with good distal passage of oral contrast material. Normal appendix. The colon is nondilated. There is asymmetric wall thickening in the mid rectum which may correlate with the mass seen on colonoscopy. No evidence of perforation or abscess. Vascular/Lymphatic: No perirectal or mesenteric adenopathy. No retroperitoneal or pelvic adenopathy. Reproductive: Prostate is unremarkable. Other: Bilateral pelvic phleboliths. No ascites. No free air. No peritoneal mass. Musculoskeletal: Mild bilateral hip DJD. No fracture or worrisome bone lesion. IMPRESSION: 1. Asymmetric wall thickening in the proximal/mid rectum may correspond to mass seen on endoscopy. 2. Negative for adenopathy or evidence of distal metastatic disease. Electronically Signed   By: Lucrezia Europe M.D.   On: 03/13/2021 05:19     No orders of the defined types were placed in this encounter.   All questions were answered. The patient knows to call the clinic with any problems, questions or concerns. The total time spent in the appointment was 50 minutes.     [name], MD 03/29/2021   I, Wilburn Mylar, am acting as scribe for [name], MD.   I have reviewed the above documentation for accuracy and completeness, and I agree with the above.

## 2021-03-29 NOTE — H&P (View-Only) (Signed)
[place]   [phone] [phone]   Clinic New Consult Note   Patient Care Team: Derinda Late, MD as PCP - General (Family Medicine)  Date of Service:  03/29/2021   CHIEF COMPLAINTS/PURPOSE OF CONSULTATION:  Rectal Adenocarcinoma  REFERRING PHYSICIAN:  Dr. [name]  ASSESSMENT & PLAN:  Jerry Allison is a 45 y.o.  male   1. Adenocarcinoma of proximal rectum, [id number] -initially presented with worsening hematochezia. He was referred to Dr. [name] and proceeded to colonoscopy on 03/07/21 showing a partially-obstructing tumor in proximal rectum. Biopsy confirmed adenocarcinoma and a tubular adenoma polyp. -baseline CEA that day was normal at 1.7. -staging CT CAP 03/09/21 was negative for adenopathy or metastatic disease. -pelvic MRI 03/27/21 was also negative for nodal metastasis and showed new trace perisigmoid fluid. -I reviewed the work up thus far with the patient and his wife. At this point, based on Dr. Orest Dikes physical exam (he was able to feel the tumor at the tip of his finger on rectal exam) and MRI scan images, we believe his tumor is mainly in proximal rectum, extending to sigmoid colon.  I explained that his MRI was incomplete due to contrast not going up high enough in the proximal rectum and sigmoid colon, so we are not able to define the tumor stage bu MRI. The recommendation from our tumor board is to proceed with EUS to further evaluate T and N stage of the tumor, to determine if he needs neoadjuvant chemotherapy and radiation. He expressed some frustration with not having the answers yet despite all these tests. After a lengthy discussion, he agrees to proceed with EUS. -I discussed the various treatment plans for rectal and colon cancers, depends on the staging.  If he has T3N0 disease, I recommend neoadjuvant Xeloda with concurrent radiation.  If he has T4 or positive lymph nodes, I recommended total neoadjuvant with 6 cycles of  Capox followed by chemoRT.  -I recommend obtaining baseline labs today. He is already scheduled for blood draw for genetics, and I will add to that. -I will refer him to Dr. Ardis Hughs or Dr. Rush Landmark to proceed with EUS.  2. Symptom Management: epigastric pain, ?constipation, hematochezia -he reports constant epigastric pain since MRI on 10/31 -I recommend he start miralax to keep his bowels regular, especially on oral iron.  3. Mild Anemia -most recent CBC from 02/20/21 showed hgb 11.7. I will order repeat CBC and iron panel to be done today. I recommend he begin oral iron; I advised him this could cause constipation.   PLAN:  -proceed to lab today -referral back to GI for EUS, will call him after EUS to determine his treatment plan    Oncology History  Adenocarcinoma of rectum (Jerry Allison)  03/07/2021 Procedure   Colonoscopy, Dr. [name]  Impression - Malignant appearing partially obstructing tumor in the proximal rectum. Biopsied. Tattooed. - One 3 mm polyp in the distal rectum, removed with a cold snare. Resected and retrieved. - The examined portion of the ileum was normal. - The distal rectum and anal verge are normal on retroflexion view.   03/07/2021 Pathology Results   Diagnosis 1. Rectum, biopsy, mass - ADENOCARCINOMA - SEE COMMENT 2. Rectum, biopsy, polyp (1) - TUBULAR ADENOMA (1 OF 1 FRAGMENTS) - NO HIGH-GRADE DYSPLASIA OR MALIGNANCY IDENTIFIED   03/09/2021 Imaging   CT CAP  IMPRESSION: 1. Asymmetric wall thickening in the proximal/mid rectum may correspond to mass seen on endoscopy. 2. Negative for adenopathy or evidence of distal metastatic  disease.   03/27/2021 Imaging   MRI Pelvis  IMPRESSION: 1. Low sigmoid primary, greater than 15 cm from the anal verge, as detailed above. Poorly delineated secondary to underdistention in this region. 2. No evidence of pelvic nodal metastasis. 3. Trace perisigmoid fluid, new since the prior CT.   03/28/2021 Initial  Diagnosis   Adenocarcinoma of rectum ([facility]CC)      HISTORY OF PRESENTING ILLNESS:  Jerry Allison 45 y.o. male is a here because of rectal cancer. The patient was referred by Dr. [name]. The patient presents to the clinic today accompanied by his wife.   He initially presented with increasing hematochezia. He had initially noted it intermittently over the past year, and he attributed it to hemorrhoids. He notes he had issues with insurance and was unable to proceed with work up previously. However, it got progressively worse over summer 2022. He was referred to Dr. [name] on 02/20/21; stool test from that day was negative for fecal lactoferrin.   Today the patient notes they felt/feeling prior/after... -he reports daily blood-- in his stool, in the toilet bowl, and on the toilet tissue-- and rectal spasms.  -he reports abdominal pain, which has worsened since he underwent MRI. -he also reports intermittent feeling of constipation, denies taking laxative at this point. -he denies change in his energy level, only the past weekend which he attributes to recent diagnosis.   He has a PMHx of.... -anxiety, on lexapro for several years   Socially... -married with three children -he currently works in a warehouse -he reports possible bladder cancer in a maternal grandparent, not sure. No other family history of cancer.   REVIEW OF SYSTEMS:    Constitutional: Denies fevers, chills or abnormal night sweats Eyes: Denies blurriness of vision, double vision or watery eyes Ears, nose, mouth, throat, and face: Denies mucositis or sore throat Respiratory: Denies cough, dyspnea or wheezes Cardiovascular: Denies palpitation, chest discomfort or lower extremity swelling Gastrointestinal:  Denies nausea, heartburn or change in bowel habits, (+) epigastric pain Skin: Denies abnormal skin rashes Lymphatics: Denies new lymphadenopathy or easy bruising Neurological:Denies numbness, tingling or new  weaknesses Behavioral/Psych: Mood is stable, no new changes  All other systems were reviewed with the patient and are negative.   MEDICAL HISTORY:  Past Medical History:  Diagnosis Date   Anxiety    Family history of bladder cancer    Rectal cancer ([place]) 02/2021    SURGICAL HISTORY: Past Surgical History:  Procedure Laterality Date   NO PAST SURGERIES      SOCIAL HISTORY: Social History   Socioeconomic History   Marital status: Married    Spouse name: Not on file   Number of children: Not on file   Years of education: Not on file   Highest education level: Not on file  Occupational History   Not on file  Tobacco Use   Smoking status: Never   Smokeless tobacco: Never  Substance and Sexual Activity   Alcohol use: Yes    Comment: social-weekly   Drug use: Yes    Types: Marijuana    Comment: occ   Sexual activity: Not on file  Other Topics Concern   Not on file  Social History Narrative   Not on file   Social Determinants of Health   Financial Resource Strain: Not on file  Food Insecurity: Not on file  Transportation Needs: Not on file  Physical Activity: Not on file  Stress: Not on file  Social Connections: Not on file  Intimate Partner Violence: Not At Risk   Fear of Current or Ex-Partner: No   Emotionally Abused: No   Physically Abused: No   Sexually Abused: No    FAMILY HISTORY: Family History  Problem Relation Age of Onset   Anxiety disorder Father    Healthy Brother    Bladder Cancer Maternal Grandmother 20   Healthy Half-Sister        paternal half   Colon cancer Neg Hx    Esophageal cancer Neg Hx    Stomach cancer Neg Hx    Pancreatic cancer Neg Hx    Liver disease Neg Hx    Inflammatory bowel disease Neg Hx     ALLERGIES:  is allergic to shellfish allergy.  MEDICATIONS:  Current Outpatient Medications  Medication Sig Dispense Refill   dicyclomine (BENTYL) 20 MG tablet Take 1 tablet (20 mg total) by mouth 3 (three) times daily as  needed for spasms. 60 tablet 1   escitalopram (LEXAPRO) 10 MG tablet Take 5 mg by mouth daily.     No current facility-administered medications for this visit.    PHYSICAL EXAMINATION: ECOG PERFORMANCE STATUS: 0 - Asymptomatic  There were no vitals filed for this visit. There were no vitals filed for this visit.  GENERAL:alert, no distress and comfortable SKIN: skin color, texture, turgor are normal, no rashes or significant lesions EYES: normal, Conjunctiva are pink and non-injected, sclera clear  LUNGS: clear to auscultation and percussion with normal breathing effort HEART: regular rate & rhythm and no murmurs and no lower extremity edema ABDOMEN:abdomen soft, (+) mild tenderness, (+) low bowel sounds Musculoskeletal:no cyanosis of digits and no clubbing, pt declined rectal exam today  NEURO: alert & oriented x 3 with fluent speech, no focal motor/sensory deficits  LABORATORY DATA:  I have reviewed the data as listed CBC Latest Ref Rng & Units 02/20/2021 01/09/2021  WBC 4.0 - 10.5 K/uL 6.2 6.3  Hemoglobin 13.0 - 17.0 g/dL 11.7(L) 13.4  Hematocrit 39.0 - 52.0 % 35.9(L) 41.8  Platelets 150.0 - 400.0 K/uL 367.0 325    CMP Latest Ref Rng & Units 01/09/2021  Glucose 70 - 99 mg/dL 91  BUN 6 - 20 mg/dL 16  Creatinine 0.61 - 1.24 mg/dL 1.22  Sodium 135 - 145 mmol/L 139  Potassium 3.5 - 5.1 mmol/L 4.0  Chloride 98 - 111 mmol/L 108  CO2 22 - 32 mmol/L 25  Calcium 8.9 - 10.3 mg/dL 9.0  Total Protein 6.5 - 8.1 g/dL 6.7  Total Bilirubin 0.3 - 1.2 mg/dL 0.4  Alkaline Phos 38 - 126 U/L 42  AST 15 - 41 U/L 25  ALT 0 - 44 U/L 29     RADIOGRAPHIC STUDIES: I have personally reviewed the radiological images as listed and agreed with the findings in the report. MR PELVIS WO CONTRAST  Result Date: 03/28/2021 CLINICAL DATA:  Proximal rectal mass on colonoscopy. At 11-15 cm from anal verge. EXAM: MRI PELVIS WITHOUT CONTRAST TECHNIQUE: Multiplanar multisequence MR imaging of the pelvis was  performed. No intravenous contrast was administered. Ultrasound gel was administered per rectum to optimize tumor evaluation. COMPARISON:  Colonoscopy report of 03/07/2021 FINDINGS: TUMOR LOCATION Tumor distance from Anal Verge/Skin Surface: Approximately 15 cm, including on 21/3. This is most consistent with a low sigmoid primary. TUMOR DESCRIPTION Circumferential Extent: Circumferential, including on 41/6. Tumor Length: Suboptimally delineated secondary to underdistention within the sigmoid. Based on restricted diffusion, on the order of 5.6 cm on 43/8. Circumferential low sigmoid mass, as detailed above.  No surrounding lymphadenopathy. There are small nodes in this surrounding mesocolon, including on 12/07. Trace fluid within the pelvis and adjacent the sigmoid, including on 05/04. New since the prior CT. No pelvic sidewall adenopathy. Normal urinary bladder and prostate.  No bowel obstruction. IMPRESSION: 1. Low sigmoid primary, greater than 15 cm from the anal verge, as detailed above. Poorly delineated secondary to underdistention in this region. 2. No evidence of pelvic nodal metastasis. 3. Trace perisigmoid fluid, new since the prior CT. Electronically Signed   By: Abigail Miyamoto M.D.   On: 03/28/2021 09:52   CT CHEST ABDOMEN PELVIS W CONTRAST  Result Date: 03/13/2021 CLINICAL DATA:  Rectal mass, hematochezia EXAM: CT CHEST, ABDOMEN, AND PELVIS WITH CONTRAST TECHNIQUE: Multidetector CT imaging of the chest, abdomen and pelvis was performed following the standard protocol during bolus administration of intravenous contrast. CONTRAST:  171mL OMNIPAQUE IOHEXOL 350 MG/ML SOLN COMPARISON:  100 mL Omnipaque 350 IV FINDINGS: CT CHEST FINDINGS Cardiovascular: Heart size normal.  No pericardial effusion. Mediastinum/Nodes: No mass or adenopathy. Lungs/Pleura: No pleural effusion.  No pneumothorax.  Lungs clear. Musculoskeletal: No chest wall mass or suspicious bone lesions identified. CT ABDOMEN PELVIS FINDINGS  Hepatobiliary: No focal liver abnormality is seen. No gallstones, gallbladder wall thickening, or biliary dilatation. Pancreas: Unremarkable. No pancreatic ductal dilatation or surrounding inflammatory changes. Spleen: Normal in size without focal abnormality. Adrenals/Urinary Tract: Adrenal glands unremarkable. 2.6 cm probable benign cyst, superior left kidney. No urolithiasis or hydronephrosis. Urinary bladder incompletely distended. Stomach/Bowel: Stomach is incompletely distended, unremarkable. Small bowel is decompressed with good distal passage of oral contrast material. Normal appendix. The colon is nondilated. There is asymmetric wall thickening in the mid rectum which may correlate with the mass seen on colonoscopy. No evidence of perforation or abscess. Vascular/Lymphatic: No perirectal or mesenteric adenopathy. No retroperitoneal or pelvic adenopathy. Reproductive: Prostate is unremarkable. Other: Bilateral pelvic phleboliths. No ascites. No free air. No peritoneal mass. Musculoskeletal: Mild bilateral hip DJD. No fracture or worrisome bone lesion. IMPRESSION: 1. Asymmetric wall thickening in the proximal/mid rectum may correspond to mass seen on endoscopy. 2. Negative for adenopathy or evidence of distal metastatic disease. Electronically Signed   By: Lucrezia Europe M.D.   On: 03/13/2021 05:19     No orders of the defined types were placed in this encounter.   All questions were answered. The patient knows to call the clinic with any problems, questions or concerns. The total time spent in the appointment was 50 minutes.     [name], MD 03/29/2021   I, Wilburn Mylar, am acting as scribe for [name], MD.   I have reviewed the above documentation for accuracy and completeness, and I agree with the above.

## 2021-04-02 ENCOUNTER — Encounter: Payer: Self-pay | Admitting: Hematology

## 2021-04-03 ENCOUNTER — Other Ambulatory Visit: Payer: Self-pay

## 2021-04-03 ENCOUNTER — Telehealth: Payer: Self-pay

## 2021-04-03 DIAGNOSIS — K6289 Other specified diseases of anus and rectum: Secondary | ICD-10-CM

## 2021-04-03 DIAGNOSIS — C20 Malignant neoplasm of rectum: Secondary | ICD-10-CM

## 2021-04-03 NOTE — Telephone Encounter (Signed)
The pt has been scheduled for lower EUS on 12/1 at 12 noon.  Instructions have been mailed to the pt home.  I have spoken with the pt and we discussed the instructions.  All questions answered to the best of my ability.  He will call back if he has any questions after receiving and reviewing the mailed information.

## 2021-04-03 NOTE — Telephone Encounter (Signed)
-----   Message from [name], RN sent at 04/03/2021 12:52 PM EST ----- Chong Sicilian,  I don't think Dr Ardis Hughs sent this message toyou.  KB ----- Message ----- From: [name], MD Sent: 04/03/2021   7:35 AM EST To: Ileana Roup, MD, [name], MD, #  Got it, thanks  Jerry Allison, he needs lower endoscopic ultrasound for rectal cancer staging.  Offer him my first available spot which is Thursday, December 1.  Certainly if gave has sooner availability that would be great.  Thank you   DJ ----- Message ----- From: [name], MD Sent: 04/02/2021   9:45 AM EST To: [name], MD, Ileana Roup, MD, #  [facility] Linna Hoff and Chester Holstein,  This is the guy we discussed in GI conference last week with a proximal rectal cancer, unfortunately MRI was not able to give [place] T stage due to limited contrast. Could you get him in for EUS for staging (and evaluate his tumor location again) ASAP, so we can decide if he needs neoadjuvant treatment?  Thanks much  [employer]

## 2021-04-03 NOTE — Telephone Encounter (Signed)
Dr Ardis Hughs if you do the case on 12/1 it will not start until 12 noon is that ok?

## 2021-04-07 ENCOUNTER — Telehealth: Payer: Self-pay

## 2021-04-07 NOTE — Telephone Encounter (Signed)
-----   Message from Timothy Lasso, RN sent at 04/07/2021 11:20 AM EST ----- Left message on patient voice mail to call back if he can do 11/25. ----- Message ----- From: [name], MD Sent: 04/07/2021  10:54 AM EST To: Ileana Roup, MD, Timothy Lasso, RN, #  Not yet, but I just opened up another day (the Friday after thanksgiving) and we'll offer him a spot that day.  Chong Sicilian, can you see if he can do Friday November 25th to follow the ERCP that is at 7:30?   ----- Message ----- From: [name], RN Sent: 04/07/2021  10:07 AM EST To: [name], MD, Ileana Roup, MD, #  Good Morning,  Dr Burr Medico wanted me to see if you have had any luck scheduling this patient's lower EUS before 12/1?    Thank you  KB ----- Message ----- From: Timothy Lasso, RN Sent: 04/03/2021   1:02 PM EST To: [name], RN  Thank you   ----- Message ----- From: [name], RN Sent: 04/03/2021  12:53 PM EST To: Timothy Lasso, RN  Trenesha Alcaide,  I don't think Dr Ardis Hughs sent this message toyou.  KB ----- Message ----- From: [name], MD Sent: 04/03/2021   7:35 AM EST To: Ileana Roup, MD, [name], MD, #  Got it, thanks  Claudine Stallings, he needs lower endoscopic ultrasound for rectal cancer staging.  Offer him my first available spot which is Thursday, December 1.  Certainly if gave has sooner availability that would be great.  Thank you   DJ ----- Message ----- From: [name], MD Sent: 04/02/2021   9:45 AM EST To: [name], MD, Ileana Roup, MD, #  [name] Linna Hoff and Chester Holstein,  This is the guy we discussed in GI conference last week with a proximal rectal cancer, unfortunately MRI was not able to give [place] T stage due to limited contrast. Could you get him in for EUS for staging (and evaluate his tumor location again) ASAP, so we can decide if he needs neoadjuvant treatment?  Thanks much  [employer]

## 2021-04-10 ENCOUNTER — Telehealth: Payer: Self-pay | Admitting: *Deleted

## 2021-04-10 NOTE — Telephone Encounter (Signed)
-----   Message from [name], MD sent at 04/09/2021  2:02 PM EST ----- Please let pt know that his iron level is very low, I recommend OTC ferrous sulfate 1 tab twice daily with vitC or orange juice, thanks   [name]  04/09/2021

## 2021-04-10 NOTE — Telephone Encounter (Signed)
Per Dr.Feng, called pt with message below. Pt verbalized understanding.

## 2021-04-11 ENCOUNTER — Other Ambulatory Visit: Payer: Self-pay

## 2021-04-11 ENCOUNTER — Encounter (HOSPITAL_COMMUNITY): Payer: Self-pay | Admitting: Gastroenterology

## 2021-04-11 NOTE — Progress Notes (Signed)
I spoke with Jerry Allison.  He is available for a phone visit with Dr Burr Medico on 12/2 at 1620.

## 2021-04-13 ENCOUNTER — Telehealth: Payer: Self-pay

## 2021-04-13 ENCOUNTER — Ambulatory Visit: Payer: Self-pay | Admitting: Genetic Counselor

## 2021-04-13 ENCOUNTER — Telehealth: Payer: Self-pay | Admitting: Genetic Counselor

## 2021-04-13 ENCOUNTER — Encounter: Payer: Self-pay | Admitting: Genetic Counselor

## 2021-04-13 DIAGNOSIS — Z1379 Encounter for other screening for genetic and chromosomal anomalies: Secondary | ICD-10-CM | POA: Insufficient documentation

## 2021-04-13 DIAGNOSIS — C20 Malignant neoplasm of rectum: Secondary | ICD-10-CM

## 2021-04-13 NOTE — Telephone Encounter (Signed)
Called patient and let him know to come pick up paper work on the 3rd.

## 2021-04-13 NOTE — Telephone Encounter (Signed)
Revealed negative genetic testing.  Discussed that we do not know why he has rectal cancer or why there is cancer in the family. It could be due to a different gene that we are not testing, or maybe our current technology may not be able to pick something up.  It will be important for him to keep in contact with genetics to keep up with whether additional testing may be needed.

## 2021-04-13 NOTE — Progress Notes (Signed)
HPI:  Mr. Kempner was previously seen in the [facility] due to a personal and family history of cancer and concerns regarding a hereditary predisposition to cancer. Please refer to our prior cancer genetics clinic note for more information regarding our discussion, assessment and recommendations, at the time. Mr. Dennington recent genetic test results were disclosed to him, as were recommendations warranted by these results. These results and recommendations are discussed in more detail below.  CANCER HISTORY:  Oncology History  Adenocarcinoma of rectum ([place])  03/07/2021 Procedure   Colonoscopy, Dr. [name]  Impression - Malignant appearing partially obstructing tumor in the proximal rectum. Biopsied. Tattooed. - One 3 mm polyp in the distal rectum, removed with a cold snare. Resected and retrieved. - The examined portion of the ileum was normal. - The distal rectum and anal verge are normal on retroflexion view.   03/07/2021 Pathology Results   Diagnosis 1. Rectum, biopsy, mass - ADENOCARCINOMA - SEE COMMENT 2. Rectum, biopsy, polyp (1) - TUBULAR ADENOMA (1 OF 1 FRAGMENTS) - NO HIGH-GRADE DYSPLASIA OR MALIGNANCY IDENTIFIED   03/09/2021 Imaging   CT CAP  IMPRESSION: 1. Asymmetric wall thickening in the proximal/mid rectum may correspond to mass seen on endoscopy. 2. Negative for adenopathy or evidence of distal metastatic disease.   03/27/2021 Imaging   MRI Pelvis  IMPRESSION: 1. Low sigmoid primary, greater than 15 cm from the anal verge, as detailed above. Poorly delineated secondary to underdistention in this region. 2. No evidence of pelvic nodal metastasis. 3. Trace perisigmoid fluid, new since the prior CT.   03/28/2021 Initial Diagnosis   Adenocarcinoma of rectum ([place])   04/12/2021 Genetic Testing   Negative genetic testing on the CancerNext-Expanded+RNAinsight panel.  The report date is 04/12/2021.  The CancerNext-Expanded gene panel  offered by [facility] and includes sequencing and rearrangement analysis for the following 77 genes: AIP, ALK, APC*, ATM*, AXIN2, BAP1, BARD1, BLM, BMPR1A, BRCA1*, BRCA2*, BRIP1*, CDC73, CDH1*, CDK4, CDKN1B, CDKN2A, CHEK2*, CTNNA1, DICER1, FANCC, FH, FLCN, GALNT12, KIF1B, LZTR1, MAX, MEN1, MET, MLH1*, MSH2*, MSH3, MSH6*, MUTYH*, NBN, NF1*, NF2, NTHL1, PALB2*, PHOX2B, PMS2*, POT1, PRKAR1A, PTCH1, PTEN*, RAD51C*, RAD51D*, RB1, RECQL, RET, SDHA, SDHAF2, SDHB, SDHC, SDHD, SMAD4, SMARCA4, SMARCB1, SMARCE1, STK11, SUFU, TMEM127, TP53*, TSC1, TSC2, VHL and XRCC2 (sequencing and deletion/duplication); EGFR, EGLN1, HOXB13, KIT, MITF, PDGFRA, POLD1, and POLE (sequencing only); EPCAM and GREM1 (deletion/duplication only). DNA and RNA analyses performed for * genes.      FAMILY HISTORY:  We obtained a detailed, 4-generation family history.  Significant diagnoses are listed below: Family History  Problem Relation Age of Onset   Anxiety disorder Father    Healthy Brother    Bladder Cancer Maternal Grandmother 6   Healthy Half-Sister        paternal half   Colon cancer Neg Hx    Esophageal cancer Neg Hx    Stomach cancer Neg Hx    Pancreatic cancer Neg Hx    Liver disease Neg Hx    Inflammatory bowel disease Neg Hx     The patient has two sons and a daughter who are cancer free.  He has one brother and a paternal half sister who are cancer free.  Both parents are living.   The patient's mother ha two maternal half brothers and four maternal half sisters who are all cancer free.  The maternal grandparents are deceased.  The grandmother had bladder cancer in her 49's.   The patient's father is 21.  He has two  brothers who are cancer free.  His parents are deceased.   Mr. Kirksey is unaware of previous family history of genetic testing for hereditary cancer risks. Patient's maternal ancestors are of [place] descent, and paternal ancestors are of [place] descent. There is no reported  Ashkenazi Jewish ancestry. There is no known consanguinity.   GENETIC TEST RESULTS: Genetic testing reported out on April 12, 2021 through the CancerNext-Expanded+RNAinsight cancer panel found no pathogenic mutations. The CancerNext-Expanded gene panel offered by [facility] and includes sequencing and rearrangement analysis for the following 77 genes: AIP, ALK, APC*, ATM*, AXIN2, BAP1, BARD1, BLM, BMPR1A, BRCA1*, BRCA2*, BRIP1*, CDC73, CDH1*, CDK4, CDKN1B, CDKN2A, CHEK2*, CTNNA1, DICER1, FANCC, FH, FLCN, GALNT12, KIF1B, LZTR1, MAX, MEN1, MET, MLH1*, MSH2*, MSH3, MSH6*, MUTYH*, NBN, NF1*, NF2, NTHL1, PALB2*, PHOX2B, PMS2*, POT1, PRKAR1A, PTCH1, PTEN*, RAD51C*, RAD51D*, RB1, RECQL, RET, SDHA, SDHAF2, SDHB, SDHC, SDHD, SMAD4, SMARCA4, SMARCB1, SMARCE1, STK11, SUFU, TMEM127, TP53*, TSC1, TSC2, VHL and XRCC2 (sequencing and deletion/duplication); EGFR, EGLN1, HOXB13, KIT, MITF, PDGFRA, POLD1, and POLE (sequencing only); EPCAM and GREM1 (deletion/duplication only). DNA and RNA analyses performed for * genes. The test report has been scanned into EPIC and is located under the Molecular Pathology section of the Results Review tab.  A portion of the result report is included below for reference.     We discussed with Mr. Storie that because current genetic testing is not perfect, it is possible there may be a gene mutation in one of these genes that current testing cannot detect, but that chance is small.  We also discussed, that there could be another gene that has not yet been discovered, or that we have not yet tested, that is responsible for the cancer diagnoses in the family. It is also possible there is a hereditary cause for the cancer in the family that Mr. Pesta did not inherit and therefore was not identified in his testing.  Therefore, it is important to remain in touch with cancer genetics in the future so that we can continue to offer Mr. Graca the most up to date genetic testing.    ADDITIONAL GENETIC TESTING: We discussed with Mr. Wimberly that his genetic testing was fairly extensive.  If there are genes identified to increase cancer risk that can be analyzed in the future, we would be happy to discuss and coordinate this testing at that time.    CANCER SCREENING RECOMMENDATIONS: Mr. Welliver test result is considered negative (normal).  This means that we have not identified a hereditary cause for his personal and family history of cancer at this time. Most cancers happen by chance and this negative test suggests that his cancer may fall into this category.    While reassuring, this does not definitively rule out a hereditary predisposition to cancer. It is still possible that there could be genetic mutations that are undetectable by current technology. There could be genetic mutations in genes that have not been tested or identified to increase cancer risk.  Therefore, it is recommended he continue to follow the cancer management and screening guidelines provided by his oncology and primary healthcare provider.   An individual's cancer risk and medical management are not determined by genetic test results alone. Overall cancer risk assessment incorporates additional factors, including personal medical history, family history, and any available genetic information that may result in a personalized plan for cancer prevention and surveillance This negative genetic test simply tells [place] that we cannot yet define why Mr. Schiff has had colorectal cancer  at a young age. Mr. Rokosz's medical management and screening should be based on the prospect that he may be at an increased risk for a second colorectal cancer in the future and should, therefore, undergo more frequent colonoscopy screening at intervals determined by his GI providers.  We also recommended that Mr. Viscomi have an upper endoscopy periodically.  RECOMMENDATIONS FOR FAMILY MEMBERS:  Individuals in this family might be  at some increased risk of developing cancer, over the general population risk, simply due to the family history of cancer.  We recommended women in this family have a yearly mammogram beginning at age 53, or 76 years younger than the earliest onset of cancer, an annual clinical breast exam, and perform monthly breast self-exams. Women in this family should also have a gynecological exam as recommended by their primary provider. All family members should be referred for colonoscopy starting at age 64.  FOLLOW-UP: Lastly, we discussed with Mr. Rosenberg that cancer genetics is a rapidly advancing field and it is possible that new genetic tests will be appropriate for him and/or his family members in the future. We encouraged him to remain in contact with cancer genetics on an annual basis so we can update his personal and family histories and let him know of advances in cancer genetics that may benefit this family.   Our contact number was provided. Mr. Boylen questions were answered to his satisfaction, and he knows he is welcome to call us at anytime with additional questions or concerns.   Roma Kayser, [place], [facility] Licensed, Certified Genetic Counselor [email or web address]

## 2021-04-26 NOTE — Progress Notes (Signed)
Sent message, via epic in basket, requesting orders in epic from surgeon.  

## 2021-04-27 ENCOUNTER — Other Ambulatory Visit (HOSPITAL_COMMUNITY): Payer: Self-pay | Admitting: *Deleted

## 2021-04-27 ENCOUNTER — Ambulatory Visit (HOSPITAL_COMMUNITY): Payer: BC Managed Care – PPO | Admitting: Certified Registered Nurse Anesthetist

## 2021-04-27 ENCOUNTER — Other Ambulatory Visit: Payer: Self-pay

## 2021-04-27 ENCOUNTER — Encounter (HOSPITAL_COMMUNITY): Payer: Self-pay | Admitting: Gastroenterology

## 2021-04-27 ENCOUNTER — Ambulatory Visit (HOSPITAL_COMMUNITY)
Admission: RE | Admit: 2021-04-27 | Discharge: 2021-04-27 | Disposition: A | Discharge status: Home / Self Care | Payer: BC Managed Care – PPO | Attending: Gastroenterology | Admitting: Gastroenterology

## 2021-04-27 ENCOUNTER — Encounter (HOSPITAL_COMMUNITY)
Admission: RE | Disposition: A | Discharge status: Home / Self Care | Payer: Self-pay | Source: Home / Self Care | Attending: Gastroenterology

## 2021-04-27 DIAGNOSIS — D649 Anemia, unspecified: Secondary | ICD-10-CM | POA: Insufficient documentation

## 2021-04-27 DIAGNOSIS — K5900 Constipation, unspecified: Secondary | ICD-10-CM | POA: Diagnosis not present

## 2021-04-27 DIAGNOSIS — C218 Malignant neoplasm of overlapping sites of rectum, anus and anal canal: Secondary | ICD-10-CM

## 2021-04-27 DIAGNOSIS — C187 Malignant neoplasm of sigmoid colon: Secondary | ICD-10-CM | POA: Diagnosis present

## 2021-04-27 DIAGNOSIS — K6289 Other specified diseases of anus and rectum: Secondary | ICD-10-CM

## 2021-04-27 DIAGNOSIS — F419 Anxiety disorder, unspecified: Secondary | ICD-10-CM | POA: Insufficient documentation

## 2021-04-27 DIAGNOSIS — Z8719 Personal history of other diseases of the digestive system: Secondary | ICD-10-CM | POA: Insufficient documentation

## 2021-04-27 DIAGNOSIS — C20 Malignant neoplasm of rectum: Secondary | ICD-10-CM | POA: Insufficient documentation

## 2021-04-27 HISTORY — PX: EUS: SHX5427

## 2021-04-27 HISTORY — PX: FLEXIBLE SIGMOIDOSCOPY: SHX5431

## 2021-04-27 SURGERY — SIGMOIDOSCOPY, FLEXIBLE
Anesthesia: Monitor Anesthesia Care

## 2021-04-27 MED ORDER — SODIUM CHLORIDE 0.9 % IV SOLN: INTRAVENOUS | Status: DC

## 2021-04-27 MED ORDER — LACTATED RINGERS IV SOLN
INTRAVENOUS | Status: AC | PRN
  Administered 2021-04-27: 1000 mL via INTRAVENOUS

## 2021-04-27 MED ORDER — PROPOFOL 10 MG/ML IV BOLUS
INTRAVENOUS | Status: DC | PRN
  Administered 2021-04-27: 20 mg via INTRAVENOUS
  Administered 2021-04-27: 10 mg via INTRAVENOUS
  Administered 2021-04-27: 40 mg via INTRAVENOUS

## 2021-04-27 MED ORDER — PROPOFOL 500 MG/50ML IV EMUL
INTRAVENOUS | Status: DC | PRN
  Administered 2021-04-27: 125 ug/kg/min via INTRAVENOUS

## 2021-04-27 MED ORDER — LIDOCAINE 2% (20 MG/ML) 5 ML SYRINGE
INTRAMUSCULAR | Status: DC | PRN
  Administered 2021-04-27: 80 mg via INTRAVENOUS

## 2021-04-27 NOTE — Op Note (Signed)
[facility] Patient Name: Jerry Allison Procedure Date: 04/27/2021 MRN: [id number] Attending MD: [name] , MD Date of Birth: May 01, 1976 CSN: [id number] Age: 45 Admit Type: Outpatient Procedure:                Lower EUS Indications:              Recently diagnosed recto/sigmoid adenocarcinoma,                            staging Providers:                [name], MD, Kary Kos, RN, [facility]                            Technician, Technician Referring MD:             Annye English, MD; [name], MD; Dustin Flock, MD Medicines:                Monitored Anesthesia Care Complications:            No immediate complications. Estimated blood loss:                            None. Estimated Blood Loss:     Estimated blood loss: none. Procedure:                Pre-Anesthesia Assessment:                           - Prior to the procedure, a History and Physical                            was performed, and patient medications and                            allergies were reviewed. The patient's tolerance of                            previous anesthesia was also reviewed. The risks                            and benefits of the procedure and the sedation                            options and risks were discussed with the patient.                            All questions were answered, and informed consent                            was obtained. Prior Anticoagulants: The patient has                            taken no previous anticoagulant or antiplatelet  agents. ASA Grade Assessment: II - A patient with                            mild systemic disease. After reviewing the risks                            and benefits, the patient was deemed in                            satisfactory condition to undergo the procedure.                           After obtaining informed consent, the endoscope was                             passed under direct vision. Throughout the                            procedure, the patient's blood pressure, pulse, and                            oxygen saturations were monitored continuously. The                            [id number] ([id number]) Olympus radial ultrasound                            scope was introduced through the anus and advanced                            to the the sigmoid colon. The lower EUS was                            accomplished without difficulty. The patient                            tolerated the procedure well. The quality of the                            bowel preparation was adequate.                           I was able to palpate the distal edge of the tumor                            on DRE, I suspect this was partly possible due to                            'kaleidoscoping' of the rectum lumen. Scope In: 12:55:52 PM Scope Out: 1:19:05 PM Total Procedure Duration: 0 hours 23 minutes 13 seconds  Findings:      Sigmoidoscopic findings:      1. Four cm long, ulcerated, circumferential tumor causing an incomplete       malignant stricture (  lumen 5-73mm). The distal edge of the tumor is 10cm       from the anal verge. There is a 1.5cm nodule about 5-71mm distal to the       tumor that is probably a satellite lesion. There is a previously placed       Spot tattoo just proximal to the tumor.      EUS findings:      1. I could not completely evaluate the tumor due to the stricture but it       was clearly at least uT3 from evaluation of the distal aspect (invasion       into and through the muscularis propria layer of the rectal wall).      2. Given incomplete evaluation nodal staging was not possible (uNx). Impression:               - 4cm long, circumferential, at least uT3Nx rectal                            adenocarcinoma with distal edge located 10cm from                            the anal verge. 1.5cm satellite nodule located                             5-26mm distal to the tumor (around 9cm from the anal                            verge). Moderate Sedation:      Not Applicable - Patient had care per Anesthesia. Recommendation:           - Discharge patient to home (ambulatory).                           - He may benefit from neoadjuvant chemo/XRT. Procedure Code(s):        --- Professional ---                           [phone], Sigmoidoscopy, flexible; with endoscopic                            ultrasound examination Diagnosis Code(s):        --- Professional ---                           C21.8, Malignant neoplasm of overlapping sites of                            rectum, anus and anal canal CPT copyright 2019 American Medical Association. All rights reserved. The codes documented in this report are preliminary and upon coder review may  be revised to meet current compliance requirements. [name], MD 04/27/2021 1:31:49 PM This report has been signed electronically. Number of Addenda: 0

## 2021-04-27 NOTE — Interval H&P Note (Signed)
History and Physical Interval Note:  04/27/2021 11:15 AM  Jerry Allison  has presented today for surgery, with the diagnosis of rectal cancer.  The various methods of treatment have been discussed with the patient and family. After consideration of risks, benefits and other options for treatment, the patient has consented to  Procedure(s): LOWER ENDOSCOPIC ULTRASOUND (EUS) (N/A) as a surgical intervention.  The patient's history has been reviewed, patient examined, no change in status, stable for surgery.  I have reviewed the patient's chart and labs.  Questions were answered to the patient's satisfaction.     [name]

## 2021-04-27 NOTE — Anesthesia Preprocedure Evaluation (Signed)
Anesthesia Evaluation  Patient identified by MRN, date of birth, ID band Patient awake    Reviewed: Allergy & Precautions, NPO status , Patient's Chart, lab work & pertinent test results  History of Anesthesia Complications Negative for: history of anesthetic complications  Airway Mallampati: I  TM Distance: >3 FB Neck ROM: Full    Dental  (+) Dental Advisory Given, Teeth Intact   Pulmonary neg pulmonary ROS,    breath sounds clear to auscultation       Cardiovascular negative cardio ROS   Rhythm:Regular     Neuro/Psych PSYCHIATRIC DISORDERS Anxiety negative neurological ROS     GI/Hepatic Neg liver ROS, rectal cancer   Endo/Other  negative endocrine ROS  Renal/GU negative Renal ROS     Musculoskeletal negative musculoskeletal ROS (+)   Abdominal   Peds  Hematology negative hematology ROS (+)   Anesthesia Other Findings   Reproductive/Obstetrics                             Anesthesia Physical Anesthesia Plan  ASA: 1  Anesthesia Plan: MAC   Post-op Pain Management: Minimal or no pain anticipated   Induction: Intravenous  PONV Risk Score and Plan: 1 and Propofol infusion and Treatment may vary due to age or medical condition  Airway Management Planned: Nasal Cannula  Additional Equipment: None  Intra-op Plan:   Post-operative Plan:   Informed Consent: I have reviewed the patients History and Physical, chart, labs and discussed the procedure including the risks, benefits and alternatives for the proposed anesthesia with the patient or authorized representative who has indicated his/her understanding and acceptance.     Dental advisory given  Plan Discussed with: CRNA and Anesthesiologist  Anesthesia Plan Comments:         Anesthesia Quick Evaluation

## 2021-04-27 NOTE — Patient Instructions (Signed)
DUE TO COVID-19 ONLY ONE VISITOR IS ALLOWED TO COME WITH YOU AND STAY IN THE WAITING ROOM ONLY DURING PRE OP AND PROCEDURE.   **NO VISITORS ARE ALLOWED IN THE SHORT STAY AREA OR RECOVERY ROOM!!**  IF YOU WILL BE ADMITTED INTO THE HOSPITAL YOU ARE ALLOWED ONLY TWO SUPPORT PEOPLE DURING VISITATION HOURS ONLY (7 AM -8PM)   The support person(s) must pass our screening, gel in and out, and wear a mask at all times, including in the patient's room. Patients must also wear a mask when staff or their support person are in the room. Visitors GUEST BADGE MUST BE WORN VISIBLY  One adult visitor may remain with you overnight and MUST be in the room by 8 P.M.  No visitors under the age of 7. Any visitor under the age of 15 must be accompanied by an adult.    COVID SWAB TESTING MUST BE COMPLETED ON:  05/01/21                8A - 3P **MUST PRESENT COMPLETED FORM AT TESTING SITE**    [place] and [place] (backside of the building) You are not required to quarantine, however you are required to wear a well-fitted mask when you are out and around people not in your household.  Hand Hygiene often Do NOT share personal items Notify your provider if you are in close contact with someone who has COVID or you develop fever 100.4 or greater, new onset of sneezing, cough, sore throat, shortness of breath or body aches.  [address], [address], must go inside of the hospital, NOT A DRIVE THRU!  (Must self quarantine after testing. Follow instructions on handout.)       Your procedure is scheduled on: 05/03/21   Report to [facility] Main Entrance    Report to admitting at : 6:15 AM   Call this number if you have problems the morning of surgery [phone]   Do not eat food :After Midnight.   May have liquids until    day of surgery  CLEAR LIQUID DIET  Foods Allowed                                                                      Foods Excluded  Water, Black Coffee and tea, regular and decaf                             liquids that you cannot  Plain Jell-O in any flavor  (No red)                                           see through such as: Fruit ices (not with fruit pulp)                                     milk, soups, orange juice              Iced  Popsicles (No red)                                    All solid food                                   Apple juices Sports drinks like Gatorade (No red) Lightly seasoned clear broth or consume(fat free) Sugar  Sample Menu Breakfast                                Lunch                                     Supper Cranberry juice                    Beef broth                            Chicken broth Jell-O                                     Grape juice                           Apple juice Coffee or tea                        Jell-O                                      Popsicle                                                Coffee or tea                        Coffee or tea     Complete one Ensure drink the morning of surgery at       the day of surgery.   Drink 2 Ensure drinks the night before surgery.  Complete one Ensure drink the morning of surgery 3 hours prior to scheduled surgery.     The day of surgery:  Drink ONE (1) Pre-Surgery Clear Ensure or G2 by am the morning of surgery. Drink in one sitting. Do not sip.  This drink was given to you during your hospital  pre-op appointment visit. Nothing else to drink after completing the  Pre-Surgery Clear Ensure or G2.          If you have questions, please contact your surgeon's office.     Oral Hygiene is also important to reduce your risk of infection.                                    Remember - BRUSH  YOUR TEETH THE MORNING OF SURGERY WITH YOUR REGULAR TOOTHPASTE   Do NOT smoke after Midnight   Take these medicines the morning of surgery with A SIP OF WATER: lexapro  DO NOT TAKE ANY ORAL  DIABETIC MEDICATIONS DAY OF YOUR SURGERY                              You may not have any metal on your body including hair pins, jewelry, and body piercing             Do not wear make-up, lotions, powders, perfumes/cologne, or deodorant  Do not wear nail polish including gel and S&S, artificial/acrylic nails, or any other type of covering on natural nails including finger and toenails. If you have artificial nails, gel coating, etc. that needs to be removed by a nail salon please have this removed prior to surgery or surgery may need to be canceled/ delayed if the surgeon/ anesthesia feels like they are unable to be safely monitored.   Do not shave  48 hours prior to surgery.               Men may shave face and neck.   Do not bring valuables to the hospital. [place].   Contacts, dentures or bridgework may not be worn into surgery.   Bring small overnight bag day of surgery.    Patients discharged on the day of surgery will not be allowed to drive home.   Special Instructions: Bring a copy of your healthcare power of attorney and living will documents         the day of surgery if you haven't scanned them before.              Please read over the following fact sheets you were given: IF YOU HAVE QUESTIONS ABOUT YOUR PRE-OP INSTRUCTIONS PLEASE CALL [phone]     [facility] - Preparing for Surgery Before surgery, you can play an important role.  Because skin is not sterile, your skin needs to be as free of germs as possible.  You can reduce the number of germs on your skin by washing with CHG (chlorahexidine gluconate) soap before surgery.  CHG is an antiseptic cleaner which kills germs and bonds with the skin to continue killing germs even after washing. Please DO NOT use if you have an allergy to CHG or antibacterial soaps.  If your skin becomes reddened/irritated stop using the CHG and inform your nurse when you arrive at Short  Stay. Do not shave (including legs and underarms) for at least 48 hours prior to the first CHG shower.  You may shave your face/neck. Please follow these instructions carefully:  1.  Shower with CHG Soap the night before surgery and the  morning of Surgery.  2.  If you choose to wash your hair, wash your hair first as usual with your  normal  shampoo.  3.  After you shampoo, rinse your hair and body thoroughly to remove the  shampoo.                           4.  Use CHG as you would any other liquid soap.  You can apply chg directly  to the skin and wash  Gently with a scrungie or clean washcloth.  5.  Apply the CHG Soap to your body ONLY FROM THE NECK DOWN.   Do not use on face/ open                           Wound or open sores. Avoid contact with eyes, ears mouth and genitals (private parts).                       Wash face,  Genitals (private parts) with your normal soap.             6.  Wash thoroughly, paying special attention to the area where your surgery  will be performed.  7.  Thoroughly rinse your body with warm water from the neck down.  8.  DO NOT shower/wash with your normal soap after using and rinsing off  the CHG Soap.                9.  Pat yourself dry with a clean towel.            10.  Wear clean pajamas.            11.  Place clean sheets on your bed the night of your first shower and do not  sleep with pets. Day of Surgery : Do not apply any lotions/deodorants the morning of surgery.  Please wear clean clothes to the hospital/surgery center.  FAILURE TO FOLLOW THESE INSTRUCTIONS MAY RESULT IN THE CANCELLATION OF YOUR SURGERY PATIENT SIGNATURE_________________________________  NURSE SIGNATURE__________________________________  ________________________________________________________________________

## 2021-04-27 NOTE — Discharge Instructions (Signed)

## 2021-04-27 NOTE — Transfer of Care (Signed)
Immediate Anesthesia Transfer of Care Note  Patient: Jerry Allison  Procedure(s) Performed: LOWER ENDOSCOPIC ULTRASOUND (EUS) FLEXIBLE SIGMOIDOSCOPY  Patient Location: Endoscopy Unit  Anesthesia Type:MAC  Level of Consciousness: drowsy  Airway & Oxygen Therapy: Patient Spontanous Breathing and Patient connected to face mask oxygen  Post-op Assessment: Report given to RN and Post -op Vital signs reviewed and stable  Post vital signs: Reviewed and stable  Last Vitals:  Vitals Value Taken Time  BP    Temp    Pulse 60 04/27/21 1324  Resp 11 04/27/21 1324  SpO2 100 % 04/27/21 1324  Vitals shown include unvalidated device data.  Last Pain:  Vitals:   04/27/21 1151  TempSrc: Oral  PainSc: 0-No pain         Complications: No notable events documented.

## 2021-04-28 ENCOUNTER — Telehealth: Payer: Self-pay | Admitting: Pharmacy Technician

## 2021-04-28 ENCOUNTER — Telehealth: Payer: Self-pay

## 2021-04-28 ENCOUNTER — Inpatient Hospital Stay: Payer: BC Managed Care – PPO | Attending: Hematology | Admitting: Hematology

## 2021-04-28 ENCOUNTER — Other Ambulatory Visit (HOSPITAL_COMMUNITY): Payer: Self-pay

## 2021-04-28 ENCOUNTER — Encounter (HOSPITAL_COMMUNITY): Payer: BC Managed Care – PPO

## 2021-04-28 DIAGNOSIS — C20 Malignant neoplasm of rectum: Secondary | ICD-10-CM | POA: Diagnosis not present

## 2021-04-28 DIAGNOSIS — D509 Iron deficiency anemia, unspecified: Secondary | ICD-10-CM | POA: Insufficient documentation

## 2021-04-28 DIAGNOSIS — R1110 Vomiting, unspecified: Secondary | ICD-10-CM | POA: Insufficient documentation

## 2021-04-28 DIAGNOSIS — Z5111 Encounter for antineoplastic chemotherapy: Secondary | ICD-10-CM | POA: Insufficient documentation

## 2021-04-28 MED ORDER — CAPECITABINE 500 MG PO TABS
825.0000 mg/m2 | ORAL_TABLET | Freq: Two times a day (BID) | ORAL | 1 refills | Status: DC
Start: 2021-04-28 — End: 2021-05-01

## 2021-04-28 MED ORDER — CAPECITABINE 500 MG PO TABS
825.0000 mg/m2 | ORAL_TABLET | Freq: Two times a day (BID) | ORAL | 1 refills | Status: DC
  Filled 2021-04-28: qty 90, 15d supply, fill #0

## 2021-04-28 NOTE — Telephone Encounter (Signed)
Oral Oncology Patient Advocate Encounter:  Received New start notification for  Xeloda . Will update as we work through the benefits process.   Received notification from [place] regarding a prior authorization for generic Xeloda . Authorization has been APPROVED from 03/29/21 to 04/28/22.   Unable to process test claim due to pharmacy lock out. Patient must fill through [facility]: [phone]  Authorization # Key: Remi Deter - PA Case ID: [id number]

## 2021-04-28 NOTE — Progress Notes (Signed)
[facility]   [phone] [phone]   Clinic Follow up Note   Patient Care Team: Derinda Late, MD as PCP - General (Family Medicine) [name], MD as Consulting Physician (Gastroenterology) [name], MD as Consulting Physician (Hematology)  Date of Service:  04/28/2021  I connected with Jerry Allison on 04/28/2021 at 10am and again  4:20 PM EST by telephone visit and verified that I am speaking with the correct person using two identifiers.  I discussed the limitations, risks, security and privacy concerns of performing an evaluation and management service by telephone and the availability of in person appointments. I also discussed with the patient that there may be a patient responsible charge related to this service. The patient expressed understanding and agreed to proceed.   Other persons participating in the visit and their role in the encounter:  none  Patient's location:  home Provider's location:  my office  CHIEF COMPLAINT: f/u of rectal cancer  CURRENT THERAPY:  To start concurrent chemoRT with Xeloda  ASSESSMENT & PLAN:  Jerry Allison is a 45 y.o. male with   1. 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 He was referred to Dr. [name] and proceeded to colonoscopy on 03/07/21 showing a partially-obstructing tumor in proximal rectum. Biopsy confirmed adenocarcinoma and a tubular adenoma polyp. -baseline CEA that day was normal at 1.7. -staging CT CAP 03/09/21 was negative for adenopathy or metastatic disease. -pelvic MRI 03/27/21 was also negative for nodal metastasis abut was not able to give T stage  -he underwent EUS on 04/27/21 with Dr. Ardis Hughs showing: 4 cm rectal adenocarcinoma invading into and through the muscularis propria layer of rectal wall; 1.5 cm satellite nodule located 5-8 mm distal to tumor. This is staged as T3, node evaluation was not possible due  to partial obstruction  -I discussed the standard treatment plan for T3N0 rectal cancer-- either total neoadjuvant therapy (TNT) with chemo FOLFOX or [place] ox for 4 and half months, followed by chemoradiation and surgery, or neoadjuvant Xeloda with concurrent radiation, followed by surgery and adjuvant chemotherapy.  I reviewed the pros treatment logistics, and the potential side effect from chemo with him in great detail.  He would like to proceed with neoadjuvant chemoradiation, not TNT --Chemotherapy consent: Side effects including but does not not limited to, fatigue, nausea, vomiting, diarrhea, hair loss, neuropathy, fluid retention, renal and kidney dysfunction, neutropenic fever, needed for blood transfusion, bleeding, were discussed with patient in great detail. He agrees to proceed. -I informed Dr. Lisbeth Renshaw for simulation next week, plan to start chemoRT on 12/12    2. Symptom Management: epigastric pain, ?constipation, hematochezia -he reports constant epigastric pain since MRI on 10/31 -I recommend he start miralax to keep his bowels regular, especially on oral iron.   3. Mild Anemia -work up on 03/29/21 showed: iron 23, ferritin 4, hgb 11.1.     PLAN:  -I prescribed Xeloda today -CT simulation next week, to start chemoradiation on December 12 -Lab weekly and follow-up every other week when he is on chemo and radiation  Addendum -I spoke with Dr. Dema Severin, he informed me that the patient would not need ileostomy after chemoradiation, and reverse it in 3 months.  -I informed pt about the need ileostomy, and adjuvant radiation will postpone ileostomy taken down by 2-3 months. He was very unhappy about that, and requests a second opinion at [facility].  I will refer him.   No problem-specific Assessment &  Plan notes found for this encounter.   SUMMARY OF ONCOLOGIC HISTORY: Oncology History  Adenocarcinoma of rectum ([place])  03/07/2021 Procedure   Colonoscopy, Dr.  [name]  Impression - Malignant appearing partially obstructing tumor in the proximal rectum. Biopsied. Tattooed. - One 3 mm polyp in the distal rectum, removed with a cold snare. Resected and retrieved. - The examined portion of the ileum was normal. - The distal rectum and anal verge are normal on retroflexion view.   03/07/2021 Pathology Results   Diagnosis 1. Rectum, biopsy, mass - ADENOCARCINOMA - SEE COMMENT 2. Rectum, biopsy, polyp (1) - TUBULAR ADENOMA (1 OF 1 FRAGMENTS) - NO HIGH-GRADE DYSPLASIA OR MALIGNANCY IDENTIFIED   03/09/2021 Imaging   CT CAP  IMPRESSION: 1. Asymmetric wall thickening in the proximal/mid rectum may correspond to mass seen on endoscopy. 2. Negative for adenopathy or evidence of distal metastatic disease.   03/27/2021 Imaging   MRI Pelvis  IMPRESSION: 1. Low sigmoid primary, greater than 15 cm from the anal verge, as detailed above. Poorly delineated secondary to underdistention in this region. 2. No evidence of pelvic nodal metastasis. 3. Trace perisigmoid fluid, new since the prior CT.   03/28/2021 Initial Diagnosis   Adenocarcinoma of rectum ([place])   04/12/2021 Genetic Testing   Negative genetic testing on the CancerNext-Expanded+RNAinsight panel.  The report date is 04/12/2021.  The CancerNext-Expanded gene panel offered by [facility] and includes sequencing and rearrangement analysis for the following 77 genes: AIP, ALK, APC*, ATM*, AXIN2, BAP1, BARD1, BLM, BMPR1A, BRCA1*, BRCA2*, BRIP1*, CDC73, CDH1*, CDK4, CDKN1B, CDKN2A, CHEK2*, CTNNA1, DICER1, FANCC, FH, FLCN, GALNT12, KIF1B, LZTR1, MAX, MEN1, MET, MLH1*, MSH2*, MSH3, MSH6*, MUTYH*, NBN, NF1*, NF2, NTHL1, PALB2*, PHOX2B, PMS2*, POT1, PRKAR1A, PTCH1, PTEN*, RAD51C*, RAD51D*, RB1, RECQL, RET, SDHA, SDHAF2, SDHB, SDHC, SDHD, SMAD4, SMARCA4, SMARCB1, SMARCE1, STK11, SUFU, TMEM127, TP53*, TSC1, TSC2, VHL and XRCC2 (sequencing and deletion/duplication); EGFR, EGLN1, HOXB13, KIT, MITF,  PDGFRA, POLD1, and POLE (sequencing only); EPCAM and GREM1 (deletion/duplication only). DNA and RNA analyses performed for * genes.       INTERVAL HISTORY:  Jerry Allison was contacted for a follow up of rectal cancer. He was last seen by me on 03/29/21. He is clinically doing well, no new complains.    All other systems were reviewed with the patient and are negative.  MEDICAL HISTORY:  Past Medical History:  Diagnosis Date   Anxiety    Family history of bladder cancer    Rectal cancer ([place]) 02/2021    SURGICAL HISTORY: Past Surgical History:  Procedure Laterality Date   NO PAST SURGERIES      I have reviewed the social history and family history with the patient and they are unchanged from previous note.  ALLERGIES:  is allergic to shellfish allergy.  MEDICATIONS:  Current Outpatient Medications  Medication Sig Dispense Refill   dicyclomine (BENTYL) 20 MG tablet Take 1 tablet (20 mg total) by mouth 3 (three) times daily as needed for spasms. 60 tablet 1   escitalopram (LEXAPRO) 10 MG tablet Take 5 mg by mouth daily.     No current facility-administered medications for this visit.    PHYSICAL EXAMINATION: ECOG PERFORMANCE STATUS: 0 - Asymptomatic  There were no vitals filed for this visit. Wt Readings from Last 3 Encounters:  04/27/21 185 lb (83.9 kg)  03/29/21 187 lb 4.8 oz (85 kg)  03/28/21 188 lb 4 oz (85.4 kg)     No vitals taken today, Exam not performed today  LABORATORY DATA:  I  have reviewed the data as listed CBC Latest Ref Rng & Units 03/29/2021 02/20/2021 01/09/2021  WBC 4.0 - 10.5 K/uL 6.8 6.2 6.3  Hemoglobin 13.0 - 17.0 g/dL 11.1(L) 11.7(L) 13.4  Hematocrit 39.0 - 52.0 % 34.4(L) 35.9(L) 41.8  Platelets 150 - 400 K/uL 368 367.0 325     CMP Latest Ref Rng & Units 03/29/2021 01/09/2021  Glucose 70 - 99 mg/dL 97 91  BUN 6 - 20 mg/dL 18 16  Creatinine 0.61 - 1.24 mg/dL 1.19 1.22  Sodium 135 - 145 mmol/L 139 139  Potassium 3.5 - 5.1 mmol/L 4.5 4.0   Chloride 98 - 111 mmol/L 106 108  CO2 22 - 32 mmol/L 27 25  Calcium 8.9 - 10.3 mg/dL 8.9 9.0  Total Protein 6.5 - 8.1 g/dL 7.3 6.7  Total Bilirubin 0.3 - 1.2 mg/dL <0.2(L) 0.4  Alkaline Phos 38 - 126 U/L 56 42  AST 15 - 41 U/L 23 25  ALT 0 - 44 U/L 33 29      RADIOGRAPHIC STUDIES: I have personally reviewed the radiological images as listed and agreed with the findings in the report. No results found.    No orders of the defined types were placed in this encounter.  All questions were answered. The patient knows to call the clinic with any problems, questions or concerns. No barriers to learning was detected. The total time spent in the appointment was 30 minutes.     [name], MD 04/28/2021   I, Wilburn Mylar, am acting as scribe for [name], MD.   I have reviewed the above documentation for accuracy and completeness, and I agree with the above.

## 2021-04-28 NOTE — Telephone Encounter (Signed)
Oral Oncology Pharmacist Encounter  Received new prescription for Xeloda (Capecitabine) for the neoadjuvant treatment of rectal adenocarcinoma in conjunction with radiation therapy.  Labs from 03/29/21 assessed, no relevant lab abnormalities. Prescription dose and frequency assessed.   Current medication list in Epic reviewed, no relevant lab abnormalities DDIs with capecitabine identified.  Evaluated chart and no patient barriers to medication adherence identified.   Prescription has been e-scribed to the [facility] for benefits analysis and approval.  Oral Oncology Clinic will continue to follow for insurance authorization, copayment issues, initial counseling and start date.  Benn Moulder, PharmD Pharmacy Resident  04/28/2021 12:49 PM

## 2021-04-28 NOTE — Telephone Encounter (Signed)
Spoke with pt via telephone to remind him of his telephone visit with Dr. Burr Medico today.  Dr. Burr Medico also want the pt to be aware that his appt with Dr. Dema Severin is canceled for today 04/28/2021 d/t the pt will do radiation which Dr Burr Medico would like for the pt to start radiation on 05/08/2021.  Pt canceled his preadmission appt today for surgery.  Pt will see Dr. Dema Severin later in December at which time they will discuss the possibility of surgery.  Pt verbalized understanding and had no further questions or concerns.

## 2021-04-29 NOTE — Anesthesia Postprocedure Evaluation (Signed)
Anesthesia Post Note  Patient: Jerry Allison  Procedure(s) Performed: LOWER ENDOSCOPIC ULTRASOUND (EUS) FLEXIBLE SIGMOIDOSCOPY     Patient location during evaluation: Endoscopy Anesthesia Type: MAC Level of consciousness: awake and alert Pain management: pain level controlled Vital Signs Assessment: post-procedure vital signs reviewed and stable Respiratory status: spontaneous breathing, nonlabored ventilation, respiratory function stable and patient connected to nasal cannula oxygen Cardiovascular status: stable and blood pressure returned to baseline Postop Assessment: no apparent nausea or vomiting Anesthetic complications: no   No notable events documented.  Last Vitals:  Vitals:   04/27/21 1340 04/27/21 1350  BP: 102/69 (!) 118/91  Pulse: (!) 48 (!) 59  Resp: 10 (!) 22  Temp: (!) 36.4 C   SpO2: 100% 99%    Last Pain:  Vitals:   04/27/21 1350  TempSrc:   PainSc: 0-No pain                 Mila Pair

## 2021-04-30 ENCOUNTER — Encounter (HOSPITAL_COMMUNITY): Payer: Self-pay | Admitting: Gastroenterology

## 2021-05-01 ENCOUNTER — Telehealth: Payer: Self-pay | Admitting: Hematology

## 2021-05-01 ENCOUNTER — Other Ambulatory Visit: Payer: Self-pay | Admitting: Hematology

## 2021-05-01 ENCOUNTER — Other Ambulatory Visit: Payer: Self-pay

## 2021-05-01 DIAGNOSIS — C20 Malignant neoplasm of rectum: Secondary | ICD-10-CM

## 2021-05-01 MED ORDER — ONDANSETRON HCL 8 MG PO TABS: 8.0000 mg | ORAL_TABLET | Freq: Two times a day (BID) | ORAL | 1 refills | Status: DC | PRN

## 2021-05-01 MED ORDER — CAPECITABINE 500 MG PO TABS: ORAL_TABLET | ORAL | 1 refills | Status: DC

## 2021-05-01 MED ORDER — PROCHLORPERAZINE MALEATE 10 MG PO TABS: 10.0000 mg | ORAL_TABLET | Freq: Four times a day (QID) | ORAL | 1 refills | Status: DC | PRN

## 2021-05-01 NOTE — Addendum Note (Signed)
Addended by: [name] on: 05/01/2021 02:50 PM   Modules accepted: Orders

## 2021-05-01 NOTE — Telephone Encounter (Signed)
Scheduled follow-up appointments per 12/2 los. Patient is aware. 

## 2021-05-01 NOTE — Progress Notes (Signed)
Jerry Allison left a vm stating he is no longer interested in a second opinion at [facility].  I relayed this information to Dr Burr Medico.

## 2021-05-01 NOTE — Progress Notes (Signed)
START ON PATHWAY REGIMEN - Colorectal     A cycle is every 21 days:     Capecitabine      Oxaliplatin   **Always confirm dose/schedule in your pharmacy ordering system**  Patient Characteristics: Preoperative or Nonsurgical Candidate (Clinical Staging), Rectal, cT3 - cT4, cN0 or Any cT, cN+ Tumor Location: Rectal Therapeutic Status: Preoperative or Nonsurgical Candidate (Clinical Staging) AJCC T Category: cT3 AJCC N Category: cN0 AJCC M Category: cM0 AJCC 8 Stage Grouping: IIA Intent of Therapy: Curative Intent, Discussed with Patient

## 2021-05-01 NOTE — Telephone Encounter (Signed)
Scheduled per sch msg. Called and spoke with patient. Confirmed appts  

## 2021-05-01 NOTE — Telephone Encounter (Signed)
Oral Oncology Pharmacist Encounter  Confirmed with Dr. Burr Medico that patient will be starting CAPOX on 05/10/21. New prescription for Xeloda (capecitabine) with updated appropriate dose and frequency sent to [facility] by MD.  Hulen Skains and spoke to pharmacist at [facility] who canceled out previous sent Xeloda prescription from 04/28/21 that had radiation dosing. Prescription has been updated in their system to reflect updated dose and frequency. They will reach out to patient to schedule shipment of medication.   Oral Oncology Clinic will continue to follow.   Leron Croak, PharmD, BCPS Hematology/Oncology Clinical Pharmacist [facility] [phone] 05/01/2021 3:24 PM

## 2021-05-02 ENCOUNTER — Encounter (HOSPITAL_COMMUNITY): Payer: Self-pay | Admitting: Certified Registered Nurse Anesthetist

## 2021-05-02 ENCOUNTER — Ambulatory Visit: Payer: BC Managed Care – PPO | Admitting: Radiation Oncology

## 2021-05-02 ENCOUNTER — Ambulatory Visit: Payer: BC Managed Care – PPO

## 2021-05-02 NOTE — Telephone Encounter (Signed)
Oral Chemotherapy Pharmacist Encounter  I spoke with patient for overview of: Xeloda (capecitabine) for the neoadjuvant treatment of rectal cancer in conjunction with infusional oxaliplatin, planned duration of ~6 cycles.  Counseled patient on administration, dosing, side effects, monitoring, drug-food interactions, safe handling, storage, and disposal.  Patient will take Xeloda 500mg  tablets, 3 tablets (1500mg ) by mouth in AM and 4 tabs (2000mg ) by mouth in PM, within 30 minutes of finishing meals, on days 1-14 of each 21 day cycle.   Oxaliplatin will be infused on day 1 of each 21 day cycle.  Xeloda and oxaliplatin start date: 05/10/21  Adverse effects include but are not limited to: fatigue, decreased blood counts, GI upset, diarrhea, mouth sores, and hand-foot syndrome.  Patient has anti-emetic on hand and knows to take it if nausea develops.   Patient will obtain anti diarrheal and alert the office of 4 or more loose stools above baseline.  Reviewed with patient importance of keeping a medication schedule and plan for any missed doses. No barriers to medication adherence identified.  Medication reconciliation performed and medication/allergy list updated.  Insurance authorization for Xeloda has been obtained. Patient's insurance requires this be filled through [employer]. The new prescription should be delivered to patient's home within the next 1-2 business days.   All questions answered.  Mr. Feutz voiced understanding and appreciation.   Medication education handout placed in mail for patient. Patient knows to call the office with questions or concerns. Oral Chemotherapy Clinic phone number provided to patient.   Leron Croak, PharmD, BCPS Hematology/Oncology Clinical Pharmacist [facility] [place] [phone] 05/02/2021 10:32 AM

## 2021-05-03 ENCOUNTER — Encounter (HOSPITAL_COMMUNITY): Admission: RE | Payer: Self-pay | Source: Home / Self Care

## 2021-05-03 ENCOUNTER — Inpatient Hospital Stay (HOSPITAL_COMMUNITY): Admission: RE | Admit: 2021-05-03 | Payer: BC Managed Care – PPO | Source: Home / Self Care | Admitting: Surgery

## 2021-05-03 ENCOUNTER — Ambulatory Visit: Payer: BC Managed Care – PPO | Admitting: Radiation Oncology

## 2021-05-03 DIAGNOSIS — Z01818 Encounter for other preprocedural examination: Secondary | ICD-10-CM

## 2021-05-03 SURGERY — RESECTION, RECTUM, LOW ANTERIOR, ROBOT-ASSISTED
Anesthesia: General

## 2021-05-03 NOTE — Progress Notes (Signed)
Pharmacist Chemotherapy Monitoring - Initial Assessment    Anticipated start date: 05/10/21   The following has been reviewed per standard work regarding the patient's treatment regimen: The patient's diagnosis, treatment plan and drug doses, and organ/hematologic function Lab orders and baseline tests specific to treatment regimen  The treatment plan start date, drug sequencing, and pre-medications Prior authorization status  Patient's documented medication list, including drug-drug interaction screen and prescriptions for anti-emetics and supportive care specific to the treatment regimen The drug concentrations, fluid compatibility, administration routes, and timing of the medications to be used The patient's access for treatment and lifetime cumulative dose history, if applicable  The patient's medication allergies and previous infusion related reactions, if applicable   Changes made to treatment plan:  treatment plan date  Follow up needed:  N/A    Kennith Center, Pharm.D., CPP 05/03/2021@2 :49 PM

## 2021-05-04 ENCOUNTER — Telehealth: Payer: Self-pay | Admitting: Hematology

## 2021-05-04 NOTE — Telephone Encounter (Signed)
Scheduled, rescheduled, and cancelled appointments per provider's request. Patient is aware of changes.

## 2021-05-05 ENCOUNTER — Other Ambulatory Visit: Payer: Self-pay

## 2021-05-05 ENCOUNTER — Inpatient Hospital Stay: Payer: BC Managed Care – PPO

## 2021-05-09 ENCOUNTER — Encounter: Payer: Self-pay | Admitting: Hematology

## 2021-05-09 MED FILL — Dexamethasone Sodium Phosphate Inj 100 MG/10ML: INTRAMUSCULAR | Qty: 1 | Status: AC

## 2021-05-09 NOTE — Progress Notes (Signed)
Called pt to introduce myself as his Arboriculturist.  Unfortunately there aren't any foundations offering copay assistance for his Dx and the type of ins he has.  I informed him of the [employer], went over what it covers and gave him the income requirement.  He told me he's overqualified for the grant at this time.  I will request for the registration staff give him my card at his next visit for any questions or concerns he may have in the future.

## 2021-05-10 ENCOUNTER — Inpatient Hospital Stay: Payer: BC Managed Care – PPO

## 2021-05-10 ENCOUNTER — Inpatient Hospital Stay (HOSPITAL_BASED_OUTPATIENT_CLINIC_OR_DEPARTMENT_OTHER): Payer: BC Managed Care – PPO

## 2021-05-10 ENCOUNTER — Inpatient Hospital Stay (HOSPITAL_BASED_OUTPATIENT_CLINIC_OR_DEPARTMENT_OTHER): Payer: BC Managed Care – PPO | Admitting: Hematology

## 2021-05-10 ENCOUNTER — Encounter: Payer: Self-pay | Admitting: Hematology

## 2021-05-10 ENCOUNTER — Other Ambulatory Visit: Payer: Self-pay

## 2021-05-10 VITALS — BP 132/91 | HR 70 | Temp 98.7°F | Resp 16 | Ht 72.0 in | Wt 187.1 lb

## 2021-05-10 DIAGNOSIS — C20 Malignant neoplasm of rectum: Secondary | ICD-10-CM

## 2021-05-10 DIAGNOSIS — D509 Iron deficiency anemia, unspecified: Secondary | ICD-10-CM | POA: Diagnosis not present

## 2021-05-10 DIAGNOSIS — Z5111 Encounter for antineoplastic chemotherapy: Secondary | ICD-10-CM | POA: Diagnosis present

## 2021-05-10 DIAGNOSIS — D500 Iron deficiency anemia secondary to blood loss (chronic): Secondary | ICD-10-CM

## 2021-05-10 DIAGNOSIS — R1110 Vomiting, unspecified: Secondary | ICD-10-CM | POA: Diagnosis not present

## 2021-05-10 LAB — IRON AND TIBC
Iron: 26 ug/dL — ABNORMAL LOW (ref 42–163)
Saturation Ratios: 6 % — ABNORMAL LOW (ref 20–55)
TIBC: 429 ug/dL — ABNORMAL HIGH (ref 202–409)
UIBC: 403 ug/dL — ABNORMAL HIGH (ref 117–376)

## 2021-05-10 LAB — CBC WITH DIFFERENTIAL (CANCER CENTER ONLY)
Abs Immature Granulocytes: 0.01 10*3/uL (ref 0.00–0.07)
Basophils Absolute: 0 10*3/uL (ref 0.0–0.1)
Basophils Relative: 1 %
Eosinophils Absolute: 0.3 10*3/uL (ref 0.0–0.5)
Eosinophils Relative: 7 %
HCT: 31.2 % — ABNORMAL LOW (ref 39.0–52.0)
Hemoglobin: 9.7 g/dL — ABNORMAL LOW (ref 13.0–17.0)
Immature Granulocytes: 0 %
Lymphocytes Relative: 26 %
Lymphs Abs: 1.2 10*3/uL (ref 0.7–4.0)
MCH: 24.6 pg — ABNORMAL LOW (ref 26.0–34.0)
MCHC: 31.1 g/dL (ref 30.0–36.0)
MCV: 79 fL — ABNORMAL LOW (ref 80.0–100.0)
Monocytes Absolute: 0.6 10*3/uL (ref 0.1–1.0)
Monocytes Relative: 14 %
Neutro Abs: 2.4 10*3/uL (ref 1.7–7.7)
Neutrophils Relative %: 52 %
Platelet Count: 358 10*3/uL (ref 150–400)
RBC: 3.95 MIL/uL — ABNORMAL LOW (ref 4.22–5.81)
RDW: 12.9 % (ref 11.5–15.5)
WBC Count: 4.6 10*3/uL (ref 4.0–10.5)
nRBC: 0 % (ref 0.0–0.2)

## 2021-05-10 LAB — CMP (CANCER CENTER ONLY)
ALT: 20 U/L (ref 0–44)
AST: 21 U/L (ref 15–41)
Albumin: 3.9 g/dL (ref 3.5–5.0)
Alkaline Phosphatase: 50 U/L (ref 38–126)
Anion gap: 6 (ref 5–15)
BUN: 14 mg/dL (ref 6–20)
CO2: 26 mmol/L (ref 22–32)
Calcium: 8.8 mg/dL — ABNORMAL LOW (ref 8.9–10.3)
Chloride: 108 mmol/L (ref 98–111)
Creatinine: 1.08 mg/dL (ref 0.61–1.24)
GFR, Estimated: >60 mL/min (ref >60)
Glucose, Bld: 88 mg/dL (ref 70–99)
Potassium: 4.3 mmol/L (ref 3.5–5.1)
Sodium: 140 mmol/L (ref 135–145)
Total Bilirubin: 0.3 mg/dL (ref 0.3–1.2)
Total Protein: 7.1 g/dL (ref 6.5–8.1)

## 2021-05-10 LAB — FERRITIN: Ferritin: <4 ng/mL — ABNORMAL LOW (ref 24–336)

## 2021-05-10 MED ORDER — PALONOSETRON HCL INJECTION 0.25 MG/5ML
0.2500 mg | Freq: Once | INTRAVENOUS | Status: AC
  Administered 2021-05-10: 13:00:00 0.25 mg via INTRAVENOUS
  Filled 2021-05-10: qty 5

## 2021-05-10 MED ORDER — OXALIPLATIN CHEMO INJECTION 100 MG/20ML
130.0000 mg/m2 | Freq: Once | INTRAVENOUS | Status: AC
  Administered 2021-05-10: 270 mg via INTRAVENOUS
  Filled 2021-05-10: qty 54

## 2021-05-10 MED ORDER — DEXTROSE 5 % IV SOLN: Freq: Once | INTRAVENOUS | Status: AC

## 2021-05-10 MED ORDER — SODIUM CHLORIDE 0.9 % IV SOLN
10.0000 mg | Freq: Once | INTRAVENOUS | Status: AC
  Administered 2021-05-10: 13:00:00 10 mg via INTRAVENOUS
  Filled 2021-05-10: qty 10

## 2021-05-10 NOTE — Patient Instructions (Signed)
[facility]  Discharge Instructions: Thank you for choosing [facility] to provide your oncology and hematology care.   If you have a lab appointment with the Cancer Center, please go directly to the Cancer Center and check in at the registration area.   Wear comfortable clothing and clothing appropriate for easy access to any Portacath or PICC line.   We strive to give you quality time with your provider. You may need to reschedule your appointment if you arrive late (15 or more minutes).  Arriving late affects you and other patients whose appointments are after yours.  Also, if you miss three or more appointments without notifying the office, you may be dismissed from the clinic at the provider's discretion.      For prescription refill requests, have your pharmacy contact our office and allow 72 hours for refills to be completed.    Today you received the following chemotherapy and/or immunotherapy agents: Oxaliplatin      To help prevent nausea and vomiting after your treatment, we encourage you to take your nausea medication as directed.  BELOW ARE SYMPTOMS THAT SHOULD BE REPORTED IMMEDIATELY: *FEVER GREATER THAN 100.4 F (38 C) OR HIGHER *CHILLS OR SWEATING *NAUSEA AND VOMITING THAT IS NOT CONTROLLED WITH YOUR NAUSEA MEDICATION *UNUSUAL SHORTNESS OF BREATH *UNUSUAL BRUISING OR BLEEDING *URINARY PROBLEMS (pain or burning when urinating, or frequent urination) *BOWEL PROBLEMS (unusual diarrhea, constipation, pain near the anus) TENDERNESS IN MOUTH AND THROAT WITH OR WITHOUT PRESENCE OF ULCERS (sore throat, sores in mouth, or a toothache) UNUSUAL RASH, SWELLING OR PAIN  UNUSUAL VAGINAL DISCHARGE OR ITCHING   Items with * indicate a potential emergency and should be followed up as soon as possible or go to the Emergency Department if any problems should occur.  Please show the CHEMOTHERAPY ALERT CARD or IMMUNOTHERAPY ALERT CARD at check-in  to the Emergency Department and triage nurse.  Should you have questions after your visit or need to cancel or reschedule your appointment, please contact [facility]  Dept: [phone]  and follow the prompts.  Office hours are 8:00 a.m. to 4:30 p.m. Monday - Friday. Please note that voicemails left after 4:00 p.m. may not be returned until the following business day.  We are closed weekends and major holidays. You have access to a nurse at all times for urgent questions. Please call the main number to the clinic Dept: [phone] and follow the prompts.   For any non-urgent questions, you may also contact your provider using MyChart. We now offer e-Visits for anyone 18 and older to request care online for non-urgent symptoms. For details visit [email or web address].   Also download the MyChart app! Go to the app store, search "MyChart", open the app, select [facility], and log in with your MyChart username and password.  Due to Covid, a mask is required upon entering the hospital/clinic. If you do not have a mask, one will be given to you upon arrival. For doctor visits, patients may have 1 support person aged 18 or older with them. For treatment visits, patients cannot have anyone with them due to current Covid guidelines and our immunocompromised population.   Oxaliplatin Injection What is this medication? OXALIPLATIN (ox AL i PLA tin) is a chemotherapy drug. It targets fast dividing cells, like cancer cells, and causes these cells to die. This medicine is used to treat cancers of the colon and rectum, and many other cancers. This medicine may   be used for other purposes; ask your health care provider or pharmacist if you have questions. COMMON BRAND NAME(S): Eloxatin What should I tell my care team before I take this medication? They need to know if you have any of these conditions: heart disease history of irregular heartbeat liver disease low blood  counts, like white cells, platelets, or red blood cells lung or breathing disease, like asthma take medicines that treat or prevent blood clots tingling of the fingers or toes, or other nerve disorder an unusual or allergic reaction to oxaliplatin, other chemotherapy, other medicines, foods, dyes, or preservatives pregnant or trying to get pregnant breast-feeding How should I use this medication? This drug is given as an infusion into a vein. It is administered in a hospital or clinic by a specially trained health care professional. Talk to your pediatrician regarding the use of this medicine in children. Special care may be needed. Overdosage: If you think you have taken too much of this medicine contact a poison control center or emergency room at once. NOTE: This medicine is only for you. Do not share this medicine with others. What if I miss a dose? It is important not to miss a dose. Call your doctor or health care professional if you are unable to keep an appointment. What may interact with this medication? Do not take this medicine with any of the following medications: cisapride dronedarone pimozide thioridazine This medicine may also interact with the following medications: aspirin and aspirin-like medicines certain medicines that treat or prevent blood clots like warfarin, apixaban, dabigatran, and rivaroxaban cisplatin cyclosporine diuretics medicines for infection like acyclovir, adefovir, amphotericin B, bacitracin, cidofovir, foscarnet, ganciclovir, gentamicin, pentamidine, vancomycin NSAIDs, medicines for pain and inflammation, like ibuprofen or naproxen other medicines that prolong the QT interval (an abnormal heart rhythm) pamidronate zoledronic acid This list may not describe all possible interactions. Give your health care provider a list of all the medicines, herbs, non-prescription drugs, or dietary supplements you use. Also tell them if you smoke, drink alcohol,  or use illegal drugs. Some items may interact with your medicine. What should I watch for while using this medication? Your condition will be monitored carefully while you are receiving this medicine. You may need blood work done while you are taking this medicine. This medicine may make you feel generally unwell. This is not uncommon as chemotherapy can affect healthy cells as well as cancer cells. Report any side effects. Continue your course of treatment even though you feel ill unless your healthcare professional tells you to stop. This medicine can make you more sensitive to cold. Do not drink cold drinks or use ice. Cover exposed skin before coming in contact with cold temperatures or cold objects. When out in cold weather wear warm clothing and cover your mouth and nose to warm the air that goes into your lungs. Tell your doctor if you get sensitive to the cold. Do not become pregnant while taking this medicine or for 9 months after stopping it. Women should inform their health care professional if they wish to become pregnant or think they might be pregnant. Men should not father a child while taking this medicine and for 6 months after stopping it. There is potential for serious side effects to an unborn child. Talk to your health care professional for more information. Do not breast-feed a child while taking this medicine or for 3 months after stopping it. This medicine has caused ovarian failure in some women. This medicine may make   it more difficult to get pregnant. Talk to your health care professional if you are concerned about your fertility. This medicine has caused decreased sperm counts in some men. This may make it more difficult to father a child. Talk to your health care professional if you are concerned about your fertility. This medicine may increase your risk of getting an infection. Call your health care professional for advice if you get a fever, chills, or sore throat, or other  symptoms of a cold or flu. Do not treat yourself. Try to avoid being around people who are sick. Avoid taking medicines that contain aspirin, acetaminophen, ibuprofen, naproxen, or ketoprofen unless instructed by your health care professional. These medicines may hide a fever. Be careful brushing or flossing your teeth or using a toothpick because you may get an infection or bleed more easily. If you have any dental work done, tell your dentist you are receiving this medicine. What side effects may I notice from receiving this medication? Side effects that you should report to your doctor or health care professional as soon as possible: allergic reactions like skin rash, itching or hives, swelling of the face, lips, or tongue breathing problems cough low blood counts - this medicine may decrease the number of white blood cells, red blood cells, and platelets. You may be at increased risk for infections and bleeding nausea, vomiting pain, redness, or irritation at site where injected pain, tingling, numbness in the hands or feet signs and symptoms of bleeding such as bloody or black, tarry stools; red or dark brown urine; spitting up blood or brown material that looks like coffee grounds; red spots on the skin; unusual bruising or bleeding from the eyes, gums, or nose signs and symptoms of a dangerous change in heartbeat or heart rhythm like chest pain; dizziness; fast, irregular heartbeat; palpitations; feeling faint or lightheaded; falls signs and symptoms of infection like fever; chills; cough; sore throat; pain or trouble passing urine signs and symptoms of liver injury like dark yellow or brown urine; general ill feeling or flu-like symptoms; light-colored stools; loss of appetite; nausea; right upper belly pain; unusually weak or tired; yellowing of the eyes or skin signs and symptoms of low red blood cells or anemia such as unusually weak or tired; feeling faint or lightheaded; falls signs and  symptoms of muscle injury like dark urine; trouble passing urine or change in the amount of urine; unusually weak or tired; muscle pain; back pain Side effects that usually do not require medical attention (report to your doctor or health care professional if they continue or are bothersome): changes in taste diarrhea gas hair loss loss of appetite mouth sores This list may not describe all possible side effects. Call your doctor for medical advice about side effects. You may report side effects to FDA at 1-800-FDA-1088. Where should I keep my medication? This drug is given in a hospital or clinic and will not be stored at home. NOTE: This sheet is a summary. It may not cover all possible information. If you have questions about this medicine, talk to your doctor, pharmacist, or health care provider.  2022 Elsevier/Gold Standard (2021-01-31 00:00:00)  

## 2021-05-10 NOTE — Progress Notes (Signed)
[facility]   Telephone:([phone]) [phone] [phone]) [phone]   Clinic Follow up Note   Patient Care Team: Derinda Late, MD as PCP - General (Family Medicine) [name], MD as Consulting Physician (Gastroenterology) [name], MD as Consulting Physician (Hematology) [name], MD as Consulting Physician (Radiation Oncology) [name], RN as Oncology Nurse Navigator (Oncology)  Date of Service:  05/10/2021  CHIEF COMPLAINT: f/u of rectal cancer  CURRENT THERAPY:  Neoadjuvant CAPOX, with Xeloda 1571m AM/20025mPM 2 weeks on/1 week off, starting 05/10/21  ASSESSMENT & PLAN:  Jerry Allison a 89.o. male with   1. 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 He was referred to Dr. CuCandis Schatznd proceeded to colonoscopy on 03/07/21 showing a partially-obstructing tumor in proximal rectum. Biopsy confirmed adenocarcinoma and a tubular adenoma polyp. -baseline CEA that day was normal at 1.7. -staging CT CAP 03/09/21 was negative for adenopathy or metastatic disease. -pelvic MRI 03/27/21 was also negative for nodal metastasis but was not able to give T stage  -he underwent EUS on 04/27/21 with Dr. JaArdis Hughshowing: 4 cm rectal adenocarcinoma invading into and through the muscularis propria layer of rectal wall; 1.5 cm satellite nodule located 5-8 mm distal to tumor. This is staged as T3, node evaluation was not possible due to partial obstruction  -after much consideration, he has opted to proceed with total neoadjuvant therapy  -will start first cycle CAPOX today  -He reports he took 1500 mg Xeloda this morning. -Lab reviewed, adequate for treatment, will proceed to first dose oxaliplatin today.  Again reviewed the potential side effects and the management.    2. Symptom Management: epigastric pain, ?constipation, hematochezia -he reports constant epigastric pain since MRI  on 10/31 -I recommend he start miralax to keep his bowels regular, especially on oral iron. -he reports continued bleeding at this point.   3.  Iron deficient anemia -work up on 03/29/21 showed: iron 23, ferritin 4, hgb 11.1.  This is secondary to rectal bleeding from the tumor. -hgb down to 9.7 today (05/10/21). I discussed treatment with IV iron; he is agreeable.     PLAN:  -proceed with first oxaliplatin today -continue Xeloda, 1500 mg in AM and 2000 mg in PM, 2 weeks on/1 week off, started just this morning -lab and f/u on 12/19 for toxicity check -First dose of IV iron next week -lab, f/u, and oxali in 3 and 6 weeks   No problem-specific Assessment & Plan notes found for this encounter.   SUMMARY OF ONCOLOGIC HISTORY: Oncology History  [place] Procedure   Colonoscopy, Dr. CuCandis SchatzImpression - Malignant appearing partially obstructing tumor in the proximal rectum. Biopsied. Tattooed. - One 3 mm polyp in the distal rectum, removed with a cold snare. Resected and retrieved. - The examined portion of the ileum was normal. - The distal rectum and anal verge are normal on retroflexion view.   03/07/2021 Pathology Results   Diagnosis 1. Rectum, biopsy, mass - ADENOCARCINOMA - SEE COMMENT 2. Rectum, biopsy, polyp (1) - TUBULAR ADENOMA (1 OF 1 FRAGMENTS) - NO HIGH-GRADE DYSPLASIA OR MALIGNANCY IDENTIFIED   03/09/2021 Imaging   CT CAP  IMPRESSION: 1. Asymmetric wall thickening in the proximal/mid rectum may correspond to mass seen on endoscopy. 2. Negative for adenopathy or evidence of distal metastatic disease.   03/27/2021 Imaging   MRI Pelvis  IMPRESSION: 1. Low sigmoid primary, greater than  15 cm from the anal verge, as detailed above. Poorly delineated secondary to underdistention in this region. 2. No evidence of pelvic nodal metastasis. 3. Trace perisigmoid fluid, new since the prior CT.   03/28/2021 Initial Diagnosis    Adenocarcinoma of rectum ([place])   04/12/2021 Genetic Testing   Negative genetic testing on the CancerNext-Expanded+RNAinsight panel.  The report date is 04/12/2021.  The CancerNext-Expanded gene panel offered by [facility] and includes sequencing and rearrangement analysis for the following 77 genes: AIP, ALK, APC*, ATM*, AXIN2, BAP1, BARD1, BLM, BMPR1A, BRCA1*, BRCA2*, BRIP1*, CDC73, CDH1*, CDK4, CDKN1B, CDKN2A, CHEK2*, CTNNA1, DICER1, FANCC, FH, FLCN, GALNT12, KIF1B, LZTR1, MAX, MEN1, MET, MLH1*, MSH2*, MSH3, MSH6*, MUTYH*, NBN, NF1*, NF2, NTHL1, PALB2*, PHOX2B, PMS2*, POT1, PRKAR1A, PTCH1, PTEN*, RAD51C*, RAD51D*, RB1, RECQL, RET, SDHA, SDHAF2, SDHB, SDHC, SDHD, SMAD4, SMARCA4, SMARCB1, SMARCE1, STK11, SUFU, TMEM127, TP53*, TSC1, TSC2, VHL and XRCC2 (sequencing and deletion/duplication); EGFR, EGLN1, HOXB13, KIT, MITF, PDGFRA, POLD1, and POLE (sequencing only); EPCAM and GREM1 (deletion/duplication only). DNA and RNA analyses performed for * genes.    04/27/2021 Procedure   EUS, Dr. Ardis Hughs  Impression: - 4cm long, circumferential, at least uT3Nx rectal adenocarcinoma with distal edge located 10cm from the anal verge. 1.5cm satellite nodule located 5-63m distal to the tumor (around 9cm from the anal verge).   05/10/2021 -  Chemotherapy   Patient is on Treatment Plan : RECTAL Xelox (Capeox) q21d x 6 cycles        INTERVAL HISTORY:  Jerry Raffieldis here for a follow up of rectal cancer. He was last seen by me on 04/28/21. He presents to the clinic alone. He reports he is doing as well as he can for anticipating chemo.   All other systems were reviewed with the patient and are negative.  MEDICAL HISTORY:  Past Medical History:  Diagnosis Date   Anxiety    Family history of bladder cancer    [place]    SURGICAL HISTORY: Past Surgical History:  Procedure Laterality Date   EUS N/A 04/27/2021   Procedure: LOWER ENDOSCOPIC ULTRASOUND (EUS);  Surgeon: JMilus Banister MD;  [facility];  Service: Endoscopy;  Laterality: N/A;   FLEXIBLE SIGMOIDOSCOPY N/A 04/27/2021   Procedure: FLEXIBLE SIGMOIDOSCOPY;  Surgeon: JMilus Banister MD;  Location: [facility];  Service: Endoscopy;  Laterality: N/A;   NO PAST SURGERIES      I have reviewed the social history and family history with the patient and they are unchanged from previous note.  ALLERGIES:  is allergic to shellfish allergy.  MEDICATIONS:  Current Outpatient Medications  Medication Sig Dispense Refill   capecitabine (XELODA) 500 MG tablet Take 3 tabs in morning and 4 tabs in evening every 12 hours. Take after meals. 14 days on and 7 days off 98 tablet 1   dicyclomine (BENTYL) 20 MG tablet Take 1 tablet (20 mg total) by mouth 3 (three) times daily as needed for spasms. 60 tablet 1   escitalopram (LEXAPRO) 10 MG tablet Take 5 mg by mouth daily.     ondansetron (ZOFRAN) 8 MG tablet Take 1 tablet (8 mg total) by mouth 2 (two) times daily as needed for refractory nausea / vomiting. Start on day 3 after chemotherapy. 30 tablet 1   prochlorperazine (COMPAZINE) 10 MG tablet Take 1 tablet (10 mg total) by mouth every 6 (six) hours as needed (Nausea or vomiting). 30 tablet 1   No current facility-administered medications for this visit.    PHYSICAL  EXAMINATION: ECOG PERFORMANCE STATUS: 1 - Symptomatic but completely ambulatory  Vitals:   05/10/21 1210  BP: (!) 132/91  Pulse: 70  Resp: 16  Temp: 98.7 F (37.1 C)  SpO2: 100%   Wt Readings from Last 3 Encounters:  05/10/21 187 lb 1.6 oz (84.9 kg)  04/27/21 185 lb (83.9 kg)  03/29/21 187 lb 4.8 oz (85 kg)     GENERAL:alert, no distress and comfortable SKIN: skin color normal, no rashes or significant lesions EYES: normal, Conjunctiva are pink and non-injected, sclera clear  NEURO: alert & oriented x 3 with fluent speech  LABORATORY DATA:  I have reviewed the data as listed CBC Latest Ref Rng & Units 05/10/2021 03/29/2021  02/20/2021  WBC 4.0 - 10.5 K/uL 4.6 6.8 6.2  Hemoglobin 13.0 - 17.0 g/dL 9.7(L) 11.1(L) 11.7(L)  Hematocrit 39.0 - 52.0 % 31.2(L) 34.4(L) 35.9(L)  Platelets 150 - 400 K/uL 358 368 367.0     CMP Latest Ref Rng & Units 05/10/2021 03/29/2021 01/09/2021  Glucose 70 - 99 mg/dL 88 97 91  BUN 6 - 20 mg/dL 14 18 16   Creatinine 0.61 - 1.24 mg/dL 1.08 1.19 1.22  Sodium 135 - 145 mmol/L 140 139 139  Potassium 3.5 - 5.1 mmol/L 4.3 4.5 4.0  Chloride 98 - 111 mmol/L 108 106 108  CO2 22 - 32 mmol/L 26 27 25   Calcium 8.9 - 10.3 mg/dL 8.8(L) 8.9 9.0  Total Protein 6.5 - 8.1 g/dL 7.1 7.3 6.7  Total Bilirubin 0.3 - 1.2 mg/dL 0.3 <0.2(L) 0.4  Alkaline Phos 38 - 126 U/L 50 56 42  AST 15 - 41 U/L 21 23 25   ALT 0 - 44 U/L 20 33 29      RADIOGRAPHIC STUDIES: I have personally reviewed the radiological images as listed and agreed with the findings in the report. No results found.    No orders of the defined types were placed in this encounter.  All questions were answered. The patient knows to call the clinic with any problems, questions or concerns. No barriers to learning was detected.     [name], MD 05/10/2021   I, Wilburn Mylar, am acting as scribe for [name], MD.   I have reviewed the above documentation for accuracy and completeness, and I agree with the above.

## 2021-05-11 ENCOUNTER — Encounter: Payer: Self-pay | Admitting: Hematology

## 2021-05-11 ENCOUNTER — Inpatient Hospital Stay: Payer: BC Managed Care – PPO

## 2021-05-11 ENCOUNTER — Telehealth: Payer: Self-pay

## 2021-05-11 ENCOUNTER — Other Ambulatory Visit: Payer: Self-pay

## 2021-05-11 ENCOUNTER — Telehealth: Payer: Self-pay | Admitting: Hematology

## 2021-05-11 DIAGNOSIS — D630 Anemia in neoplastic disease: Secondary | ICD-10-CM

## 2021-05-11 DIAGNOSIS — Z5111 Encounter for antineoplastic chemotherapy: Secondary | ICD-10-CM | POA: Diagnosis not present

## 2021-05-11 MED ORDER — LORAZEPAM 2 MG/ML IJ SOLN
1.0000 mg | Freq: Once | INTRAMUSCULAR | Status: AC
  Administered 2021-05-11: 1 mg via INTRAVENOUS
  Filled 2021-05-11: qty 1

## 2021-05-11 MED ORDER — SODIUM CHLORIDE 0.9 % IV SOLN: 8.0000 mg | Freq: Once | INTRAVENOUS | Status: DC

## 2021-05-11 MED ORDER — DEXAMETHASONE SODIUM PHOSPHATE 10 MG/ML IJ SOLN
8.0000 mg | Freq: Once | INTRAMUSCULAR | Status: AC
  Administered 2021-05-11: 8 mg via INTRAVENOUS
  Filled 2021-05-11: qty 1

## 2021-05-11 MED ORDER — SODIUM CHLORIDE 0.9 % IV SOLN: INTRAVENOUS | Status: AC

## 2021-05-11 MED ORDER — DEXAMETHASONE 4 MG PO TABS: 8.0000 mg | ORAL_TABLET | Freq: Every day | ORAL | 0 refills | Status: AC

## 2021-05-11 NOTE — Telephone Encounter (Signed)
Scheduled follow-up appointment per 12/14 los. Patient is aware.

## 2021-05-11 NOTE — Patient Instructions (Signed)

## 2021-05-11 NOTE — Telephone Encounter (Signed)
-----   Message from Willis Modena, RN sent at 05/10/2021  5:12 PM EST ----- Regarding: Dr. Burr Medico 1st time chemo f/u tol well

## 2021-05-11 NOTE — Telephone Encounter (Signed)
Mr. Lezcano states that he has been vomiting every 2 hours since 1830 last night.  He has tried the compazine and vomits it back up. He left a message for Dr. Ernestina Penna nurse at 0900 and just let another message and has not had a return call. Mr. Waltman states that if he needs to come in to the office to be seen that he has a ride. Temp 98.4. Spoke with Dr. Burr Medico.  She will have her nurse call him back to further evaluate him.

## 2021-05-15 ENCOUNTER — Other Ambulatory Visit: Payer: Self-pay

## 2021-05-15 ENCOUNTER — Inpatient Hospital Stay (HOSPITAL_BASED_OUTPATIENT_CLINIC_OR_DEPARTMENT_OTHER): Payer: BC Managed Care – PPO | Admitting: Hematology

## 2021-05-15 ENCOUNTER — Inpatient Hospital Stay: Payer: BC Managed Care – PPO

## 2021-05-15 VITALS — BP 128/85 | HR 74 | Temp 98.6°F | Resp 18

## 2021-05-15 DIAGNOSIS — Z5111 Encounter for antineoplastic chemotherapy: Secondary | ICD-10-CM | POA: Diagnosis not present

## 2021-05-15 DIAGNOSIS — C20 Malignant neoplasm of rectum: Secondary | ICD-10-CM

## 2021-05-15 DIAGNOSIS — D630 Anemia in neoplastic disease: Secondary | ICD-10-CM

## 2021-05-15 LAB — CMP (CANCER CENTER ONLY)
ALT: 24 U/L (ref 0–44)
AST: 20 U/L (ref 15–41)
Albumin: 4.1 g/dL (ref 3.5–5.0)
Alkaline Phosphatase: 51 U/L (ref 38–126)
Anion gap: 8 (ref 5–15)
BUN: 14 mg/dL (ref 6–20)
CO2: 23 mmol/L (ref 22–32)
Calcium: 9.1 mg/dL (ref 8.9–10.3)
Chloride: 106 mmol/L (ref 98–111)
Creatinine: 1.1 mg/dL (ref 0.61–1.24)
GFR, Estimated: >60 mL/min (ref >60)
Glucose, Bld: 136 mg/dL — ABNORMAL HIGH (ref 70–99)
Potassium: 3.7 mmol/L (ref 3.5–5.1)
Sodium: 137 mmol/L (ref 135–145)
Total Bilirubin: 0.4 mg/dL (ref 0.3–1.2)
Total Protein: 7.4 g/dL (ref 6.5–8.1)

## 2021-05-15 LAB — CBC WITH DIFFERENTIAL (CANCER CENTER ONLY)
Abs Immature Granulocytes: 0.02 10*3/uL (ref 0.00–0.07)
Basophils Absolute: 0 10*3/uL (ref 0.0–0.1)
Basophils Relative: 0 %
Eosinophils Absolute: 0.3 10*3/uL (ref 0.0–0.5)
Eosinophils Relative: 5 %
HCT: 34.7 % — ABNORMAL LOW (ref 39.0–52.0)
Hemoglobin: 10.9 g/dL — ABNORMAL LOW (ref 13.0–17.0)
Immature Granulocytes: 0 %
Lymphocytes Relative: 24 %
Lymphs Abs: 1.5 10*3/uL (ref 0.7–4.0)
MCH: 24 pg — ABNORMAL LOW (ref 26.0–34.0)
MCHC: 31.4 g/dL (ref 30.0–36.0)
MCV: 76.4 fL — ABNORMAL LOW (ref 80.0–100.0)
Monocytes Absolute: 0.7 10*3/uL (ref 0.1–1.0)
Monocytes Relative: 11 %
Neutro Abs: 3.7 10*3/uL (ref 1.7–7.7)
Neutrophils Relative %: 60 %
Platelet Count: 398 10*3/uL (ref 150–400)
RBC: 4.54 MIL/uL (ref 4.22–5.81)
RDW: 12.8 % (ref 11.5–15.5)
WBC Count: 6.3 10*3/uL (ref 4.0–10.5)
nRBC: 0 % (ref 0.0–0.2)

## 2021-05-15 MED ORDER — ACETAMINOPHEN 325 MG PO TABS
650.0000 mg | ORAL_TABLET | Freq: Once | ORAL | Status: AC
  Administered 2021-05-15: 09:00:00 650 mg via ORAL
  Filled 2021-05-15: qty 2

## 2021-05-15 MED ORDER — SODIUM CHLORIDE 0.9 % IV SOLN: Freq: Once | INTRAVENOUS | Status: AC

## 2021-05-15 MED ORDER — DIPHENHYDRAMINE HCL 25 MG PO CAPS
50.0000 mg | ORAL_CAPSULE | Freq: Once | ORAL | Status: AC
  Administered 2021-05-15: 09:00:00 50 mg via ORAL
  Filled 2021-05-15: qty 2

## 2021-05-15 MED ORDER — SODIUM CHLORIDE 0.9 % IV SOLN
Freq: Once | INTRAVENOUS | Status: AC
Start: 2021-05-15 — End: 2021-05-15

## 2021-05-15 MED ORDER — SODIUM CHLORIDE 0.9 % IV SOLN
300.0000 mg | Freq: Once | INTRAVENOUS | Status: AC
  Administered 2021-05-15: 10:00:00 300 mg via INTRAVENOUS
  Filled 2021-05-15: qty 300

## 2021-05-15 NOTE — Progress Notes (Signed)
[facility]   Telephone:(336) [phone] Fax:(336) [phone]   Clinic Follow up Note   Patient Care Team: Derinda Late, MD as PCP - General (Family Medicine) [name], MD as Consulting Physician (Gastroenterology) [name], MD as Consulting Physician (Hematology) [name], MD as Consulting Physician (Radiation Oncology) [name], RN as Oncology Nurse Navigator (Oncology)  Date of Service:  05/15/2021  CHIEF COMPLAINT: f/u of rectal cancer  CURRENT THERAPY:  Neoadjuvant CAPOX, with Xeloda 1575m AM/20069mPM 2 weeks on/1 week off, starting 05/10/21  ASSESSMENT & PLAN:  Jerry Allison a [age].o. male with   1. Toxicity Check: nausea with vomiting, fatigue, weight loss -he experienced vomiting every 2 hours, started within 24 hours of first treatment. He came in the next day for IVF. -he reports he has lost about 6 lbs due to the vomiting. I have asked him to monitor at home. -he is here for IV iron today as scheduled. We will add IVF today.  2. 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 He was referred to Dr. CuCandis Schatznd proceeded to colonoscopy on 03/07/21 showing a partially-obstructing tumor in proximal rectum. Biopsy confirmed adenocarcinoma and a tubular adenoma polyp. -baseline CEA that day was normal at 1.7. -staging CT CAP 03/09/21 was negative for adenopathy or metastatic disease. -pelvic MRI 03/27/21 was also negative for nodal metastasis but was not able to give T stage  -he underwent EUS on 04/27/21 with Dr. JaArdis Hughshowing: 4 cm rectal adenocarcinoma invading into and through the muscularis propria layer of rectal wall; 1.5 cm satellite nodule located 5-8 mm distal to tumor. This is staged as T3, node evaluation was not possible due to partial obstruction  -after much consideration, he has opted to proceed with total neoadjuvant therapy  -he began  CAPOX on 05/10/21. He tolerated poorly with a lot of vomiting and fatigue. He was given IVF on day 2.  -will add iv Emend to cycle 2 and reduce Oxaliplatin dose    3. Symptom Management: epigastric pain, ?constipation, hematochezia -he reports constant epigastric pain since MRI on 10/31 -I recommend he continue or increase miralax to keep his bowels regular, especially on oral iron and zofran.   4. Iron deficient anemia -work up on 03/29/21 showed: iron 23, ferritin 4, hgb 11.1.  This is secondary to rectal bleeding from the tumor. -hgb 10.9 today (05/15/21). Will proceed with IV iron as planned.     PLAN:  -proceed with IV Venofer [id number]  -add IVF 1L over 1h -continue cycle 1 Xeloda, 1500 mg in AM and 2000 mg in PM, 2 weeks on/1 week off -lab, f/u, and oxali as scheduled on 06/01/21 and 06/21/21   No problem-specific Assessment & Plan notes found for this encounter.   SUMMARY OF ONCOLOGIC HISTORY: Oncology History  Adenocarcinoma [facility]um ([facility]/2021 Procedure   Colonoscopy, Dr. CunCandis Schatzmpression - Malignant appearing partially obstructing tumor in the proximal rectum. Biopsied. Tattooed. - One 3 mm polyp in the distal rectum, removed with a cold snare. Resected and retrieved. - The examined portion of the ileum was normal. - The distal rectum and anal verge are normal on retroflexion view.   03/07/2021 Pathology Results   Diagnosis 1. Rectum, biopsy, mass - ADENOCARCINOMA - SEE COMMENT 2. Rectum, biopsy, polyp (1) - TUBULAR ADENOMA (1 OF 1 FRAGMENTS) - NO HIGH-GRADE DYSPLASIA OR MALIGNANCY IDENTIFIED   03/09/2021 Imaging   CT  CAP  IMPRESSION: 1. Asymmetric wall thickening in the proximal/mid rectum may correspond to mass seen on endoscopy. 2. Negative for adenopathy or evidence of distal metastatic disease.   03/27/2021 Imaging   MRI Pelvis  IMPRESSION: 1. Low sigmoid primary, greater than 15 cm from the anal verge, as detailed above. Poorly  delineated secondary to underdistention in this region. 2. No evidence of pelvic nodal metastasis. 3. Trace perisigmoid fluid, new since the prior CT.   03/28/2021 Initial Diagnosis   Adenocarcinoma of rectum ([place])   04/12/2021 Genetic Testing   Negative genetic testing on the CancerNext-Expanded+RNAinsight panel.  The report date is 04/12/2021.  The CancerNext-Expanded gene panel offered by [facility] and includes sequencing and rearrangement analysis for the following 77 genes: AIP, ALK, APC*, ATM*, AXIN2, BAP1, BARD1, BLM, BMPR1A, BRCA1*, BRCA2*, BRIP1*, CDC73, CDH1*, CDK4, CDKN1B, CDKN2A, CHEK2*, CTNNA1, DICER1, FANCC, FH, FLCN, GALNT12, KIF1B, LZTR1, MAX, MEN1, MET, MLH1*, MSH2*, MSH3, MSH6*, MUTYH*, NBN, NF1*, NF2, NTHL1, PALB2*, PHOX2B, PMS2*, POT1, PRKAR1A, PTCH1, PTEN*, RAD51C*, RAD51D*, RB1, RECQL, RET, SDHA, SDHAF2, SDHB, SDHC, SDHD, SMAD4, SMARCA4, SMARCB1, SMARCE1, STK11, SUFU, TMEM127, TP53*, TSC1, TSC2, VHL and XRCC2 (sequencing and deletion/duplication); EGFR, EGLN1, HOXB13, KIT, MITF, PDGFRA, POLD1, and POLE (sequencing only); EPCAM and GREM1 (deletion/duplication only). DNA and RNA analyses performed for * genes.    04/27/2021 Procedure   EUS, Dr. Ardis Hughs  Impression: - 4cm long, circumferential, at least uT3Nx rectal adenocarcinoma with distal edge located 10cm from the anal verge. 1.5cm satellite nodule located 5-56m distal to the tumor (around 9cm from the anal verge).   05/10/2021 -  Chemotherapy   Patient is on Treatment Plan : RECTAL Xelox (Capeox) q21d x 6 cycles        INTERVAL HISTORY:  Jerry Allison here for a follow up of rectal cancer. He was last seen by me on 05/10/21. He was seen in the infusion area. He experienced a lot of vomiting following infusion. He reports some improvement after receiving IVF 05/11/21. He denies skin toxicity or mouth sores/issues so far.   All other systems were reviewed with the patient and are negative.  MEDICAL  HISTORY:  Past Medical History:  Diagnosis Date   Anxiety    Family history of bladder cancer    Rectal cancer ([facility] 02/2021    SURGICAL HISTORY: Past Surgical History:  Procedure Laterality Date   EUS N/A 04/27/2021   Procedure: LOWER ENDOSCOPIC ULTRASOUND (EUS);  Surgeon: JMilus Banister MD;  [facility];  Service: Endoscopy;  Laterality: N/A;   FLEXIBLE SIGMOIDOSCOPY N/A 04/27/2021   Procedure: FLEXIBLE SIGMOIDOSCOPY;  Surgeon: JMilus Banister MD;  [facility];  Service: Endoscopy;  Laterality: N/A;   NO PAST SURGERIES      I have reviewed the social history and family history with the patient and they are unchanged from previous note.  ALLERGIES:  is allergic to shellfish allergy.  MEDICATIONS:  Current Outpatient Medications  Medication Sig Dispense Refill   capecitabine (XELODA) 500 MG tablet Take 3 tabs in morning and 4 tabs in evening every 12 hours. Take after meals. 14 days on and 7 days off 98 tablet 1   dexamethasone (DECADRON) 4 MG tablet Take 2 tablets (8 mg total) by mouth daily. Take 884m(2 pills) with food in morning for 3 to 5 days post IV Chemotherapy. 60 tablet 0   dicyclomine (BENTYL) 20 MG tablet Take 1 tablet (20 mg total) by mouth 3 (three) times daily as needed for spasms. [place]  tablet 1   escitalopram (LEXAPRO) 10 MG tablet Take 5 mg by mouth daily.     ondansetron (ZOFRAN) 8 MG tablet Take 1 tablet (8 mg total) by mouth 2 (two) times daily as needed for refractory nausea / vomiting. Start on day 3 after chemotherapy. 30 tablet 1   prochlorperazine (COMPAZINE) 10 MG tablet Take 1 tablet (10 mg total) by mouth every 6 (six) hours as needed (Nausea or vomiting). 30 tablet 1   No current facility-administered medications for this visit.   Facility-Administered Medications Ordered in Other Visits  Medication Dose Route Frequency Provider Last Rate Last Admin   iron sucrose (VENOFER) 300 mg in sodium chloride 0.9 % 250 mL IVPB  300 mg  Intravenous Once [name], MD 176.7 mL/hr at 05/15/21 0943 300 mg at 05/15/21 0943    PHYSICAL EXAMINATION: ECOG PERFORMANCE STATUS: 1 - Symptomatic but completely ambulatory  There were no vitals filed for this visit. Wt Readings from Last 3 Encounters:  05/10/21 187 lb 1.6 oz (84.9 kg)  04/27/21 185 lb (83.9 kg)  03/29/21 187 lb 4.8 oz (85 kg)     GENERAL:alert, no distress and comfortable SKIN: skin color, texture, turgor are normal, no rashes or significant lesions EYES: normal, Conjunctiva are pink and non-injected, sclera clear OROPHARYNX:no exudate, no erythema and lips, buccal mucosa, and tongue normal NEURO: alert & oriented x 3 with fluent speech, no focal motor/sensory deficits  LABORATORY DATA:  I have reviewed the data as listed CBC Latest Ref Rng & Units 05/15/2021 05/10/2021 03/29/2021  WBC 4.0 - 10.5 K/uL 6.3 4.6 6.8  Hemoglobin 13.0 - 17.0 g/dL 10.9(L) 9.7(L) 11.1(L)  Hematocrit 39.0 - 52.0 % 34.7(L) 31.2(L) 34.4(L)  Platelets 150 - 400 K/uL 398 358 368     CMP Latest Ref Rng & Units 05/15/2021 05/10/2021 03/29/2021  Glucose 70 - 99 mg/dL 136(H) 88 97  BUN 6 - 20 mg/dL _0 Creatinine 0.61 - 1.24 mg/dL 1.10 1.08 1.19  Sodium 135 - 145 mmol/L 137 140 139  Potassium 3.5 - 5.1 mmol/L 3.7 4.3 4.5  Chloride 98 - 111 mmol/L 106 108 106  CO2 22 - 32 mmol/L _1 Calcium 8.9 - 10.3 mg/dL 9.1 8.8(L) 8.9  Total Protein 6.5 - 8.1 g/dL 7.4 7.1 7.3  Total Bilirubin 0.3 - 1.2 mg/dL 0.4 0.3 <0.2(L)  Alkaline Phos 38 - 126 U/L 51 50 56  AST 15 - 41 U/L _2 ALT 0 - 44 U/L 24 20 33      RADIOGRAPHIC STUDIES: I have personally reviewed the radiological images as listed and agreed with the findings in the report. No results found.    No orders of the defined types were placed in this encounter.  All questions were answered. The patient knows to call the clinic with any problems, questions or concerns. No barriers to learning was detected. The total  time spent in the appointment was 30 minutes.     [name], MD 05/15/2021   I, Wilburn Mylar, am acting as scribe for [name], MD.   I have reviewed the above documentation for accuracy and completeness, and I agree with the above.

## 2021-05-15 NOTE — Patient Instructions (Signed)

## 2021-05-15 NOTE — Progress Notes (Signed)
Pt stayed for 30 min observation pot iron infusion, no complaints. Pt discharged in stable condition, VSS and ambulatory to lobby.

## 2021-05-23 ENCOUNTER — Other Ambulatory Visit: Payer: BC Managed Care – PPO

## 2021-05-31 DIAGNOSIS — D500 Iron deficiency anemia secondary to blood loss (chronic): Secondary | ICD-10-CM | POA: Insufficient documentation

## 2021-05-31 MED FILL — Fosaprepitant Dimeglumine For IV Infusion 150 MG (Base Eq): INTRAVENOUS | Qty: 5 | Status: AC

## 2021-05-31 MED FILL — Dexamethasone Sodium Phosphate Inj 100 MG/10ML: INTRAMUSCULAR | Qty: 1 | Status: AC

## 2021-06-01 ENCOUNTER — Other Ambulatory Visit: Payer: BC Managed Care – PPO

## 2021-06-01 ENCOUNTER — Other Ambulatory Visit: Payer: Self-pay

## 2021-06-01 ENCOUNTER — Inpatient Hospital Stay: Payer: BC Managed Care – PPO

## 2021-06-01 ENCOUNTER — Ambulatory Visit: Payer: BC Managed Care – PPO | Admitting: Hematology

## 2021-06-01 ENCOUNTER — Encounter: Payer: Self-pay | Admitting: Hematology

## 2021-06-01 ENCOUNTER — Inpatient Hospital Stay: Payer: BC Managed Care – PPO | Attending: Hematology

## 2021-06-01 ENCOUNTER — Inpatient Hospital Stay (HOSPITAL_BASED_OUTPATIENT_CLINIC_OR_DEPARTMENT_OTHER): Payer: BC Managed Care – PPO | Admitting: Hematology

## 2021-06-01 VITALS — BP 117/80 | HR 73 | Temp 98.7°F | Resp 18 | Ht 72.0 in | Wt 183.2 lb

## 2021-06-01 DIAGNOSIS — C20 Malignant neoplasm of rectum: Secondary | ICD-10-CM | POA: Diagnosis present

## 2021-06-01 DIAGNOSIS — Z5111 Encounter for antineoplastic chemotherapy: Secondary | ICD-10-CM | POA: Insufficient documentation

## 2021-06-01 DIAGNOSIS — D500 Iron deficiency anemia secondary to blood loss (chronic): Secondary | ICD-10-CM

## 2021-06-01 LAB — CMP (CANCER CENTER ONLY)
ALT: 61 U/L — ABNORMAL HIGH (ref 0–44)
AST: 44 U/L — ABNORMAL HIGH (ref 15–41)
Albumin: 3.9 g/dL (ref 3.5–5.0)
Alkaline Phosphatase: 49 U/L (ref 38–126)
Anion gap: 5 (ref 5–15)
BUN: 12 mg/dL (ref 6–20)
CO2: 26 mmol/L (ref 22–32)
Calcium: 8.8 mg/dL — ABNORMAL LOW (ref 8.9–10.3)
Chloride: 107 mmol/L (ref 98–111)
Creatinine: 1.04 mg/dL (ref 0.61–1.24)
GFR, Estimated: >60 mL/min
Glucose, Bld: 96 mg/dL (ref 70–99)
Potassium: 4.2 mmol/L (ref 3.5–5.1)
Sodium: 138 mmol/L (ref 135–145)
Total Bilirubin: 0.3 mg/dL (ref 0.3–1.2)
Total Protein: 6.6 g/dL (ref 6.5–8.1)

## 2021-06-01 LAB — CBC WITH DIFFERENTIAL (CANCER CENTER ONLY)
Abs Immature Granulocytes: 0.01 10*3/uL (ref 0.00–0.07)
Basophils Absolute: 0 10*3/uL (ref 0.0–0.1)
Basophils Relative: 1 %
Eosinophils Absolute: 0.3 10*3/uL (ref 0.0–0.5)
Eosinophils Relative: 8 %
HCT: 32.5 % — ABNORMAL LOW (ref 39.0–52.0)
Hemoglobin: 10.4 g/dL — ABNORMAL LOW (ref 13.0–17.0)
Immature Granulocytes: 0 %
Lymphocytes Relative: 33 %
Lymphs Abs: 1.1 10*3/uL (ref 0.7–4.0)
MCH: 25.9 pg — ABNORMAL LOW (ref 26.0–34.0)
MCHC: 32 g/dL (ref 30.0–36.0)
MCV: 81 fL (ref 80.0–100.0)
Monocytes Absolute: 0.6 10*3/uL (ref 0.1–1.0)
Monocytes Relative: 19 %
Neutro Abs: 1.3 10*3/uL — ABNORMAL LOW (ref 1.7–7.7)
Neutrophils Relative %: 39 %
Platelet Count: 330 10*3/uL (ref 150–400)
RBC: 4.01 MIL/uL — ABNORMAL LOW (ref 4.22–5.81)
RDW: 19.2 % — ABNORMAL HIGH (ref 11.5–15.5)
WBC Count: 3.3 10*3/uL — ABNORMAL LOW (ref 4.0–10.5)
nRBC: 0 % (ref 0.0–0.2)

## 2021-06-01 MED ORDER — OXALIPLATIN CHEMO INJECTION 100 MG/20ML
110.0000 mg/m2 | Freq: Once | INTRAVENOUS | Status: AC
  Administered 2021-06-01: 225 mg via INTRAVENOUS
  Filled 2021-06-01: qty 40

## 2021-06-01 MED ORDER — PALONOSETRON HCL INJECTION 0.25 MG/5ML
0.2500 mg | Freq: Once | INTRAVENOUS | Status: AC
  Administered 2021-06-01: 0.25 mg via INTRAVENOUS
  Filled 2021-06-01: qty 5

## 2021-06-01 MED ORDER — SODIUM CHLORIDE 0.9 % IV SOLN
10.0000 mg | Freq: Once | INTRAVENOUS | Status: AC
  Administered 2021-06-01: 10 mg via INTRAVENOUS
  Filled 2021-06-01: qty 10

## 2021-06-01 MED ORDER — SODIUM CHLORIDE 0.9 % IV SOLN
150.0000 mg | Freq: Once | INTRAVENOUS | Status: AC
  Administered 2021-06-01: 150 mg via INTRAVENOUS
  Filled 2021-06-01: qty 150

## 2021-06-01 MED ORDER — DEXTROSE 5 % IV SOLN: Freq: Once | INTRAVENOUS | Status: AC

## 2021-06-01 NOTE — Progress Notes (Signed)
Per MD OK to trt w/ ANC of 1.3 today

## 2021-06-01 NOTE — Patient Instructions (Signed)
[facility]ICAL [facility]COLOG[facility]  Discharge Instructions: Thank you for choosing [facility] to provide your oncology and hematology care.   If you have a lab appointment with the Cancer Center, please go directly to the Cancer Center and check in at the registration area.   Wear comfortable clothing and clothing appropriate for easy access to any Portacath or PICC line.   We strive to give you quality time with your provider. You may need to reschedule your appointment if you arrive late (15 or more minutes).  Arriving late affects you and other patients whose appointments are after yours.  Also, if you miss three or more appointments without notifying the office, you may be dismissed from the clinic at the provider's discretion.      For prescription refill requests, have your pharmacy contact our office and allow 72 hours for refills to be completed.    Today you received the following chemotherapy and/or immunotherapy agents: Oxaliplatin.       To help prevent nausea and vomiting after your treatment, we encourage you to take your nausea medication as directed.  BELOW ARE SYMPTOMS THAT SHOULD BE REPORTED IMMEDIATELY: *FEVER GREATER THAN 100.4 F (38 C) OR HIGHER *CHILLS OR SWEATING *NAUSEA AND VOMITING THAT IS NOT CONTROLLED WITH YOUR NAUSEA MEDICATION *UNUSUAL SHORTNESS OF BREATH *UNUSUAL BRUISING OR BLEEDING *URINARY PROBLEMS (pain or burning when urinating, or frequent urination) *BOWEL PROBLEMS (unusual diarrhea, constipation, pain near the anus) TENDERNESS IN MOUTH AND THROAT WITH OR WITHOUT PRESENCE OF ULCERS (sore throat, sores in mouth, or a toothache) UNUSUAL RASH, SWELLING OR PAIN  UNUSUAL VAGINAL DISCHARGE OR ITCHING   Items with * indicate a potential emergency and should be followed up as soon as possible or go to the Emergency Department if any problems should occur.  Please show the CHEMOTHERAPY ALERT CARD or IMMUNOTHERAPY ALERT CARD at check-in  to the Emergency Department and triage nurse.  Should you have questions after your visit or need to cancel or reschedule your appointment, please contact [facility]  Dept: [phone]  and follow the prompts.  Office hours are 8:00 a.m. to 4:30 p.m. Monday - Friday. Please note that voicemails left after 4:00 p.m. may not be returned until the following business day.  We are closed weekends and major holidays. You have access to a nurse at all times for urgent questions. Please call the main number to the clinic Dept: [phone] and follow the prompts.   For any non-urgent questions, you may also contact your provider using MyChart. We now offer e-Visits for anyone 18 and older to request care online for non-urgent symptoms. For details visit [email or web address].   Also download the MyChart app! Go to the app store, search "MyChart", open the app, select [facility], and log in with your MyChart username and password.  Due to Covid, a mask is required upon entering the hospital/clinic. If you do not have a mask, one will be given to you upon arrival. For doctor visits, patients may have 1 support person aged 18 or older with them. For treatment visits, patients cannot have anyone with them due to current Covid guidelines and our immunocompromised population.   

## 2021-06-01 NOTE — Progress Notes (Signed)
[place]   Telephone:(336) [phone] Fax:(336) [phone]   Clinic Follow up Note   Patient Care Team: Derinda Late, MD as PCP - General (Family Medicine) [name], MD as Consulting Physician (Gastroenterology) [name], MD as Consulting Physician (Hematology) [name], MD as Consulting Physician (Radiation Oncology) [name], RN as Oncology Nurse Navigator (Oncology)  Date of Service:  06/01/2021  CHIEF COMPLAINT: f/u of rectal cancer  CURRENT THERAPY:  Neoadjuvant CAPOX, with Xeloda 1528m AM/20075mPM 2 weeks on/1 week off, starting 05/10/21  ASSESSMENT & PLAN:  Jerry Allison a [age].o. male with   1. 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 He was referred to Dr. CuCandis Schatznd proceeded to colonoscopy on 03/07/21 showing a partially-obstructing tumor in proximal rectum. Biopsy confirmed adenocarcinoma and a tubular adenoma polyp. -baseline CEA that day was normal at 1.7. -staging CT CAP 03/09/21 was negative for adenopathy or metastatic disease. -pelvic MRI 03/27/21 was also negative for nodal metastasis but was not able to give T stage  -he underwent EUS on 04/27/21 with Dr. JaArdis Hughshowing: 4 cm rectal adenocarcinoma invading into and through the muscularis propria layer of rectal wall; 1.5 cm satellite nodule located 5-8 mm distal to tumor. This is staged as T3, node evaluation was not possible due to partial obstruction  -after much consideration, he has opted to proceed with total neoadjuvant therapy  -he began CAPOX on 05/10/21. He tolerated poorly with a lot of vomiting and fatigue. He was given IVF on day 2. He was able to recover well. -labs reviewed, CBC WNL s/p chemo, CMP showed slightly elevated AST/ALT. Overall adequate to proceed with treatment. We are adding iv Emend and reducing Oxaliplatin dose today.  2. Nausea with vomiting, fatigue,  weight loss -he experienced vomiting every 2 hours, started within 24 hours of first treatment. He came in the next day for IVF. -he has recovered well  -we are adding Emend to his premeds today. He has zofran, and compazine as needed, and he will take dexa for next 3-5 days. He knows to contact [place] he again experiences excessive vomiting.   3. Symptom Management: epigastric pain, constipation, hematochezia -secondary to tumor -he reports constant epigastric pain since MRI on 10/31 -using miralax as needed for constipation -he reports his hematochezia has improved since starting treatment.   4. Iron deficient anemia -work up on 03/29/21 showed: iron 23, ferritin 4, hgb 11.1.  This is secondary to rectal bleeding from the tumor. -he received IV iron on 05/15/21. He reports he had terrible constipation following the infusion. -I recommend he take oral iron or prenatal vitamin.     PLAN:  -proceed with oxaliplatin today at reduced dose due to nausea/vomiting -continue Xeloda, 1500 mg in AM and 2000 mg in PM, 2 weeks on/1 week off -lab, f/u, and oxali as scheduled on 06/21/21   No problem-specific Assessment & Plan notes found for this encounter.   SUMMARY OF ONCOLOGIC HISTORY: Oncology History  [place]um ([place] 03/07/[place] Procedure   Colonoscopy, Dr. CuCandis SchatzImpression - Malignant appearing partially obstructing tumor in the proximal rectum. Biopsied. Tattooed. - One 3 mm polyp in the distal rectum, removed with a cold snare. Resected and retrieved. - The examined portion of the ileum was normal. - The distal rectum and anal verge are normal on retroflexion view.   03/07/2021 Pathology Results   Diagnosis 1. Rectum, biopsy, mass -  ADENOCARCINOMA - SEE COMMENT 2. Rectum, biopsy, polyp (1) - TUBULAR ADENOMA (1 OF 1 FRAGMENTS) - NO HIGH-GRADE DYSPLASIA OR MALIGNANCY IDENTIFIED   03/09/2021 Imaging   CT CAP  IMPRESSION: 1. Asymmetric wall thickening in  the proximal/mid rectum may correspond to mass seen on endoscopy. 2. Negative for adenopathy or evidence of distal metastatic disease.   03/27/2021 Imaging   MRI Pelvis  IMPRESSION: 1. Low sigmoid primary, greater than 15 cm from the anal verge, as detailed above. Poorly delineated secondary to underdistention in this region. 2. No evidence of pelvic nodal metastasis. 3. Trace perisigmoid fluid, new since the prior CT.   03/28/2021 Initial Diagnosis   Adenocarcinoma of rectum ([place])   04/12/2021 Genetic Testing   Negative genetic testing on the CancerNext-Expanded+RNAinsight panel.  The report date is 04/12/2021.  The CancerNext-Expanded gene panel offered by [facility] and includes sequencing and rearrangement analysis for the following 77 genes: AIP, ALK, APC*, ATM*, AXIN2, BAP1, BARD1, BLM, BMPR1A, BRCA1*, BRCA2*, BRIP1*, CDC73, CDH1*, CDK4, CDKN1B, CDKN2A, CHEK2*, CTNNA1, DICER1, FANCC, FH, FLCN, GALNT12, KIF1B, LZTR1, MAX, MEN1, MET, MLH1*, MSH2*, MSH3, MSH6*, MUTYH*, NBN, NF1*, NF2, NTHL1, PALB2*, PHOX2B, PMS2*, POT1, PRKAR1A, PTCH1, PTEN*, RAD51C*, RAD51D*, RB1, RECQL, RET, SDHA, SDHAF2, SDHB, SDHC, SDHD, SMAD4, SMARCA4, SMARCB1, SMARCE1, STK11, SUFU, TMEM127, TP53*, TSC1, TSC2, VHL and XRCC2 (sequencing and deletion/duplication); EGFR, EGLN1, HOXB13, KIT, MITF, PDGFRA, POLD1, and POLE (sequencing only); EPCAM and GREM1 (deletion/duplication only). DNA and RNA analyses performed for * genes.    04/27/2021 Procedure   EUS, Dr. Ardis Hughs  Impression: - 4cm long, circumferential, at least uT3Nx rectal adenocarcinoma with distal edge located 10cm from the anal verge. 1.5cm satellite nodule located 5-61m distal to the tumor (around 9cm from the anal verge).   05/10/2021 -  Chemotherapy   Patient is on Treatment Plan : RECTAL Xelox (Capeox) q21d x 6 cycles        INTERVAL HISTORY:  Jerry Allison here for a follow up of rectal cancer. He was last seen by me on 05/15/21. He  presents to the clinic alone. He reports cold sensitivity with drinking but denies to his fingers. He notes some constipation, relieved with miralax; he reports his bleeding is improving with treatment. He reports his appetite and eating improved in the last week.   All other systems were reviewed with the patient and are negative.  MEDICAL HISTORY:  Past Medical History:  Diagnosis Date   Anxiety    Family history of bladder cancer    [place] 02/[place]    SURGICAL HISTORY: Past Surgical History:  Procedure Laterality Date   EUS N/A 04/27/2021   Procedure: LOWER ENDOSCOPIC ULTRASOUND (EUS);  Surgeon: JMilus Banister MD;  [facility];  Service: Endoscopy;  Laterality: N/A;   FLEXIBLE SIGMOIDOSCOPY N/A 04/27/2021   Procedure: FLEXIBLE SIGMOIDOSCOPY;  Surgeon: JMilus Banister MD;  [facility];  Service: Endoscopy;  Laterality: N/A;   NO PAST SURGERIES      I have reviewed the social history and family history with the patient and they are unchanged from previous note.  ALLERGIES:  is allergic to shellfish allergy.  MEDICATIONS:  Current Outpatient Medications  Medication Sig Dispense Refill   capecitabine (XELODA) 500 MG tablet Take 3 tabs in morning and 4 tabs in evening every 12 hours. Take after meals. 14 days on and 7 days off 98 tablet 1   dexamethasone (DECADRON) 4 MG tablet Take 2 tablets (8 mg total) by mouth daily. Take 859m(  2 pills) with food in morning for 3 to 5 days post IV Chemotherapy. 60 tablet 0   dicyclomine (BENTYL) 20 MG tablet Take 1 tablet (20 mg total) by mouth 3 (three) times daily as needed for spasms. 60 tablet 1   escitalopram (LEXAPRO) 10 MG tablet Take 5 mg by mouth daily.     ondansetron (ZOFRAN) 8 MG tablet Take 1 tablet (8 mg total) by mouth 2 (two) times daily as needed for refractory nausea / vomiting. Start on day 3 after chemotherapy. 30 tablet 1   prochlorperazine (COMPAZINE) 10 MG tablet Take 1 tablet (10 mg  total) by mouth every 6 (six) hours as needed (Nausea or vomiting). 30 tablet 1   No current facility-administered medications for this visit.    PHYSICAL EXAMINATION: ECOG PERFORMANCE STATUS: 1 - Symptomatic but completely ambulatory  Vitals:   06/01/21 0959  BP: 117/80  Pulse: 73  Resp: 18  Temp: 98.7 F (37.1 C)  SpO2: 100%   Wt Readings from Last 3 Encounters:  06/01/21 183 lb 3.2 oz (83.1 kg)  05/10/21 187 lb 1.6 oz (84.9 kg)  04/27/21 185 lb (83.9 kg)     GENERAL:alert, no distress and comfortable SKIN: skin color normal, no rashes or significant lesions EYES: normal, Conjunctiva are pink and non-injected, sclera clear  NEURO: alert & oriented x 3 with fluent speech  LABORATORY DATA:  I have reviewed the data as listed CBC Latest Ref Rng & Units 06/01/2021 05/15/2021 05/10/2021  WBC 4.0 - 10.5 K/uL 3.3(L) 6.3 4.6  Hemoglobin 13.0 - 17.0 g/dL 10.4(L) 10.9(L) 9.7(L)  Hematocrit 39.0 - 52.0 % 32.5(L) 34.7(L) 31.2(L)  Platelets 150 - 400 K/uL 330 398 358     CMP Latest Ref Rng & Units 06/01/2021 05/15/2021 05/10/2021  Glucose 70 - 99 mg/dL 96 136(H) 88  BUN 6 - 20 mg/dL 12 14 14   Creatinine 0.61 - 1.24 mg/dL 1.04 1.10 1.08  Sodium 135 - 145 mmol/L 138 137 140  Potassium 3.5 - 5.1 mmol/L 4.2 3.7 4.3  Chloride 98 - 111 mmol/L 107 106 108  CO2 22 - 32 mmol/L 26 23 26   Calcium 8.9 - 10.3 mg/dL 8.8(L) 9.1 8.8(L)  Total Protein 6.5 - 8.1 g/dL 6.6 7.4 7.1  Total Bilirubin 0.3 - 1.2 mg/dL 0.3 0.4 0.3  Alkaline Phos 38 - 126 U/L 49 51 50  AST 15 - 41 U/L 44(H) 20 21  ALT 0 - 44 U/L 61(H) 24 20      RADIOGRAPHIC STUDIES: I have personally reviewed the radiological images as listed and agreed with the findings in the report. No results found.    No orders of the defined types were placed in this encounter.  All questions were answered. The patient knows to call the clinic with any problems, questions or concerns. No barriers to learning was detected.      [name], MD 06/01/2021   I, Wilburn Mylar, am acting as scribe for [name], MD.   I have reviewed the above documentation for accuracy and completeness, and I agree with the above.

## 2021-06-05 ENCOUNTER — Other Ambulatory Visit: Payer: BC Managed Care – PPO

## 2021-06-12 ENCOUNTER — Other Ambulatory Visit: Payer: BC Managed Care – PPO

## 2021-06-12 ENCOUNTER — Ambulatory Visit: Payer: BC Managed Care – PPO | Admitting: Hematology

## 2021-06-16 ENCOUNTER — Other Ambulatory Visit: Payer: Self-pay

## 2021-06-16 DIAGNOSIS — C20 Malignant neoplasm of rectum: Secondary | ICD-10-CM

## 2021-06-16 MED ORDER — CAPECITABINE 500 MG PO TABS: ORAL_TABLET | ORAL | 1 refills | Status: DC

## 2021-06-19 ENCOUNTER — Other Ambulatory Visit: Payer: BC Managed Care – PPO

## 2021-06-20 MED FILL — Fosaprepitant Dimeglumine For IV Infusion 150 MG (Base Eq): INTRAVENOUS | Qty: 5 | Status: AC

## 2021-06-20 MED FILL — Dexamethasone Sodium Phosphate Inj 100 MG/10ML: INTRAMUSCULAR | Qty: 1 | Status: AC

## 2021-06-21 ENCOUNTER — Other Ambulatory Visit: Payer: Self-pay

## 2021-06-21 ENCOUNTER — Inpatient Hospital Stay: Payer: BC Managed Care – PPO

## 2021-06-21 ENCOUNTER — Encounter: Payer: Self-pay | Admitting: Hematology

## 2021-06-21 ENCOUNTER — Inpatient Hospital Stay (HOSPITAL_BASED_OUTPATIENT_CLINIC_OR_DEPARTMENT_OTHER): Payer: BC Managed Care – PPO | Admitting: Hematology

## 2021-06-21 VITALS — BP 130/85 | HR 78 | Temp 98.2°F | Resp 18 | Ht 72.0 in | Wt 179.4 lb

## 2021-06-21 DIAGNOSIS — Z5111 Encounter for antineoplastic chemotherapy: Secondary | ICD-10-CM | POA: Diagnosis not present

## 2021-06-21 DIAGNOSIS — C20 Malignant neoplasm of rectum: Secondary | ICD-10-CM

## 2021-06-21 DIAGNOSIS — D500 Iron deficiency anemia secondary to blood loss (chronic): Secondary | ICD-10-CM

## 2021-06-21 LAB — CBC WITH DIFFERENTIAL (CANCER CENTER ONLY)
Abs Immature Granulocytes: 0.01 10*3/uL (ref 0.00–0.07)
Basophils Absolute: 0 10*3/uL (ref 0.0–0.1)
Basophils Relative: 0 %
Eosinophils Absolute: 0.3 10*3/uL (ref 0.0–0.5)
Eosinophils Relative: 10 %
HCT: 34.6 % — ABNORMAL LOW (ref 39.0–52.0)
Hemoglobin: 11.1 g/dL — ABNORMAL LOW (ref 13.0–17.0)
Immature Granulocytes: 0 %
Lymphocytes Relative: 37 %
Lymphs Abs: 1.2 10*3/uL (ref 0.7–4.0)
MCH: 26.2 pg (ref 26.0–34.0)
MCHC: 32.1 g/dL (ref 30.0–36.0)
MCV: 81.8 fL (ref 80.0–100.0)
Monocytes Absolute: 0.6 10*3/uL (ref 0.1–1.0)
Monocytes Relative: 19 %
Neutro Abs: 1.1 10*3/uL — ABNORMAL LOW (ref 1.7–7.7)
Neutrophils Relative %: 34 %
Platelet Count: 226 10*3/uL (ref 150–400)
RBC: 4.23 MIL/uL (ref 4.22–5.81)
RDW: 22.4 % — ABNORMAL HIGH (ref 11.5–15.5)
WBC Count: 3.3 10*3/uL — ABNORMAL LOW (ref 4.0–10.5)
nRBC: 0 % (ref 0.0–0.2)

## 2021-06-21 LAB — FERRITIN: Ferritin: 53 ng/mL (ref 24–336)

## 2021-06-21 LAB — CMP (CANCER CENTER ONLY)
ALT: 252 U/L — ABNORMAL HIGH (ref 0–44)
AST: 143 U/L — ABNORMAL HIGH (ref 15–41)
Albumin: 4 g/dL (ref 3.5–5.0)
Alkaline Phosphatase: 87 U/L (ref 38–126)
Anion gap: 7 (ref 5–15)
BUN: 11 mg/dL (ref 6–20)
CO2: 24 mmol/L (ref 22–32)
Calcium: 8.9 mg/dL (ref 8.9–10.3)
Chloride: 108 mmol/L (ref 98–111)
Creatinine: 0.94 mg/dL (ref 0.61–1.24)
GFR, Estimated: >60 mL/min (ref >60)
Glucose, Bld: 118 mg/dL — ABNORMAL HIGH (ref 70–99)
Potassium: 4 mmol/L (ref 3.5–5.1)
Sodium: 139 mmol/L (ref 135–145)
Total Bilirubin: 0.4 mg/dL (ref 0.3–1.2)
Total Protein: 6.4 g/dL — ABNORMAL LOW (ref 6.5–8.1)

## 2021-06-21 LAB — IRON AND IRON BINDING CAPACITY (CC-WL,HP ONLY)
Iron: 36 ug/dL — ABNORMAL LOW (ref 45–182)
Saturation Ratios: 9 % — ABNORMAL LOW (ref 17.9–39.5)
TIBC: 421 ug/dL (ref 250–450)
UIBC: 385 ug/dL — ABNORMAL HIGH (ref 117–376)

## 2021-06-21 MED ORDER — CAPECITABINE 500 MG PO TABS: ORAL_TABLET | ORAL | 2 refills | Status: DC

## 2021-06-21 NOTE — Progress Notes (Signed)
[place]   Telephone:(336) [phone] Fax:(336) [phone]   Clinic Follow up Note   Patient Care Team: Derinda Late, MD as PCP - General (Family Medicine) [name], MD as Consulting Physician (Gastroenterology) [name], MD as Consulting Physician (Hematology) [name], MD as Consulting Physician (Radiation Oncology) [name], RN as Oncology Nurse Navigator (Oncology)  Date of Service:  06/21/2021  CHIEF COMPLAINT: f/u of rectal cancer  CURRENT THERAPY:  Neoadjuvant CAPOX, with Xeloda 1516m AM/20079mPM 2 weeks on/1 week off, starting 05/10/21  ASSESSMENT & PLAN:  Jerry Allison a [age].o. male with   1. 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 He was referred to Dr. CuCandis Schatznd proceeded to colonoscopy on 03/07/21 showing a partially-obstructing tumor in proximal rectum. Biopsy confirmed adenocarcinoma and a tubular adenoma polyp. -baseline CEA that day was normal at 1.7. -staging CT CAP 03/09/21 was negative for adenopathy or metastatic disease. -pelvic MRI 03/27/21 was also negative for nodal metastasis but was not able to give T stage  -he underwent EUS on 04/27/21 with Dr. JaArdis Hughshowing: 4 cm rectal adenocarcinoma invading into and through the muscularis propria layer of rectal wall; 1.5 cm satellite nodule located 5-8 mm distal to tumor. This is staged as T3, node evaluation was not possible due to partial obstruction  -after much consideration, he has opted to proceed with total neoadjuvant therapy  -he began CAPOX on 05/10/21. He tolerated poorly with a lot of vomiting and fatigue. He was given IVF on day 2. He was able to recover well. He tolerated cycle 2 better with Emend and reduced oxali dose. -labs reviewed, CBC WNL s/p chemo, CMP showed worsening transaminitis, with AST 143, ALT 252, normal bilirubin alkaline phosphatase.  This is likely  related to chemotherapy, especially with Xeloda.  I will hold his chemo today, and postpone for a week.  Plan to reduce oxaliplatin dose to 100 mg/m, and Xeloda to 100 mg every 12 hours on day 1-14 every 21 days.  2. Nausea with vomiting, fatigue, weight loss, dysphagia -he experienced vomiting with long periods of standing (such as for a shower) after cycle 2; this is improved from every 2 hours after cycle 1. -he reports difficulty eating, in part due to the nausea and in part from taste change.  -he notes some increasing difficulty swallowing pills. I will give him instructions on how to crush up the Xeloda to take with liquids.   3. Symptom Management: epigastric pain, constipation, hematochezia -secondary to tumor -he reports constant epigastric pain since MRI on 10/31 -using miralax as needed for constipation -he reports his hematochezia has improved since starting treatment.   4. Iron deficient anemia -work up on 03/29/21 showed: iron 23, ferritin 4, hgb 11.1.  This is secondary to rectal bleeding from the tumor. -he received IV iron on 05/15/21. He reports he had terrible constipation following the infusion. -I recommend he take oral iron or prenatal vitamin.     PLAN:  -Lab reviewed, due to the worsening transaminitis and mild neutropenia, will hold chemotherapy today, and postpone this cycle for one week.  We will reduce oxaliplatin dose to 100 mg/m, and Xeloda to 1500 mg twice daily day 1-14 -f/u with cycle 4 in 4 weeks    No problem-specific Assessment & Plan notes found for this encounter.   SUMMARY OF ONCOLOGIC HISTORY: Oncology History  Adenocarcinoma of [place]um ([facility] 03/07/2021 Procedure  Colonoscopy, Dr. Cunningham ° °Impression °- Malignant appearing partially obstructing tumor in the proximal rectum. Biopsied. Tattooed. °- One 3 mm polyp in the distal rectum, removed with a cold snare. Resected and retrieved. °- The examined portion of the ileum was normal. °- The  distal rectum and anal verge are normal on retroflexion view. °  °03/07/2021 Pathology Results  ° Diagnosis °1. Rectum, biopsy, mass °- ADENOCARCINOMA °- SEE COMMENT °2. Rectum, biopsy, polyp (1) °- TUBULAR ADENOMA (1 OF 1 FRAGMENTS) °- NO HIGH-GRADE DYSPLASIA OR MALIGNANCY IDENTIFIED °  °03/09/2021 Imaging  ° CT CAP ° °IMPRESSION: °1. Asymmetric wall thickening in the proximal/mid rectum may °correspond to mass seen on endoscopy. °2. Negative for adenopathy or evidence of distal metastatic disease. °  °03/27/2021 Imaging  ° MRI Pelvis ° °IMPRESSION: °1. Low sigmoid primary, greater than 15 cm from the anal verge, as °detailed above. Poorly delineated secondary to underdistention in °this region. °2. No evidence of pelvic nodal metastasis. °3. Trace perisigmoid fluid, new since the prior CT. °  °03/28/2021 Initial Diagnosis  ° Adenocarcinoma of rectum (HCC) °  °03/29/2021 Cancer Staging  ° Staging form: Colon and Rectum - Neuroendocine Tumors, AJCC 8th Edition °- Clinical stage from 03/29/2021: Stage IIB (cT3, cN0, cM0) - Signed by , , MD on 06/20/2021 °Stage prefix: Initial diagnosis °Histologic grade (G): G2 °Histologic grading system: 3 grade system ° °  °04/12/2021 Genetic Testing  ° Negative genetic testing on the CancerNext-Expanded+RNAinsight panel.  The report date is 04/12/2021. ° °The CancerNext-Expanded gene panel offered by [employer] and includes sequencing and rearrangement analysis for the following 77 genes: AIP, ALK, APC*, ATM*, AXIN2, BAP1, BARD1, BLM, BMPR1A, BRCA1*, BRCA2*, BRIP1*, CDC73, CDH1*, CDK4, CDKN1B, CDKN2A, CHEK2*, CTNNA1, DICER1, FANCC, FH, FLCN, GALNT12, KIF1B, LZTR1, MAX, MEN1, MET, MLH1*, MSH2*, MSH3, MSH6*, MUTYH*, NBN, NF1*, NF2, NTHL1, PALB2*, PHOX2B, PMS2*, POT1, PRKAR1A, PTCH1, PTEN*, RAD51C*, RAD51D*, RB1, RECQL, RET, SDHA, SDHAF2, SDHB, SDHC, SDHD, SMAD4, SMARCA4, SMARCB1, SMARCE1, STK11, SUFU, TMEM127, TP53*, TSC1, TSC2, VHL and XRCC2 (sequencing and  deletion/duplication); EGFR, EGLN1, HOXB13, KIT, MITF, PDGFRA, POLD1, and POLE (sequencing only); EPCAM and GREM1 (deletion/duplication only). DNA and RNA analyses performed for * genes.  °  °04/27/2021 Procedure  ° EUS, Dr. Jacobs ° °Impression: °- 4cm long, circumferential, at least uT3Nx rectal adenocarcinoma with distal edge located 10cm from the anal verge. 1.5cm satellite nodule located 5-8mm distal to the tumor (around 9cm from the anal verge). °  °05/10/2021 -  Chemotherapy  ° Patient is on Treatment Plan : RECTAL Xelox (Capeox) q21d x 6 cycles  °   ° ° ° °INTERVAL HISTORY:  °Jerry Allison is here for a follow up of rectal cancer. He was last seen by me on 06/01/21. He presents to the clinic alone. °He reports he experienced vomiting only with extending time standing, which is improved from every 2 hours after first cycle. This caused difficulty eating for the first 4-5 days; he notes he ate simple things like soup and Ensure. He reports fluctuating constipation and diarrhea, neither of which was extreme. °He denies any skin issues from Xeloda. °  °All other systems were reviewed with the patient and are negative. ° °MEDICAL HISTORY:  °Past Medical History:  °Diagnosis Date  ° Anxiety   ° Family history of bladder cancer   ° Rectal cancer (HCC) 02/2021  ° ° °SURGICAL HISTORY: °Past Surgical History:  °Procedure Laterality Date  ° EUS N/A 04/27/2021  ° Procedure: LOWER ENDOSCOPIC ULTRASOUND (EUS);  Surgeon: Jacobs,   Daniel P, MD;  Location: [facility];  Service: Endoscopy;  Laterality: N/A;  ° FLEXIBLE SIGMOIDOSCOPY N/A 04/27/2021  ° Procedure: FLEXIBLE SIGMOIDOSCOPY;  Surgeon: Jacobs, Daniel P, MD;  Location: [facility];  Service: Endoscopy;  Laterality: N/A;  ° NO PAST SURGERIES    ° ° °I have reviewed the social history and family history with the patient and they are unchanged from previous note. ° °ALLERGIES:  is allergic to shellfish allergy. ° °MEDICATIONS:  °Current Outpatient Medications  °Medication  Sig Dispense Refill  ° capecitabine (XELODA) 500 MG tablet Take 3 tabs in morning and 4 tabs in evening every 12 hours. Take after meals. 14 days on and 7 days off 98 tablet 2  ° dicyclomine (BENTYL) 20 MG tablet Take 1 tablet (20 mg total) by mouth 3 (three) times daily as needed for spasms. 60 tablet 1  ° escitalopram (LEXAPRO) 10 MG tablet Take 5 mg by mouth daily.    ° ondansetron (ZOFRAN) 8 MG tablet Take 1 tablet (8 mg total) by mouth 2 (two) times daily as needed for refractory nausea / vomiting. Start on day 3 after chemotherapy. 30 tablet 1  ° prochlorperazine (COMPAZINE) 10 MG tablet Take 1 tablet (10 mg total) by mouth every 6 (six) hours as needed (Nausea or vomiting). 30 tablet 1  ° °No current facility-administered medications for this visit.  ° ° °PHYSICAL EXAMINATION: °ECOG PERFORMANCE STATUS: 1 - Symptomatic but completely ambulatory ° °Vitals:  ° 06/21/21 1019  °BP: 130/85  °Pulse: 78  °Resp: 18  °Temp: 98.2 °F (36.8 °C)  °SpO2: 100%  ° °Wt Readings from Last 3 Encounters:  °06/21/21 179 lb 6.4 oz (81.4 kg)  °06/01/21 183 lb 3.2 oz (83.1 kg)  °05/10/21 187 lb 1.6 oz (84.9 kg)  °  ° °GENERAL:alert, no distress and comfortable °SKIN: skin color normal, no rashes or significant lesions °EYES: normal, Conjunctiva are pink and non-injected, sclera clear  °NEURO: alert & oriented x 3 with fluent speech ° °LABORATORY DATA:  °I have reviewed the data as listed °CBC Latest Ref Rng & Units 06/21/2021 06/01/2021 05/15/2021  °WBC 4.0 - 10.5 K/uL 3.3(L) 3.3(L) 6.3  °Hemoglobin 13.0 - 17.0 g/dL 11.1(L) 10.4(L) 10.9(L)  °Hematocrit 39.0 - 52.0 % 34.6(L) 32.5(L) 34.7(L)  °Platelets 150 - 400 K/uL 226 330 398  ° ° ° °CMP Latest Ref Rng & Units 06/21/2021 06/01/2021 05/15/2021  °Glucose 70 - 99 mg/dL 118(H) 96 136(H)  °BUN 6 - 20 mg/dL 11 12 14  °Creatinine 0.61 - 1.24 mg/dL 0.94 1.04 1.10  °Sodium 135 - 145 mmol/L 139 138 137  °Potassium 3.5 - 5.1 mmol/L 4.0 4.2 3.7  °Chloride 98 - 111 mmol/L 108 107 106  °CO2 22 - 32  mmol/L 24 26 23  °Calcium 8.9 - 10.3 mg/dL 8.9 8.8(L) 9.1  °Total Protein 6.5 - 8.1 g/dL 6.4(L) 6.6 7.4  °Total Bilirubin 0.3 - 1.2 mg/dL 0.4 0.3 0.4  °Alkaline Phos 38 - 126 U/L 87 49 51  °AST 15 - 41 U/L 143(H) 44(H) 20  °ALT 0 - 44 U/L 252(H) 61(H) 24  ° ° ° ° °RADIOGRAPHIC STUDIES: °I have personally reviewed the radiological images as listed and agreed with the findings in the report. °No results found.  ° ° °No orders of the defined types were placed in this encounter. ° °All questions were answered. The patient knows to call the clinic with any problems, questions or concerns. No barriers to learning was detected. °The total time spent   in the appointment was 30 minutes. ° °  °  , MD °06/21/2021  ° °I, Katie Daubenspeck, am acting as scribe for  , MD.  ° °I have reviewed the above documentation for accuracy and completeness, and I agree with the above. °  ° ° °

## 2021-06-21 NOTE — Progress Notes (Signed)
Today's ANC 1.4,  AST 143, ALT 252 - per Dr. Burr Medico no tx today.

## 2021-06-21 NOTE — Progress Notes (Signed)
Chemotherapy held today d/t elevated LFTs and ANC 1.4 per Dr. Burr Medico.  Dr. Burr Medico deferred treatment for 1wk; therefore, pt will resume tx on 06/28/2021 or 06/29/2021.  Pt will hold Capecitabine today until next week 06/28/2021 or 06/29/2021 and his labs have resulted (LFTs).  Dr. Burr Medico changed/reduced pt's Capecitabine to 3 tabs per day Q12hrs for days 1-14.  Dr. Burr Medico will not have f/u appt with pt next week.

## 2021-06-28 ENCOUNTER — Other Ambulatory Visit: Payer: Self-pay | Admitting: Hematology

## 2021-06-28 ENCOUNTER — Inpatient Hospital Stay: Payer: BC Managed Care – PPO

## 2021-06-28 ENCOUNTER — Other Ambulatory Visit: Payer: Self-pay

## 2021-06-28 ENCOUNTER — Inpatient Hospital Stay: Payer: BC Managed Care – PPO | Attending: Hematology

## 2021-06-28 ENCOUNTER — Telehealth: Payer: Self-pay

## 2021-06-28 ENCOUNTER — Telehealth: Payer: Self-pay | Admitting: Hematology

## 2021-06-28 DIAGNOSIS — Z5111 Encounter for antineoplastic chemotherapy: Secondary | ICD-10-CM | POA: Insufficient documentation

## 2021-06-28 DIAGNOSIS — C20 Malignant neoplasm of rectum: Secondary | ICD-10-CM | POA: Diagnosis present

## 2021-06-28 DIAGNOSIS — D509 Iron deficiency anemia, unspecified: Secondary | ICD-10-CM | POA: Insufficient documentation

## 2021-06-28 DIAGNOSIS — R7989 Other specified abnormal findings of blood chemistry: Secondary | ICD-10-CM

## 2021-06-28 LAB — CBC WITH DIFFERENTIAL (CANCER CENTER ONLY)
Abs Immature Granulocytes: 0.01 10*3/uL (ref 0.00–0.07)
Basophils Absolute: 0 10*3/uL (ref 0.0–0.1)
Basophils Relative: 1 %
Eosinophils Absolute: 0.5 10*3/uL (ref 0.0–0.5)
Eosinophils Relative: 14 %
HCT: 37.6 % — ABNORMAL LOW (ref 39.0–52.0)
Hemoglobin: 12 g/dL — ABNORMAL LOW (ref 13.0–17.0)
Immature Granulocytes: 0 %
Lymphocytes Relative: 36 %
Lymphs Abs: 1.4 10*3/uL (ref 0.7–4.0)
MCH: 26 pg (ref 26.0–34.0)
MCHC: 31.9 g/dL (ref 30.0–36.0)
MCV: 81.6 fL (ref 80.0–100.0)
Monocytes Absolute: 0.9 10*3/uL (ref 0.1–1.0)
Monocytes Relative: 22 %
Neutro Abs: 1 10*3/uL — ABNORMAL LOW (ref 1.7–7.7)
Neutrophils Relative %: 27 %
Platelet Count: 258 10*3/uL (ref 150–400)
RBC: 4.61 MIL/uL (ref 4.22–5.81)
RDW: 22.7 % — ABNORMAL HIGH (ref 11.5–15.5)
WBC Count: 3.9 10*3/uL — ABNORMAL LOW (ref 4.0–10.5)
nRBC: 0 % (ref 0.0–0.2)

## 2021-06-28 LAB — CMP (CANCER CENTER ONLY)
ALT: 499 U/L (ref 0–44)
AST: 255 U/L (ref 15–41)
Albumin: 4.2 g/dL (ref 3.5–5.0)
Alkaline Phosphatase: 84 U/L (ref 38–126)
Anion gap: 5 (ref 5–15)
BUN: 11 mg/dL (ref 6–20)
CO2: 28 mmol/L (ref 22–32)
Calcium: 9.4 mg/dL (ref 8.9–10.3)
Chloride: 107 mmol/L (ref 98–111)
Creatinine: 1.03 mg/dL (ref 0.61–1.24)
GFR, Estimated: >60 mL/min (ref >60)
Glucose, Bld: 111 mg/dL — ABNORMAL HIGH (ref 70–99)
Potassium: 3.9 mmol/L (ref 3.5–5.1)
Sodium: 140 mmol/L (ref 135–145)
Total Bilirubin: 0.4 mg/dL (ref 0.3–1.2)
Total Protein: 6.9 g/dL (ref 6.5–8.1)

## 2021-06-28 NOTE — Telephone Encounter (Signed)
CRITICAL VALUE STICKER  CRITICAL VALUE: ALT = 499, AST = 255  RECEIVER (on-site recipient of call): Yetta Glassman, [place] NOTIFIED: 06/28/21 at 1:02pm  MESSENGER (representative from lab): Ulice Dash  MD NOTIFIED: FENG  TIME OF NOTIFICATION: 06/28/21 at 1:10pm  RESPONSE: Notification given to [place], LPN for follow-up with pt and provider.

## 2021-06-28 NOTE — Telephone Encounter (Signed)
Sch per 2/1 inbasket,pt aware °

## 2021-06-28 NOTE — Progress Notes (Signed)
Pt scheduled at [facility] for [place] of Abdomen/Liver on 06/29/2021 @2pm .  Pt is to be NPO 6to8hrs prior to appt.  Spoke with pt via telephone to confirm appt for 06/29/2021.  Pt confirmed pt. Appt. And had no further questions or concerns.

## 2021-06-29 ENCOUNTER — Ambulatory Visit (HOSPITAL_COMMUNITY)
Admission: RE | Admit: 2021-06-29 | Discharge: 2021-06-29 | Disposition: A | Discharge status: Home / Self Care | Payer: BC Managed Care – PPO | Source: Ambulatory Visit | Attending: Hematology | Admitting: Hematology

## 2021-06-29 DIAGNOSIS — R7989 Other specified abnormal findings of blood chemistry: Secondary | ICD-10-CM | POA: Insufficient documentation

## 2021-07-03 ENCOUNTER — Other Ambulatory Visit: Payer: Self-pay

## 2021-07-06 ENCOUNTER — Other Ambulatory Visit: Payer: Self-pay

## 2021-07-06 ENCOUNTER — Encounter: Payer: Self-pay | Admitting: Hematology

## 2021-07-06 ENCOUNTER — Inpatient Hospital Stay: Payer: BC Managed Care – PPO

## 2021-07-06 ENCOUNTER — Inpatient Hospital Stay (HOSPITAL_BASED_OUTPATIENT_CLINIC_OR_DEPARTMENT_OTHER): Payer: BC Managed Care – PPO | Admitting: Hematology

## 2021-07-06 VITALS — BP 140/93 | HR 82 | Temp 98.6°F | Resp 16 | Ht 72.0 in | Wt 180.9 lb

## 2021-07-06 DIAGNOSIS — Z5111 Encounter for antineoplastic chemotherapy: Secondary | ICD-10-CM | POA: Diagnosis not present

## 2021-07-06 DIAGNOSIS — C20 Malignant neoplasm of rectum: Secondary | ICD-10-CM | POA: Diagnosis not present

## 2021-07-06 LAB — CBC WITH DIFFERENTIAL (CANCER CENTER ONLY)
Abs Immature Granulocytes: 0.01 10*3/uL (ref 0.00–0.07)
Basophils Absolute: 0 10*3/uL (ref 0.0–0.1)
Basophils Relative: 1 %
Eosinophils Absolute: 0.5 10*3/uL (ref 0.0–0.5)
Eosinophils Relative: 12 %
HCT: 40.5 % (ref 39.0–52.0)
Hemoglobin: 12.9 g/dL — ABNORMAL LOW (ref 13.0–17.0)
Immature Granulocytes: 0 %
Lymphocytes Relative: 39 %
Lymphs Abs: 1.7 10*3/uL (ref 0.7–4.0)
MCH: 26 pg (ref 26.0–34.0)
MCHC: 31.9 g/dL (ref 30.0–36.0)
MCV: 81.5 fL (ref 80.0–100.0)
Monocytes Absolute: 0.8 10*3/uL (ref 0.1–1.0)
Monocytes Relative: 18 %
Neutro Abs: 1.3 10*3/uL — ABNORMAL LOW (ref 1.7–7.7)
Neutrophils Relative %: 30 %
Platelet Count: 281 10*3/uL (ref 150–400)
RBC: 4.97 MIL/uL (ref 4.22–5.81)
RDW: 22.5 % — ABNORMAL HIGH (ref 11.5–15.5)
WBC Count: 4.2 10*3/uL (ref 4.0–10.5)
nRBC: 0 % (ref 0.0–0.2)

## 2021-07-06 LAB — CMP (CANCER CENTER ONLY)
ALT: 332 U/L (ref 0–44)
AST: 117 U/L — ABNORMAL HIGH (ref 15–41)
Albumin: 4.4 g/dL (ref 3.5–5.0)
Alkaline Phosphatase: 82 U/L (ref 38–126)
Anion gap: 6 (ref 5–15)
BUN: 10 mg/dL (ref 6–20)
CO2: 26 mmol/L (ref 22–32)
Calcium: 9.7 mg/dL (ref 8.9–10.3)
Chloride: 108 mmol/L (ref 98–111)
Creatinine: 1.08 mg/dL (ref 0.61–1.24)
GFR, Estimated: >60 mL/min (ref >60)
Glucose, Bld: 97 mg/dL (ref 70–99)
Potassium: 4.1 mmol/L (ref 3.5–5.1)
Sodium: 140 mmol/L (ref 135–145)
Total Bilirubin: 0.4 mg/dL (ref 0.3–1.2)
Total Protein: 7.2 g/dL (ref 6.5–8.1)

## 2021-07-06 NOTE — Progress Notes (Signed)
[place]   Telephone:(336) [phone] Fax:(336) [phone]   Clinic Follow up Note   Patient Care Team: Derinda Late, MD as PCP - General (Family Medicine) [name], MD as Consulting Physician (Gastroenterology) [name], MD as Consulting Physician (Hematology) [name], MD as Consulting Physician (Radiation Oncology) [name], RN as Oncology Nurse Navigator (Oncology)  Date of Service:  07/06/2021  CHIEF COMPLAINT: f/u of rectal cancer  CURRENT THERAPY:  Neoadjuvant CAPOX, with Xeloda 1536m AM/20029mPM 2 weeks on/1 week off, starting 05/10/21  ASSESSMENT & PLAN:  Jerry Allison a [age].o. male with   1. 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 He was referred to Dr. CuCandis Schatznd proceeded to colonoscopy on 03/07/21 showing a partially-obstructing tumor in proximal rectum. Biopsy confirmed adenocarcinoma and a tubular adenoma polyp. -baseline CEA that day was normal at 1.7. -staging CT CAP 03/09/21 was negative for adenopathy or metastatic disease. -pelvic MRI 03/27/21 was also negative for nodal metastasis but was not able to give T stage  -he underwent EUS on 04/27/21 with Dr. JaArdis Hughshowing: 4 cm rectal adenocarcinoma invading into and through the muscularis propria layer of rectal wall; 1.5 cm satellite nodule located 5-8 mm distal to tumor. This is staged as T3, node evaluation was not possible due to partial obstruction  -after much consideration, he has opted to proceed with total neoadjuvant therapy  -he began CAPOX on 05/10/21. He tolerated poorly with a lot of vomiting and fatigue. He was given IVF on day 2. He was able to recover well. He tolerated cycle 2 better with Emend and reduced oxali dose. -we held cycle 3 due to worsening transaminitis. Abdomen [place] 06/29/21 was negative for metastatic disease, showed only mild steatosis. -labs reviewed,  CMP showed improving transaminitis compared to 06/28/21-- AST 117, ALT 332. We discussed continuing treatment with dose reduction, or moving to radiation, or proceeding with surgery. We will continue to hold his treatment for now and plan to restart in 2 weeks with dose reduction. If his liver function worsens again, we will move to radiation.   2. Chemo Side Effects: Nausea with vomiting, fatigue, weight loss, dysphagia -he experienced vomiting with long periods of standing (such as for a shower) after cycle 2; this is improved from every 2 hours after cycle 1. -he reports difficulty eating, in part due to the nausea and in part from taste change.  -he notes some increasing difficulty swallowing pills. I will give him instructions on how to crush up the Xeloda to take with liquids.   3. Symptom Management: epigastric pain, constipation, hematochezia -secondary to tumor -he reports constant epigastric pain since MRI on 10/31,this is resolved now  -using miralax as needed for constipation -he reports his hematochezia has near resolved since starting treatment.   4. Iron deficient anemia -work up on 03/29/21 showed: iron 23, ferritin 4, hgb 11.1.  This is secondary to rectal bleeding from the tumor. -he received IV iron on 05/15/21. He reports he had terrible constipation following the infusion. -I recommend he take oral iron or prenatal vitamin.     PLAN:  -hold chemotherapy today, and postpone this cycle for two weeks.  We will reduce oxaliplatin dose to 100 mg/m, and Xeloda to 1500 mg twice daily day 1-14 on next cycle  -f/u with cycle 3 in 2 weeks    No problem-specific Assessment & Plan notes found for this encounter.  SUMMARY OF ONCOLOGIC HISTORY: Oncology History  Adenocarcinoma of rectum ([place])  03/07/2021 Procedure   Colonoscopy, Dr. [name]  Impression - Malignant appearing partially obstructing tumor in the proximal rectum. Biopsied. Tattooed. - One 3 mm polyp in the  distal rectum, removed with a cold snare. Resected and retrieved. - The examined portion of the ileum was normal. - The distal rectum and anal verge are normal on retroflexion view.   03/07/2021 Pathology Results   Diagnosis 1. Rectum, biopsy, mass - ADENOCARCINOMA - SEE COMMENT 2. Rectum, biopsy, polyp (1) - TUBULAR ADENOMA (1 OF 1 FRAGMENTS) - NO HIGH-GRADE DYSPLASIA OR MALIGNANCY IDENTIFIED   03/09/2021 Imaging   CT CAP  IMPRESSION: 1. Asymmetric wall thickening in the proximal/mid rectum may correspond to mass seen on endoscopy. 2. Negative for adenopathy or evidence of distal metastatic disease.   03/27/2021 Imaging   MRI Pelvis  IMPRESSION: 1. Low sigmoid primary, greater than 15 cm from the anal verge, as detailed above. Poorly delineated secondary to underdistention in this region. 2. No evidence of pelvic nodal metastasis. 3. Trace perisigmoid fluid, new since the prior CT.   03/28/2021 Initial Diagnosis   Adenocarcinoma of rectum ([name])   03/29/2021 Cancer Staging   Staging form: Colon and Rectum - Neuroendocine Tumors, AJCC 8th Edition - Clinical stage from 03/29/2021: Stage IIB (cT3, cN0, cM0) - Signed by [name], MD on 06/20/2021 Stage prefix: Initial diagnosis Histologic grade (G): G2 Histologic grading system: 3 grade system    04/12/2021 Genetic Testing   Negative genetic testing on the CancerNext-Expanded+RNAinsight panel.  The report date is 04/12/2021.  The CancerNext-Expanded gene panel offered by [facility] and includes sequencing and rearrangement analysis for the following 77 genes: AIP, ALK, APC*, ATM*, AXIN2, BAP1, BARD1, BLM, BMPR1A, BRCA1*, BRCA2*, BRIP1*, CDC73, CDH1*, CDK4, CDKN1B, CDKN2A, CHEK2*, CTNNA1, DICER1, FANCC, FH, FLCN, GALNT12, KIF1B, LZTR1, MAX, MEN1, MET, MLH1*, MSH2*, MSH3, MSH6*, MUTYH*, NBN, NF1*, NF2, NTHL1, PALB2*, PHOX2B, PMS2*, POT1, PRKAR1A, PTCH1, PTEN*, RAD51C*, RAD51D*, RB1, RECQL, RET, SDHA, SDHAF2, SDHB, SDHC, SDHD,  SMAD4, SMARCA4, SMARCB1, SMARCE1, STK11, SUFU, TMEM127, TP53*, TSC1, TSC2, VHL and XRCC2 (sequencing and deletion/duplication); EGFR, EGLN1, HOXB13, KIT, MITF, PDGFRA, POLD1, and POLE (sequencing only); EPCAM and GREM1 (deletion/duplication only). DNA and RNA analyses performed for * genes.    04/27/2021 Procedure   EUS, Dr. Ardis Hughs  Impression: - 4cm long, circumferential, at least uT3Nx rectal adenocarcinoma with distal edge located 10cm from the anal verge. 1.5cm satellite nodule located 5-74m distal to the tumor (around 9cm from the anal verge).   05/10/2021 -  Chemotherapy   Patient is on Treatment Plan : RECTAL Xelox (Capeox) q21d x 6 cycles        INTERVAL HISTORY:  Jerry Allison here for a follow up of rectal cancer. He was last seen by me on 06/21/21. He presents to the clinic alone.   All other systems were reviewed with the patient and are negative.  MEDICAL HISTORY:  Past Medical History:  Diagnosis Date   Anxiety    Family history of bladder cancer    Rectal cancer ([facility] 02/2021    SURGICAL HISTORY: Past Surgical History:  Procedure Laterality Date   EUS N/A 04/27/2021   Procedure: LOWER ENDOSCOPIC ULTRASOUND (EUS);  Surgeon: JMilus Banister MD;  [facility];  Service: Endoscopy;  Laterality: N/A;   FLEXIBLE SIGMOIDOSCOPY N/A 04/27/2021   Procedure: FLEXIBLE SIGMOIDOSCOPY;  Surgeon: JMilus Banister MD;  Location: [facility];  Service: Endoscopy;  Laterality: N/A;  NO PAST SURGERIES      I have reviewed the social history and family history with the patient and they are unchanged from previous note.  ALLERGIES:  is allergic to shellfish allergy.  MEDICATIONS:  Current Outpatient Medications  Medication Sig Dispense Refill   capecitabine (XELODA) 500 MG tablet Take 3 tabs in morning and 4 tabs in evening every 12 hours. Take after meals. 14 days on and 7 days off 98 tablet 2   dicyclomine (BENTYL) 20 MG tablet Take 1 tablet (20 mg total) by  mouth 3 (three) times daily as needed for spasms. 60 tablet 1   escitalopram (LEXAPRO) 10 MG tablet Take 5 mg by mouth daily.     ondansetron (ZOFRAN) 8 MG tablet Take 1 tablet (8 mg total) by mouth 2 (two) times daily as needed for refractory nausea / vomiting. Start on day 3 after chemotherapy. 30 tablet 1   prochlorperazine (COMPAZINE) 10 MG tablet Take 1 tablet (10 mg total) by mouth every 6 (six) hours as needed (Nausea or vomiting). 30 tablet 1   No current facility-administered medications for this visit.    PHYSICAL EXAMINATION: ECOG PERFORMANCE STATUS: 0 - Asymptomatic  Vitals:   07/06/21 1137  BP: (!) 140/93  Pulse: 82  Resp: 16  Temp: 98.6 F (37 C)  SpO2: 100%   Wt Readings from Last 3 Encounters:  07/06/21 180 lb 14.4 oz (82.1 kg)  06/21/21 179 lb 6.4 oz (81.4 kg)  06/01/21 183 lb 3.2 oz (83.1 kg)     GENERAL:alert, no distress and comfortable SKIN: skin color normal, no rashes or significant lesions EYES: normal, Conjunctiva are pink and non-injected, sclera clear  NEURO: alert & oriented x 3 with fluent speech  LABORATORY DATA:  I have reviewed the data as listed CBC Latest Ref Rng & Units 07/06/2021 06/28/2021 06/21/2021  WBC 4.0 - 10.5 K/uL 4.2 3.9(L) 3.3(L)  Hemoglobin 13.0 - 17.0 g/dL 12.9(L) 12.0(L) 11.1(L)  Hematocrit 39.0 - 52.0 % 40.5 37.6(L) 34.6(L)  Platelets 150 - 400 K/uL 281 258 226     CMP Latest Ref Rng & Units 07/06/2021 06/28/2021 06/21/2021  Glucose 70 - 99 mg/dL 97 111(H) 118(H)  BUN 6 - 20 mg/dL 10 11 11   Creatinine 0.61 - 1.24 mg/dL 1.08 1.03 0.94  Sodium 135 - 145 mmol/L 140 140 139  Potassium 3.5 - 5.1 mmol/L 4.1 3.9 4.0  Chloride 98 - 111 mmol/L 108 107 108  CO2 22 - 32 mmol/L 26 28 24   Calcium 8.9 - 10.3 mg/dL 9.7 9.4 8.9  Total Protein 6.5 - 8.1 g/dL 7.2 6.9 6.4(L)  Total Bilirubin 0.3 - 1.2 mg/dL 0.4 0.4 0.4  Alkaline Phos 38 - 126 U/L 82 84 87  AST 15 - 41 U/L 117(H) 255(HH) 143(H)  ALT 0 - 44 U/L 332(HH) 499(HH) 252(H)       RADIOGRAPHIC STUDIES: I have personally reviewed the radiological images as listed and agreed with the findings in the report. No results found.    No orders of the defined types were placed in this encounter.  All questions were answered. The patient knows to call the clinic with any problems, questions or concerns. No barriers to learning was detected. The total time spent in the appointment was 25 minutes.     [name], MD 07/06/2021   I, Wilburn Mylar, am acting as scribe for [name], MD.   I have reviewed the above documentation for accuracy and completeness, and I agree with the  above.

## 2021-07-07 ENCOUNTER — Telehealth: Payer: Self-pay

## 2021-07-07 NOTE — Telephone Encounter (Signed)
Spoke with pt via telephone regarding [place] results.  Pt stated Dr. Burr Medico went over results with him at his last appt with her but he did not understand the results.  Informed pt that the [place] was negative for metastatic liver disease but does shows he have a fatty liver.  Explained to pt some of the causes of fatty liver.  Pt verbalized understanding and had no further questions or concerns at this time.

## 2021-07-12 ENCOUNTER — Other Ambulatory Visit (HOSPITAL_COMMUNITY): Payer: Self-pay

## 2021-07-13 ENCOUNTER — Ambulatory Visit: Payer: BC Managed Care – PPO | Admitting: Hematology

## 2021-07-13 ENCOUNTER — Other Ambulatory Visit: Payer: BC Managed Care – PPO

## 2021-07-13 ENCOUNTER — Ambulatory Visit: Payer: BC Managed Care – PPO

## 2021-07-17 ENCOUNTER — Other Ambulatory Visit: Payer: Self-pay

## 2021-07-20 ENCOUNTER — Inpatient Hospital Stay: Payer: BC Managed Care – PPO

## 2021-07-20 ENCOUNTER — Encounter: Payer: Self-pay | Admitting: Hematology

## 2021-07-20 ENCOUNTER — Inpatient Hospital Stay (HOSPITAL_BASED_OUTPATIENT_CLINIC_OR_DEPARTMENT_OTHER): Payer: BC Managed Care – PPO | Admitting: Hematology

## 2021-07-20 ENCOUNTER — Other Ambulatory Visit: Payer: Self-pay

## 2021-07-20 VITALS — BP 124/86 | HR 83 | Temp 98.6°F | Resp 16 | Ht 72.0 in | Wt 184.5 lb

## 2021-07-20 DIAGNOSIS — D500 Iron deficiency anemia secondary to blood loss (chronic): Secondary | ICD-10-CM

## 2021-07-20 DIAGNOSIS — C20 Malignant neoplasm of rectum: Secondary | ICD-10-CM

## 2021-07-20 DIAGNOSIS — Z5111 Encounter for antineoplastic chemotherapy: Secondary | ICD-10-CM | POA: Diagnosis not present

## 2021-07-20 LAB — CBC WITH DIFFERENTIAL (CANCER CENTER ONLY)
Abs Immature Granulocytes: 0.01 10*3/uL (ref 0.00–0.07)
Basophils Absolute: 0 10*3/uL (ref 0.0–0.1)
Basophils Relative: 1 %
Eosinophils Absolute: 0.5 10*3/uL (ref 0.0–0.5)
Eosinophils Relative: 9 %
HCT: 38.7 % — ABNORMAL LOW (ref 39.0–52.0)
Hemoglobin: 12.4 g/dL — ABNORMAL LOW (ref 13.0–17.0)
Immature Granulocytes: 0 %
Lymphocytes Relative: 35 %
Lymphs Abs: 1.8 10*3/uL (ref 0.7–4.0)
MCH: 26.1 pg (ref 26.0–34.0)
MCHC: 32 g/dL (ref 30.0–36.0)
MCV: 81.5 fL (ref 80.0–100.0)
Monocytes Absolute: 0.6 10*3/uL (ref 0.1–1.0)
Monocytes Relative: 11 %
Neutro Abs: 2.3 10*3/uL (ref 1.7–7.7)
Neutrophils Relative %: 44 %
Platelet Count: 297 10*3/uL (ref 150–400)
RBC: 4.75 MIL/uL (ref 4.22–5.81)
RDW: 22.4 % — ABNORMAL HIGH (ref 11.5–15.5)
WBC Count: 5.2 10*3/uL (ref 4.0–10.5)
nRBC: 0 % (ref 0.0–0.2)

## 2021-07-20 LAB — CMP (CANCER CENTER ONLY)
ALT: 84 U/L — ABNORMAL HIGH (ref 0–44)
AST: 44 U/L — ABNORMAL HIGH (ref 15–41)
Albumin: 4.2 g/dL (ref 3.5–5.0)
Alkaline Phosphatase: 60 U/L (ref 38–126)
Anion gap: 7 (ref 5–15)
BUN: 13 mg/dL (ref 6–20)
CO2: 24 mmol/L (ref 22–32)
Calcium: 9.2 mg/dL (ref 8.9–10.3)
Chloride: 107 mmol/L (ref 98–111)
Creatinine: 1.08 mg/dL (ref 0.61–1.24)
GFR, Estimated: >60 mL/min (ref >60)
Glucose, Bld: 160 mg/dL — ABNORMAL HIGH (ref 70–99)
Potassium: 3.8 mmol/L (ref 3.5–5.1)
Sodium: 138 mmol/L (ref 135–145)
Total Bilirubin: 0.4 mg/dL (ref 0.3–1.2)
Total Protein: 6.8 g/dL (ref 6.5–8.1)

## 2021-07-20 LAB — FERRITIN: Ferritin: 15 ng/mL — ABNORMAL LOW (ref 24–336)

## 2021-07-20 MED ORDER — SODIUM CHLORIDE 0.9 % IV SOLN
150.0000 mg | Freq: Once | INTRAVENOUS | Status: AC
  Administered 2021-07-20: 150 mg via INTRAVENOUS
  Filled 2021-07-20: qty 150

## 2021-07-20 MED ORDER — PALONOSETRON HCL INJECTION 0.25 MG/5ML
0.2500 mg | Freq: Once | INTRAVENOUS | Status: AC
  Administered 2021-07-20: 0.25 mg via INTRAVENOUS
  Filled 2021-07-20: qty 5

## 2021-07-20 MED ORDER — OXALIPLATIN CHEMO INJECTION 100 MG/20ML
100.0000 mg/m2 | Freq: Once | INTRAVENOUS | Status: AC
  Administered 2021-07-20: 205 mg via INTRAVENOUS
  Filled 2021-07-20: qty 40

## 2021-07-20 MED ORDER — SODIUM CHLORIDE 0.9% FLUSH: 10.0000 mL | INTRAVENOUS | Status: DC | PRN

## 2021-07-20 MED ORDER — DEXTROSE 5 % IV SOLN: Freq: Once | INTRAVENOUS | Status: AC

## 2021-07-20 MED ORDER — HEPARIN SOD (PORK) LOCK FLUSH 100 UNIT/ML IV SOLN: 500.0000 [IU] | Freq: Once | INTRAVENOUS | Status: DC | PRN

## 2021-07-20 MED ORDER — SODIUM CHLORIDE 0.9 % IV SOLN
10.0000 mg | Freq: Once | INTRAVENOUS | Status: AC
  Administered 2021-07-20: 10 mg via INTRAVENOUS
  Filled 2021-07-20: qty 10

## 2021-07-20 NOTE — Progress Notes (Signed)
Per Dr Burr Medico, proceed with treatment today with elevated ALT of 84 U/L

## 2021-07-20 NOTE — Patient Instructions (Signed)
[facility]   ?Discharge Instructions: ?Thank you for choosing [facility] to provide your oncology and hematology care.  ? ?If you have a lab appointment with the Cancer Center, please go directly to the Cancer Center and check in at the registration area. ?  ?Wear comfortable clothing and clothing appropriate for easy access to any Portacath or PICC line.  ? ?We strive to give you quality time with your provider. You may need to reschedule your appointment if you arrive late (15 or more minutes).  Arriving late affects you and other patients whose appointments are after yours.  Also, if you miss three or more appointments without notifying the office, you may be dismissed from the clinic at the provider?s discretion.    ?  ?For prescription refill requests, have your pharmacy contact our office and allow 72 hours for refills to be completed.   ? ?Today you received the following chemotherapy and/or immunotherapy agents: oxaliplatin    ?  ?To help prevent nausea and vomiting after your treatment, we encourage you to take your nausea medication as directed. ? ?BELOW ARE SYMPTOMS THAT SHOULD BE REPORTED IMMEDIATELY: ?*FEVER GREATER THAN 100.4 F (38 ?C) OR HIGHER ?*CHILLS OR SWEATING ?*NAUSEA AND VOMITING THAT IS NOT CONTROLLED WITH YOUR NAUSEA MEDICATION ?*UNUSUAL SHORTNESS OF BREATH ?*UNUSUAL BRUISING OR BLEEDING ?*URINARY PROBLEMS (pain or burning when urinating, or frequent urination) ?*BOWEL PROBLEMS (unusual diarrhea, constipation, pain near the anus) ?TENDERNESS IN MOUTH AND THROAT WITH OR WITHOUT PRESENCE OF ULCERS (sore throat, sores in mouth, or a toothache) ?UNUSUAL RASH, SWELLING OR PAIN  ?UNUSUAL VAGINAL DISCHARGE OR ITCHING  ? ?Items with * indicate a potential emergency and should be followed up as soon as possible or go to the Emergency Department if any problems should occur. ? ?Please show the CHEMOTHERAPY ALERT CARD or IMMUNOTHERAPY ALERT CARD at check-in  to the Emergency Department and triage nurse. ? ?Should you have questions after your visit or need to cancel or reschedule your appointment, please contact [facility]  Dept: [phone]  and follow the prompts.  Office hours are 8:00 a.m. to 4:30 p.m. Monday - Friday. Please note that voicemails left after 4:00 p.m. may not be returned until the following business day.  We are closed weekends and major holidays. You have access to a nurse at all times for urgent questions. Please call the main number to the clinic Dept: [phone] and follow the prompts. ? ? ?For any non-urgent questions, you may also contact your provider using MyChart. We now offer e-Visits for anyone 18 and older to request care online for non-urgent symptoms. For details visit [email or web address]. ?  ?Also download the MyChart app! Go to the app store, search "MyChart", open the app, select [facility], and log in with your MyChart username and password. ? ?Due to Covid, a mask is required upon entering the hospital/clinic. If you do not have a mask, one will be given to you upon arrival. For doctor visits, patients may have 1 support person aged 18 or older with them. For treatment visits, patients cannot have anyone with them due to current Covid guidelines and our immunocompromised population.  ? ?

## 2021-07-20 NOTE — Progress Notes (Signed)
[place]   Telephone:(336) [phone] Fax:([phone]) [phone]   Clinic Follow up Note   Patient Care Team: Derinda Late, MD as PCP - General (Family Medicine) [name], MD as Consulting Physician (Gastroenterology) [name], MD as Consulting Physician (Hematology) [name], MD as Consulting Physician (Radiation Oncology) [name], RN as Oncology Nurse Navigator (Oncology)  Date of Service:  07/20/2021  CHIEF COMPLAINT: f/u of rectal cancer  CURRENT THERAPY:  Neoadjuvant CAPOX, with Xeloda 1567m AM/20022mPM 2 weeks on/1 week off, starting 05/10/21, dose reduced due to AEs   ASSESSMENT & PLAN:  Jerry Allison a [age].o. male with   1. 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 He was referred to Dr. CuCandis Schatznd proceeded to colonoscopy on 03/07/21 showing a partially-obstructing tumor in proximal rectum. Biopsy confirmed adenocarcinoma and a tubular adenoma polyp. -baseline CEA that day was normal at 1.7. -staging CT CAP 03/09/21 was negative for adenopathy or metastatic disease. -pelvic MRI 03/27/21 was also negative for nodal metastasis but was not able to give T stage  -he underwent EUS on 04/27/21 with Dr. JaArdis Hughshowing: 4 cm rectal adenocarcinoma invading into and through the muscularis propria layer of rectal wall; 1.5 cm satellite nodule located 5-8 mm distal to tumor. This is staged as T3, node evaluation was not possible due to partial obstruction  -after much consideration, he has opted to proceed with total neoadjuvant therapy  -he began CAPOX on 05/10/21. He tolerated first cycle poorly with a lot of vomiting and fatigue. He tolerated cycle 2 better with Emend and reduced oxali dose. -we held cycle 3 due to worsening transaminitis. Abdomen USKorean 06/29/21 was negative for metastatic disease, showed only mild steatosis. -He is clinically doing very well,  asymptomatic -labs reviewed, CMP shows continuing improvement to transaminitis-- AST 44, ALT 84.  Adequate to proceed chemo treatment with further dose reduction.   2. Chemo Side Effects: Nausea with vomiting, fatigue, weight loss, dysphagia -he experienced vomiting with long periods of standing (such as for a shower) after cycle 2; this is improved from every 2 hours after cycle 1. -he reports difficulty eating, in part due to the nausea and in part from taste change.    3. Symptom Management: epigastric pain, constipation, hematochezia -secondary to tumor -he developed some recurrent abdominal pain lately. I advised him to take miralax.  -hematochezia has resolved since starting treatment.   4. Iron deficient anemia -work up on 03/29/21 showed: iron 23, ferritin 4, hgb 11.1.  This is secondary to rectal bleeding from the tumor. -he received IV iron on 05/15/21. He reports he had terrible constipation following the infusion. -I recommend he take oral iron or prenatal vitamin.     PLAN:  -Lab reviewed, transaminitis improved, will proceed to cycle 3 Cape ox today with further dose reduction, oxaliplatin 100 mg/m, capecitabine 150085m12h on day 1-14  -Lab, follow-up and cycle 4 Capox in 3 weeks -will inform rad/onc to postpone his appointments with him   No problem-specific Assessment & Plan notes found for this encounter.   SUMMARY OF ONCOLOGIC HISTORY: Oncology History  [facility] Procedure   Colonoscopy, Dr. CunCandis Schatzmpression - Malignant appearing partially obstructing tumor in the proximal rectum. Biopsied. Tattooed. - One 3 mm polyp in the distal rectum, removed with a cold snare. Resected and retrieved. - The examined portion of the ileum was normal. - The  distal rectum and anal verge are normal on retroflexion view.   03/07/2021 Pathology Results   Diagnosis 1. Rectum, biopsy, mass - ADENOCARCINOMA - SEE COMMENT 2. Rectum,  biopsy, polyp (1) - TUBULAR ADENOMA (1 OF 1 FRAGMENTS) - NO HIGH-GRADE DYSPLASIA OR MALIGNANCY IDENTIFIED   03/09/2021 Imaging   CT CAP  IMPRESSION: 1. Asymmetric wall thickening in the proximal/mid rectum may correspond to mass seen on endoscopy. 2. Negative for adenopathy or evidence of distal metastatic disease.   03/27/2021 Imaging   MRI Pelvis  IMPRESSION: 1. Low sigmoid primary, greater than 15 cm from the anal verge, as detailed above. Poorly delineated secondary to underdistention in this region. 2. No evidence of pelvic nodal metastasis. 3. Trace perisigmoid fluid, new since the prior CT.   03/28/2021 Initial Diagnosis   Adenocarcinoma of rectum ([place])   03/29/2021 Cancer Staging   Staging form: Colon and Rectum - Neuroendocine Tumors, AJCC 8th Edition - Clinical stage from 03/29/2021: Stage IIB (cT3, cN0, cM0) - Signed by [name], MD on 06/20/2021 Stage prefix: Initial diagnosis Histologic grade (G): G2 Histologic grading system: 3 grade system    04/12/2021 Genetic Testing   Negative genetic testing on the CancerNext-Expanded+RNAinsight panel.  The report date is 04/12/2021.  The CancerNext-Expanded gene panel offered by [facility] and includes sequencing and rearrangement analysis for the following 77 genes: AIP, ALK, APC*, ATM*, AXIN2, BAP1, BARD1, BLM, BMPR1A, BRCA1*, BRCA2*, BRIP1*, CDC73, CDH1*, CDK4, CDKN1B, CDKN2A, CHEK2*, CTNNA1, DICER1, FANCC, FH, FLCN, GALNT12, KIF1B, LZTR1, MAX, MEN1, MET, MLH1*, MSH2*, MSH3, MSH6*, MUTYH*, NBN, NF1*, NF2, NTHL1, PALB2*, PHOX2B, PMS2*, POT1, PRKAR1A, PTCH1, PTEN*, RAD51C*, RAD51D*, RB1, RECQL, RET, SDHA, SDHAF2, SDHB, SDHC, SDHD, SMAD4, SMARCA4, SMARCB1, SMARCE1, STK11, SUFU, TMEM127, TP53*, TSC1, TSC2, VHL and XRCC2 (sequencing and deletion/duplication); EGFR, EGLN1, HOXB13, KIT, MITF, PDGFRA, POLD1, and POLE (sequencing only); EPCAM and GREM1 (deletion/duplication only). DNA and RNA analyses performed for * genes.     04/27/2021 Procedure   EUS, Dr. Ardis Hughs  Impression: - 4cm long, circumferential, at least uT3Nx rectal adenocarcinoma with distal edge located 10cm from the anal verge. 1.5cm satellite nodule located 5-29m distal to the tumor (around 9cm from the anal verge).   05/10/2021 -  Chemotherapy   Patient is on Treatment Plan : RECTAL Xelox (Capeox) q21d x 6 cycles        INTERVAL HISTORY:  JLelon Ikardis here for a follow up of rectal cancer. He was last seen by me on 07/06/21. He presents to the clinic alone. He denies any further rectal bleeding. He does report some recurrent abdominal pain.   All other systems were reviewed with the patient and are negative.  MEDICAL HISTORY:  Past Medical History:  Diagnosis Date   Anxiety    Family history of bladder cancer    [place]    SURGICAL HISTORY: Past Surgical History:  Procedure Laterality Date   EUS N/A 04/27/2021   Procedure: LOWER ENDOSCOPIC ULTRASOUND (EUS);  Surgeon: JMilus Banister MD;  [facility];  Service: Endoscopy;  Laterality: N/A;   FLEXIBLE SIGMOIDOSCOPY N/A 04/27/2021   Procedure: FLEXIBLE SIGMOIDOSCOPY;  Surgeon: JMilus Banister MD;  [facility];  Service: Endoscopy;  Laterality: N/A;   NO PAST SURGERIES      I have reviewed the social history and family history with the patient and they are unchanged from previous note.  ALLERGIES:  is allergic to shellfish allergy.  MEDICATIONS:  Current Outpatient Medications  Medication Sig Dispense Refill  capecitabine (XELODA) 500 MG tablet Take 3 tabs in morning and 4 tabs in evening every 12 hours. Take after meals. 14 days on and 7 days off 98 tablet 2   dicyclomine (BENTYL) 20 MG tablet Take 1 tablet (20 mg total) by mouth 3 (three) times daily as needed for spasms. 60 tablet 1   escitalopram (LEXAPRO) 10 MG tablet Take 5 mg by mouth daily.     ondansetron (ZOFRAN) 8 MG tablet Take 1 tablet (8 mg total) by mouth 2 (two)  times daily as needed for refractory nausea / vomiting. Start on day 3 after chemotherapy. 30 tablet 1   prochlorperazine (COMPAZINE) 10 MG tablet Take 1 tablet (10 mg total) by mouth every 6 (six) hours as needed (Nausea or vomiting). 30 tablet 1   No current facility-administered medications for this visit.   Facility-Administered Medications Ordered in Other Visits  Medication Dose Route Frequency Provider Last Rate Last Admin   heparin lock flush 100 unit/mL  500 Units Intracatheter Once PRN [name], MD       sodium chloride flush (NS) 0.9 % injection 10 mL  10 mL Intracatheter PRN [name], MD        PHYSICAL EXAMINATION: ECOG PERFORMANCE STATUS: 0 - Asymptomatic  Vitals:   07/20/21 0924  BP: 124/86  Pulse: 83  Resp: 16  Temp: 98.6 F (37 C)  SpO2: 99%   Wt Readings from Last 3 Encounters:  07/20/21 184 lb 8 oz (83.7 kg)  07/06/21 180 lb 14.4 oz (82.1 kg)  06/21/21 179 lb 6.4 oz (81.4 kg)     GENERAL:alert, no distress and comfortable SKIN: skin color, texture, turgor are normal, no rashes or significant lesions EYES: normal, Conjunctiva are pink and non-injected, sclera clear  HEART: no lower extremity edema ABDOMEN:abdomen soft, non-tender and normal bowel sounds Musculoskeletal:no cyanosis of digits and no clubbing  NEURO: alert & oriented x 3 with fluent speech, no focal motor/sensory deficits  LABORATORY DATA:  I have reviewed the data as listed CBC Latest Ref Rng & Units 07/20/2021 07/06/2021 06/28/2021  WBC 4.0 - 10.5 K/uL 5.2 4.2 3.9(L)  Hemoglobin 13.0 - 17.0 g/dL 12.4(L) 12.9(L) 12.0(L)  Hematocrit 39.0 - 52.0 % 38.7(L) 40.5 37.6(L)  Platelets 150 - 400 K/uL 297 281 258     CMP Latest Ref Rng & Units 07/20/2021 07/06/2021 06/28/2021  Glucose 70 - 99 mg/dL 160(H) 97 111(H)  BUN 6 - 20 mg/dL _0 Creatinine 0.61 - 1.24 mg/dL 1.08 1.08 1.03  Sodium 135 - 145 mmol/L 138 140 140  Potassium 3.5 - 5.1 mmol/L 3.8 4.1 3.9  Chloride 98 - 111 mmol/L 107 108 107   CO2 22 - 32 mmol/L _1 Calcium 8.9 - 10.3 mg/dL 9.2 9.7 9.4  Total Protein 6.5 - 8.1 g/dL 6.8 7.2 6.9  Total Bilirubin 0.3 - 1.2 mg/dL 0.4 0.4 0.4  Alkaline Phos 38 - 126 U/L 60 82 84  AST 15 - 41 U/L 44(H) 117(H) 255(HH)  ALT 0 - 44 U/L 84(H) 332(HH) 499(HH)      RADIOGRAPHIC STUDIES: I have personally reviewed the radiological images as listed and agreed with the findings in the report. No results found.    No orders of the defined types were placed in this encounter.  All questions were answered. The patient knows to call the clinic with any problems, questions or concerns. No barriers to learning was detected. The total time spent in the appointment was 88  minutes.     [name], MD 07/20/2021   I, Wilburn Mylar, am acting as scribe for [name], MD.   I have reviewed the above documentation for accuracy and completeness, and I agree with the above.

## 2021-07-24 ENCOUNTER — Telehealth: Payer: Self-pay | Admitting: Hematology

## 2021-07-24 NOTE — Telephone Encounter (Signed)
Scheduled follow-up appointment per 2/23 los. Patient is aware. Mailed calendar.

## 2021-08-08 ENCOUNTER — Other Ambulatory Visit: Payer: Self-pay | Admitting: *Deleted

## 2021-08-08 DIAGNOSIS — R7989 Other specified abnormal findings of blood chemistry: Secondary | ICD-10-CM

## 2021-08-08 DIAGNOSIS — D500 Iron deficiency anemia secondary to blood loss (chronic): Secondary | ICD-10-CM

## 2021-08-08 DIAGNOSIS — C20 Malignant neoplasm of rectum: Secondary | ICD-10-CM

## 2021-08-08 DIAGNOSIS — D630 Anemia in neoplastic disease: Secondary | ICD-10-CM

## 2021-08-09 ENCOUNTER — Other Ambulatory Visit: Payer: Self-pay

## 2021-08-09 ENCOUNTER — Inpatient Hospital Stay: Payer: BC Managed Care – PPO

## 2021-08-09 ENCOUNTER — Inpatient Hospital Stay (HOSPITAL_BASED_OUTPATIENT_CLINIC_OR_DEPARTMENT_OTHER): Payer: BC Managed Care – PPO | Admitting: Hematology

## 2021-08-09 ENCOUNTER — Encounter: Payer: Self-pay | Admitting: Hematology

## 2021-08-09 ENCOUNTER — Inpatient Hospital Stay: Payer: BC Managed Care – PPO | Attending: Hematology

## 2021-08-09 VITALS — BP 140/95 | HR 78 | Temp 98.1°F | Resp 18 | Ht 72.0 in | Wt 188.1 lb

## 2021-08-09 DIAGNOSIS — Z5111 Encounter for antineoplastic chemotherapy: Secondary | ICD-10-CM | POA: Diagnosis not present

## 2021-08-09 DIAGNOSIS — D630 Anemia in neoplastic disease: Secondary | ICD-10-CM

## 2021-08-09 DIAGNOSIS — C20 Malignant neoplasm of rectum: Secondary | ICD-10-CM | POA: Diagnosis not present

## 2021-08-09 DIAGNOSIS — D500 Iron deficiency anemia secondary to blood loss (chronic): Secondary | ICD-10-CM

## 2021-08-09 LAB — CBC WITH DIFFERENTIAL (CANCER CENTER ONLY)
Abs Immature Granulocytes: 0.01 10*3/uL (ref 0.00–0.07)
Basophils Absolute: 0 10*3/uL (ref 0.0–0.1)
Basophils Relative: 1 %
Eosinophils Absolute: 0.4 10*3/uL (ref 0.0–0.5)
Eosinophils Relative: 10 %
HCT: 40 % (ref 39.0–52.0)
Hemoglobin: 13 g/dL (ref 13.0–17.0)
Immature Granulocytes: 0 %
Lymphocytes Relative: 36 %
Lymphs Abs: 1.3 10*3/uL (ref 0.7–4.0)
MCH: 27 pg (ref 26.0–34.0)
MCHC: 32.5 g/dL (ref 30.0–36.0)
MCV: 83.2 fL (ref 80.0–100.0)
Monocytes Absolute: 0.6 10*3/uL (ref 0.1–1.0)
Monocytes Relative: 18 %
Neutro Abs: 1.3 10*3/uL — ABNORMAL LOW (ref 1.7–7.7)
Neutrophils Relative %: 35 %
Platelet Count: 226 10*3/uL (ref 150–400)
RBC: 4.81 MIL/uL (ref 4.22–5.81)
RDW: 24.1 % — ABNORMAL HIGH (ref 11.5–15.5)
WBC Count: 3.6 10*3/uL — ABNORMAL LOW (ref 4.0–10.5)
nRBC: 0 % (ref 0.0–0.2)

## 2021-08-09 LAB — IRON AND IRON BINDING CAPACITY (CC-WL,HP ONLY)
Iron: 50 ug/dL (ref 45–182)
Saturation Ratios: 11 % — ABNORMAL LOW (ref 17.9–39.5)
TIBC: 440 ug/dL (ref 250–450)
UIBC: 390 ug/dL — ABNORMAL HIGH (ref 117–376)

## 2021-08-09 LAB — CMP (CANCER CENTER ONLY)
ALT: 65 U/L — ABNORMAL HIGH (ref 0–44)
AST: 38 U/L (ref 15–41)
Albumin: 4.2 g/dL (ref 3.5–5.0)
Alkaline Phosphatase: 52 U/L (ref 38–126)
Anion gap: 7 (ref 5–15)
BUN: 10 mg/dL (ref 6–20)
CO2: 25 mmol/L (ref 22–32)
Calcium: 9.4 mg/dL (ref 8.9–10.3)
Chloride: 108 mmol/L (ref 98–111)
Creatinine: 1.05 mg/dL (ref 0.61–1.24)
GFR, Estimated: >60 mL/min (ref >60)
Glucose, Bld: 106 mg/dL — ABNORMAL HIGH (ref 70–99)
Potassium: 4 mmol/L (ref 3.5–5.1)
Sodium: 140 mmol/L (ref 135–145)
Total Bilirubin: 0.5 mg/dL (ref 0.3–1.2)
Total Protein: 7 g/dL (ref 6.5–8.1)

## 2021-08-09 LAB — FERRITIN: Ferritin: 31 ng/mL (ref 24–336)

## 2021-08-09 MED ORDER — DEXTROSE 5 % IV SOLN: Freq: Once | INTRAVENOUS | Status: AC

## 2021-08-09 MED ORDER — PALONOSETRON HCL INJECTION 0.25 MG/5ML
0.2500 mg | Freq: Once | INTRAVENOUS | Status: AC
  Administered 2021-08-09: 0.25 mg via INTRAVENOUS
  Filled 2021-08-09: qty 5

## 2021-08-09 MED ORDER — SODIUM CHLORIDE 0.9 % IV SOLN
10.0000 mg | Freq: Once | INTRAVENOUS | Status: AC
  Administered 2021-08-09: 10 mg via INTRAVENOUS
  Filled 2021-08-09: qty 10

## 2021-08-09 MED ORDER — OXALIPLATIN CHEMO INJECTION 100 MG/20ML
100.0000 mg/m2 | Freq: Once | INTRAVENOUS | Status: AC
  Administered 2021-08-09: 205 mg via INTRAVENOUS
  Filled 2021-08-09: qty 41

## 2021-08-09 MED ORDER — SODIUM CHLORIDE 0.9 % IV SOLN
150.0000 mg | Freq: Once | INTRAVENOUS | Status: AC
  Administered 2021-08-09: 150 mg via INTRAVENOUS
  Filled 2021-08-09: qty 150

## 2021-08-09 MED ORDER — CAPECITABINE 500 MG PO TABS: ORAL_TABLET | ORAL | 2 refills | Status: DC

## 2021-08-09 NOTE — Progress Notes (Signed)
OK to treat today w/ D1C4 oxaliplatin per Dr. Burr Medico ANC 1.3 ?

## 2021-08-09 NOTE — Progress Notes (Signed)
?[place]   ?Telephone:(336) [phone] [phone]   ?Clinic Follow up Note  ? ?Patient Care Team: ?Derinda Late, MD as PCP - General (Family Medicine) ?[name], MD as Consulting Physician (Gastroenterology) ?[name], MD as Consulting Physician (Hematology) ?Nadeen Landau ?Kyung Rudd, MD as Consulting Physician (Radiation Oncology) ?Earl Gala, Deliah Goody, RN as Sales executive (Oncology) ? ?Date of Service:  08/09/2021 ? ?CHIEF COMPLAINT: f/u of rectal cancer ? ?CURRENT THERAPY:  ?Neoadjuvant CAPOX, with Xeloda 1520m AM/20067mPM 2 weeks on/1 week off, starting 05/10/21, dose reduced due to AEs  ? ?ASSESSMENT & PLAN:  ?[name] a [age].o. male with  ? ?1. 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 He was referred to Dr. CuCandis Schatznd proceeded to colonoscopy on 03/07/21 showing a partially-obstructing tumor in proximal rectum. Biopsy confirmed adenocarcinoma and a tubular adenoma polyp. ?-baseline CEA that day was normal at 1.7. ?-staging CT CAP 03/09/21 was negative for adenopathy or metastatic disease. ?-pelvic MRI 03/27/21 was also negative for nodal metastasis but was not able to give T stage  ?-he underwent EUS on 04/27/21 with Dr. JaArdis Hughshowing: 4 cm rectal adenocarcinoma invading into and through the muscularis propria layer of rectal wall; 1.5 cm satellite nodule located 5-8 mm distal to tumor. This is staged as T3, node evaluation was not possible due to partial obstruction  ?-after much consideration, he has opted to proceed with total neoadjuvant therapy  ?-he began CAPOX on 05/10/21. He tolerated first cycle poorly with a lot of vomiting and fatigue. He tolerated cycle 2 better with Emend and reduced oxali dose. ?-we held cycle 3 due to worsening transaminitis. Abdomen USKorean 06/29/21 was negative for metastatic disease, showed only mild steatosis. ?-labs reviewed, CMP shows continued  improvement in liver enzymes-- AST 38 (WNL) and ALT 65. Adequate to proceed with treatment at same reduced dose of oxaliplatin at 10029m2, will reduce Xeloda to 1500m36m2h for 14 days due to his neutropenia. ?-will move on to chemoRT after two more cycle CAPOX  ?  ?2. Chemo Side Effects: Nausea with vomiting, fatigue, weight loss, dysphagia ?-he experienced vomiting with long periods of standing (such as for a shower) after cycle 2; this is improved from every 2 hours after cycle 1. ?-he reports difficulty eating, in part due to the nausea and in part from taste change.  ?-he notes improvement with dose reduction and use of marijuana. ?  ?3. Symptom Management: epigastric pain, constipation, hematochezia ?-secondary to tumor ?-he developed some recurrent abdominal pain lately. I advised him to take miralax.  ?-hematochezia has resolved since starting treatment. ?  ?4. Iron deficient anemia ?-work up on 03/29/21 showed: iron 23, ferritin 4, hgb 11.1.  This is secondary to rectal bleeding from the tumor. ?-he received IV iron on 05/15/21. He reports he had terrible constipation following the infusion. ?-I recommend he take oral iron or prenatal vitamin. ?  ?  ?PLAN:  ?-proceed with C4 oxali at same reduced dose ?-start capecitabine 1500mg25mh on day 1-14 today, I refilled today  ?-Lab, f/u, and oxali in 3 and 6 weeks ?-appointment with rad onc has been moved to 10/03/21 ? ? ?No problem-specific Assessment & Plan notes found for this encounter. ? ? ?SUMMARY OF ONCOLOGIC HISTORY: ?Oncology History  ?Adenocarcinoma of rectum ([facility]) [place]0/03/2021 Procedure  ? Colonoscopy, Dr. CunniCandis Schatzmpression ?- Malignant appearing partially obstructing tumor in the proximal rectum. Biopsied. Tattooed. ?-  One 3 mm polyp in the distal rectum, removed with a cold snare. Resected and retrieved. ?- The examined portion of the ileum was normal. ?- The distal rectum and anal verge are normal on retroflexion view. ?  ?03/07/2021 Pathology  Results  ? Diagnosis ?1. Rectum, biopsy, mass ?- ADENOCARCINOMA ?- SEE COMMENT ?2. Rectum, biopsy, polyp (1) ?- TUBULAR ADENOMA (1 OF 1 FRAGMENTS) ?- NO HIGH-GRADE DYSPLASIA OR MALIGNANCY IDENTIFIED ?  ?03/09/2021 Imaging  ? CT CAP ? ?IMPRESSION: ?1. Asymmetric wall thickening in the proximal/mid rectum may ?correspond to mass seen on endoscopy. ?2. Negative for adenopathy or evidence of distal metastatic disease. ?  ?03/27/2021 Imaging  ? MRI Pelvis ? ?IMPRESSION: ?1. Low sigmoid primary, greater than 15 cm from the anal verge, as ?detailed above. Poorly delineated secondary to underdistention in ?this region. ?2. No evidence of pelvic nodal metastasis. ?3. Trace perisigmoid fluid, new since the prior CT. ?  ?03/28/2021 Initial Diagnosis  ? Adenocarcinoma of rectum ([place]) ?  ?03/29/2021 Cancer Staging  ? Staging form: Colon and Rectum - Neuroendocine Tumors, AJCC 8th Edition ?- Clinical stage from 03/29/2021: Stage IIB (cT3, cN0, cM0) - Signed by [name], MD on 06/20/2021 ?Stage prefix: Initial diagnosis ?Histologic grade (G): G2 ?Histologic grading system: 3 grade system ? ?  ?04/12/2021 Genetic Testing  ? Negative genetic testing on the CancerNext-Expanded+RNAinsight panel.  The report date is 04/12/2021. ? ?The CancerNext-Expanded gene panel offered by [facility] and includes sequencing and rearrangement analysis for the following 77 genes: AIP, ALK, APC*, ATM*, AXIN2, BAP1, BARD1, BLM, BMPR1A, BRCA1*, BRCA2*, BRIP1*, CDC73, CDH1*, CDK4, CDKN1B, CDKN2A, CHEK2*, CTNNA1, DICER1, FANCC, FH, FLCN, GALNT12, KIF1B, LZTR1, MAX, MEN1, MET, MLH1*, MSH2*, MSH3, MSH6*, MUTYH*, NBN, NF1*, NF2, NTHL1, PALB2*, PHOX2B, PMS2*, POT1, PRKAR1A, PTCH1, PTEN*, RAD51C*, RAD51D*, RB1, RECQL, RET, SDHA, SDHAF2, SDHB, SDHC, SDHD, SMAD4, SMARCA4, SMARCB1, SMARCE1, STK11, SUFU, TMEM127, TP53*, TSC1, TSC2, VHL and XRCC2 (sequencing and deletion/duplication); EGFR, EGLN1, HOXB13, KIT, MITF, PDGFRA, POLD1, and POLE (sequencing only); EPCAM  and GREM1 (deletion/duplication only). DNA and RNA analyses performed for * genes.  ?  ?04/27/2021 Procedure  ? EUS, Dr. Ardis Hughs ? ?Impression: ?- 4cm long, circumferential, at least uT3Nx rectal adenocarcinoma with distal edge located 10cm from the anal verge. 1.5cm satellite nodule located 5-16m distal to the tumor (around 9cm from the anal verge). ?  ?05/10/2021 -  Chemotherapy  ? Patient is on Treatment Plan : RECTAL Xelox (Capeox) q21d x 6 cycles  ?   ? ? ? ?INTERVAL HISTORY:  ?JSahir Tolsonis here for a follow up of rectal cancer. He was last seen by me on 07/20/21. He presents to the clinic alone. ?He reports he felt better this last cycle. He does note he also smoked some marijuana, which seemed to help. He is unsure if that or the lower dose attributed to feeling better. ?  ?All other systems were reviewed with the patient and are negative. ? ?MEDICAL HISTORY:  ?Past Medical History:  ?Diagnosis Date  ? Anxiety   ? Family history of bladder cancer   ? [place]  ? ? ?SURGICAL HISTORY: ?Past Surgical History:  ?Procedure Laterality Date  ? EUS N/A 04/27/2021  ? Procedure: LOWER ENDOSCOPIC ULTRASOUND (EUS);  Surgeon: JMilus Banister MD;  [facility];  Service: Endoscopy;  Laterality: N/A;  ? FLEXIBLE SIGMOIDOSCOPY N/A 04/27/2021  ? Procedure: FLEXIBLE SIGMOIDOSCOPY;  Surgeon: JMilus Banister MD;  [facility];  Service: Endoscopy;  Laterality: N/A;  ?  NO PAST SURGERIES    ? ? ?I have reviewed the social history and family history with the patient and they are unchanged from previous note. ? ?ALLERGIES:  is allergic to shellfish allergy. ? ?MEDICATIONS:  ?Current Outpatient Medications  ?Medication Sig Dispense Refill  ? capecitabine (XELODA) 500 MG tablet Take 3 tabs every 12 hours. Take after meals. 14 days on and 7 days off 84 tablet 2  ? dicyclomine (BENTYL) 20 MG tablet Take 1 tablet (20 mg total) by mouth 3 (three) times daily as needed for spasms. 60 tablet 1  ?  escitalopram (LEXAPRO) 10 MG tablet Take 5 mg by mouth daily.    ? ondansetron (ZOFRAN) 8 MG tablet Take 1 tablet (8 mg total) by mouth 2 (two) times daily as needed for refractory nausea / vomiting.

## 2021-08-10 ENCOUNTER — Ambulatory Visit: Payer: BC Managed Care – PPO | Admitting: Hematology

## 2021-08-10 ENCOUNTER — Other Ambulatory Visit: Payer: BC Managed Care – PPO

## 2021-08-10 ENCOUNTER — Ambulatory Visit: Payer: BC Managed Care – PPO

## 2021-08-15 ENCOUNTER — Ambulatory Visit: Payer: BC Managed Care – PPO

## 2021-08-15 ENCOUNTER — Ambulatory Visit: Payer: BC Managed Care – PPO | Admitting: Radiation Oncology

## 2021-08-31 ENCOUNTER — Inpatient Hospital Stay: Payer: BC Managed Care – PPO

## 2021-08-31 ENCOUNTER — Inpatient Hospital Stay (HOSPITAL_BASED_OUTPATIENT_CLINIC_OR_DEPARTMENT_OTHER): Payer: BC Managed Care – PPO | Admitting: Hematology

## 2021-08-31 ENCOUNTER — Other Ambulatory Visit: Payer: Self-pay

## 2021-08-31 ENCOUNTER — Encounter: Payer: Self-pay | Admitting: Hematology

## 2021-08-31 ENCOUNTER — Ambulatory Visit: Payer: BC Managed Care – PPO

## 2021-08-31 ENCOUNTER — Other Ambulatory Visit: Payer: BC Managed Care – PPO

## 2021-08-31 ENCOUNTER — Inpatient Hospital Stay: Payer: BC Managed Care – PPO | Attending: Hematology

## 2021-08-31 ENCOUNTER — Ambulatory Visit: Payer: BC Managed Care – PPO | Admitting: Hematology

## 2021-08-31 VITALS — BP 146/95 | HR 92 | Temp 98.7°F | Resp 18 | Ht 72.0 in | Wt 186.1 lb

## 2021-08-31 DIAGNOSIS — D500 Iron deficiency anemia secondary to blood loss (chronic): Secondary | ICD-10-CM

## 2021-08-31 DIAGNOSIS — C20 Malignant neoplasm of rectum: Secondary | ICD-10-CM

## 2021-08-31 DIAGNOSIS — Z5111 Encounter for antineoplastic chemotherapy: Secondary | ICD-10-CM | POA: Diagnosis present

## 2021-08-31 LAB — CMP (CANCER CENTER ONLY)
ALT: 91 U/L — ABNORMAL HIGH (ref 0–44)
AST: 50 U/L — ABNORMAL HIGH (ref 15–41)
Albumin: 4 g/dL (ref 3.5–5.0)
Alkaline Phosphatase: 57 U/L (ref 38–126)
Anion gap: 5 (ref 5–15)
BUN: 11 mg/dL (ref 6–20)
CO2: 26 mmol/L (ref 22–32)
Calcium: 9.1 mg/dL (ref 8.9–10.3)
Chloride: 108 mmol/L (ref 98–111)
Creatinine: 0.99 mg/dL (ref 0.61–1.24)
GFR, Estimated: >60 mL/min (ref >60)
Glucose, Bld: 132 mg/dL — ABNORMAL HIGH (ref 70–99)
Potassium: 3.8 mmol/L (ref 3.5–5.1)
Sodium: 139 mmol/L (ref 135–145)
Total Bilirubin: 0.7 mg/dL (ref 0.3–1.2)
Total Protein: 6.7 g/dL (ref 6.5–8.1)

## 2021-08-31 LAB — FERRITIN: Ferritin: 50 ng/mL (ref 24–336)

## 2021-08-31 LAB — CBC WITH DIFFERENTIAL (CANCER CENTER ONLY)
Abs Immature Granulocytes: 0.01 10*3/uL (ref 0.00–0.07)
Basophils Absolute: 0 10*3/uL (ref 0.0–0.1)
Basophils Relative: 1 %
Eosinophils Absolute: 0.3 10*3/uL (ref 0.0–0.5)
Eosinophils Relative: 8 %
HCT: 38.7 % — ABNORMAL LOW (ref 39.0–52.0)
Hemoglobin: 12.7 g/dL — ABNORMAL LOW (ref 13.0–17.0)
Immature Granulocytes: 0 %
Lymphocytes Relative: 40 %
Lymphs Abs: 1.7 10*3/uL (ref 0.7–4.0)
MCH: 28.2 pg (ref 26.0–34.0)
MCHC: 32.8 g/dL (ref 30.0–36.0)
MCV: 85.8 fL (ref 80.0–100.0)
Monocytes Absolute: 0.7 10*3/uL (ref 0.1–1.0)
Monocytes Relative: 16 %
Neutro Abs: 1.5 10*3/uL — ABNORMAL LOW (ref 1.7–7.7)
Neutrophils Relative %: 35 %
Platelet Count: 238 10*3/uL (ref 150–400)
RBC: 4.51 MIL/uL (ref 4.22–5.81)
RDW: 24.1 % — ABNORMAL HIGH (ref 11.5–15.5)
WBC Count: 4.2 10*3/uL (ref 4.0–10.5)
nRBC: 0 % (ref 0.0–0.2)

## 2021-08-31 MED ORDER — SODIUM CHLORIDE 0.9 % IV SOLN
10.0000 mg | Freq: Once | INTRAVENOUS | Status: AC
  Administered 2021-08-31: 10 mg via INTRAVENOUS
  Filled 2021-08-31: qty 10

## 2021-08-31 MED ORDER — DEXTROSE 5 % IV SOLN: Freq: Once | INTRAVENOUS | Status: AC

## 2021-08-31 MED ORDER — DRONABINOL 2.5 MG PO CAPS: 2.5000 mg | ORAL_CAPSULE | Freq: Two times a day (BID) | ORAL | 0 refills | Status: DC

## 2021-08-31 MED ORDER — SODIUM CHLORIDE 0.9 % IV SOLN
150.0000 mg | Freq: Once | INTRAVENOUS | Status: AC
  Administered 2021-08-31: 150 mg via INTRAVENOUS
  Filled 2021-08-31: qty 150

## 2021-08-31 MED ORDER — PALONOSETRON HCL INJECTION 0.25 MG/5ML
0.2500 mg | Freq: Once | INTRAVENOUS | Status: AC
  Administered 2021-08-31: 0.25 mg via INTRAVENOUS
  Filled 2021-08-31: qty 5

## 2021-08-31 MED ORDER — OXALIPLATIN CHEMO INJECTION 100 MG/20ML
100.0000 mg/m2 | Freq: Once | INTRAVENOUS | Status: AC
  Administered 2021-08-31: 205 mg via INTRAVENOUS
  Filled 2021-08-31: qty 41

## 2021-08-31 NOTE — Progress Notes (Signed)
?[place]   ?[phone] [phone]   ?Clinic Follow up Note  ? ?Patient Care Team: ?Derinda Late, MD as PCP - General (Family Medicine) ?[name], MD as Consulting Physician (Gastroenterology) ?[name], MD as Consulting Physician (Hematology) ?Nadeen Landau ?Kyung Rudd, MD as Consulting Physician (Radiation Oncology) ?Earl Gala, Deliah Goody, RN as Sales executive (Oncology) ? ?Date of Service:  08/31/2021 ? ?CHIEF COMPLAINT: f/u of rectal cancer ? ?CURRENT THERAPY:  ?Neoadjuvant CAPOX, with Xeloda 1549m AM/20073mPM 2 weeks on/1 week off, starting 05/10/21, dose reduced due to AEs  ? ?ASSESSMENT & PLAN:  ?Jerry Skillmans a 89.o. male with  ? ?1. 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 He was referred to Dr. CuCandis Schatznd proceeded to colonoscopy on 03/07/21 showing a partially-obstructing tumor in proximal rectum. Biopsy confirmed adenocarcinoma and a tubular adenoma polyp. ?-baseline CEA that day was normal at 1.7. ?-staging CT CAP 03/09/21 was negative for adenopathy or metastatic disease. ?-pelvic MRI 03/27/21 was also negative for nodal metastasis but was not able to give T stage  ?-he underwent EUS on 04/27/21 with Dr. JaArdis Hughshowing: 4 cm rectal adenocarcinoma invading into and through the muscularis propria layer of rectal wall; 1.5 cm satellite nodule located 5-8 mm distal to tumor. This is staged as T3, node evaluation was not possible due to partial obstruction  ?-after much consideration, he has opted to proceed with total neoadjuvant therapy  ?-he began CAPOX on 05/10/21. He tolerated first cycle poorly with a lot of vomiting and fatigue. He tolerated cycle 2 better with Emend and reduced oxali dose. ?-we held cycle 3 due to worsening transaminitis. Abdomen [place] 06/29/21 was negative for metastatic disease, showed only mild steatosis. ?-labs reviewed, CMP shows slightly  worsening transaminitis. Adequate to proceed with treatment at same reduced dose of oxaliplatin at 10025m2, continue Xeloda to 1500m36m2h for 14 days  ?-will move on to chemoRT after one more cycle CAPOX  ?  ?2. Chemo Side Effects: Nausea with vomiting, fatigue, weight loss, dysphagia ?-he experienced vomiting with long periods of standing (such as for a shower) after cycle 2; this is improved from every 2 hours after cycle 1. ?-he reports difficulty eating, in part due to the nausea and in part from taste change. I [place] to try. ?  ?3. Symptom Management: epigastric pain, constipation, hematochezia ?-secondary to tumor ?-he developed some recurrent abdominal pain lately. I advised him to take miralax.  ?-hematochezia has resolved since starting treatment. ?  ?4. Iron deficient anemia ?-work up on 03/29/21 showed: iron 23, ferritin 4, hgb 11.1.  This is secondary to rectal bleeding from the tumor. ?-he received IV iron on 05/15/21. He reports he had terrible constipation following the infusion. ?-I recommend he take oral iron or prenatal vitamin. ?  ?  ?PLAN:  ?-proceed with C5 oxali at same reduced dose ?-start capecitabine 1500mg45mh on day 1-14 today ?-I called in marinol for chemo related anorexia ?-Lab, f/u, and oxali in 3 weeks ?-appointment with rad onc has been moved to 10/03/21 ? ? ?No problem-specific Assessment & Plan notes found for this encounter. ? ? ?SUMMARY OF ONCOLOGIC HISTORY: ?Oncology History  ?Adenocarcinoma of rectum (HCC) [place]0/03/2021 Procedure  ? Colonoscopy, Dr. CunniCandis Schatzmpression ?- Malignant appearing partially obstructing tumor in the proximal rectum. Biopsied. Tattooed. ?- One 3 mm polyp in the distal rectum, removed with a cold  snare. Resected and retrieved. ?- The examined portion of the ileum was normal. ?- The distal rectum and anal verge are normal on retroflexion view. ?  ?03/07/2021 Pathology Results  ? Diagnosis ?1. Rectum, biopsy, mass ?-  ADENOCARCINOMA ?- SEE COMMENT ?2. Rectum, biopsy, polyp (1) ?- TUBULAR ADENOMA (1 OF 1 FRAGMENTS) ?- NO HIGH-GRADE DYSPLASIA OR MALIGNANCY IDENTIFIED ?  ?03/09/2021 Imaging  ? CT CAP ? ?IMPRESSION: ?1. Asymmetric wall thickening in the proximal/mid rectum may ?correspond to mass seen on endoscopy. ?2. Negative for adenopathy or evidence of distal metastatic disease. ?  ?03/27/2021 Imaging  ? MRI Pelvis ? ?IMPRESSION: ?1. Low sigmoid primary, greater than 15 cm from the anal verge, as ?detailed above. Poorly delineated secondary to underdistention in ?this region. ?2. No evidence of pelvic nodal metastasis. ?3. Trace perisigmoid fluid, new since the prior CT. ?  ?03/28/2021 Initial Diagnosis  ? Adenocarcinoma of rectum ([place]) ?  ?03/29/2021 Cancer Staging  ? Staging form: Colon and Rectum - Neuroendocine Tumors, AJCC 8th Edition ?- Clinical stage from 03/29/2021: Stage IIB (cT3, cN0, cM0) - Signed by [name], MD on 06/20/2021 ?Stage prefix: Initial diagnosis ?Histologic grade (G): G2 ?Histologic grading system: 3 grade system ? ?  ?04/12/2021 Genetic Testing  ? Negative genetic testing on the CancerNext-Expanded+RNAinsight panel.  The report date is 04/12/2021. ? ?The CancerNext-Expanded gene panel offered by [facility] and includes sequencing and rearrangement analysis for the following 77 genes: AIP, ALK, APC*, ATM*, AXIN2, BAP1, BARD1, BLM, BMPR1A, BRCA1*, BRCA2*, BRIP1*, CDC73, CDH1*, CDK4, CDKN1B, CDKN2A, CHEK2*, CTNNA1, DICER1, FANCC, FH, FLCN, GALNT12, KIF1B, LZTR1, MAX, MEN1, MET, MLH1*, MSH2*, MSH3, MSH6*, MUTYH*, NBN, NF1*, NF2, NTHL1, PALB2*, PHOX2B, PMS2*, POT1, PRKAR1A, PTCH1, PTEN*, RAD51C*, RAD51D*, RB1, RECQL, RET, SDHA, SDHAF2, SDHB, SDHC, SDHD, SMAD4, SMARCA4, SMARCB1, SMARCE1, STK11, SUFU, TMEM127, TP53*, TSC1, TSC2, VHL and XRCC2 (sequencing and deletion/duplication); EGFR, EGLN1, HOXB13, KIT, MITF, PDGFRA, POLD1, and POLE (sequencing only); EPCAM and GREM1 (deletion/duplication only). DNA and  RNA analyses performed for * genes.  ?  ?04/27/2021 Procedure  ? EUS, Dr. Ardis Hughs ? ?Impression: ?- 4cm long, circumferential, at least uT3Nx rectal adenocarcinoma with distal edge located 10cm from the anal verge. 1.5cm satellite nodule located 5-50m distal to the tumor (around 9cm from the anal verge). ?  ?05/10/2021 -  Chemotherapy  ? Patient is on Treatment Plan : RECTAL Xelox (Capeox) q21d x 6 cycles  ?   ? ? ? ?INTERVAL HISTORY:  ?JRaeshaun Simsonis here for a follow up of rectal cancer. He was last seen by me on 08/09/21. He presents to the clinic alone. ?He reports he did well with last cycle, notes he was able to keep food down. He notes he had some cold sensitivity, which affected his appetite/eating but has resolved. ?  ?All other systems were reviewed with the patient and are negative. ? ?MEDICAL HISTORY:  ?Past Medical History:  ?Diagnosis Date  ? Anxiety   ? Family history of bladder cancer   ? [place]  ? ? ?SURGICAL HISTORY: ?Past Surgical History:  ?Procedure Laterality Date  ? EUS N/A 04/27/2021  ? Procedure: LOWER ENDOSCOPIC ULTRASOUND (EUS);  Surgeon: JMilus Banister MD;  [facility];  Service: Endoscopy;  Laterality: N/A;  ? FLEXIBLE SIGMOIDOSCOPY N/A 04/27/2021  ? Procedure: FLEXIBLE SIGMOIDOSCOPY;  Surgeon: JMilus Banister MD;  [facility];  Service: Endoscopy;  Laterality: N/A;  ? NO PAST SURGERIES    ? ? ?I have reviewed the social history and  family history with the patient and they are unchanged from previous note. ? ?ALLERGIES:  is allergic to shellfish allergy. ? ?MEDICATIONS:  ?Current Outpatient Medications  ?Medication Sig Dispense Refill  ? dronabinol (MARINOL) 2.5 MG capsule Take 1 capsule (2.5 mg total) by mouth 2 (two) times daily before a meal. 30 capsule 0  ? capecitabine (XELODA) 500 MG tablet Take 3 tabs every 12 hours. Take after meals. 14 days on and 7 days off 84 tablet 2  ? dicyclomine (BENTYL) 20 MG tablet Take 1 tablet (20 mg total)  by mouth 3 (three) times daily as needed for spasms. 60 tablet 1  ? escitalopram (LEXAPRO) 10 MG tablet Take 5 mg by mouth daily.    ? ondansetron (ZOFRAN) 8 MG tablet Take 1 tablet (8 mg total) by mouth 2 (two) t

## 2021-08-31 NOTE — Patient Instructions (Signed)
[facility]ICAL [facility]COLOG[facility]   ?Discharge Instructions: ?Thank you for choosing [facility] to provide your oncology and hematology care.  ? ?If you have a lab appointment with the Cancer Center, please go directly to the Cancer Center and check in at the registration area. ?  ?Wear comfortable clothing and clothing appropriate for easy access to any Portacath or PICC line.  ? ?We strive to give you quality time with your provider. You may need to reschedule your appointment if you arrive late (15 or more minutes).  Arriving late affects you and other patients whose appointments are after yours.  Also, if you miss three or more appointments without notifying the office, you may be dismissed from the clinic at the provider?s discretion.    ?  ?For prescription refill requests, have your pharmacy contact our office and allow 72 hours for refills to be completed.   ? ?Today you received the following chemotherapy and/or immunotherapy agents: oxaliplatin    ?  ?To help prevent nausea and vomiting after your treatment, we encourage you to take your nausea medication as directed. ? ?BELOW ARE SYMPTOMS THAT SHOULD BE REPORTED IMMEDIATELY: ?*FEVER GREATER THAN 100.4 F (38 ?C) OR HIGHER ?*CHILLS OR SWEATING ?*NAUSEA AND VOMITING THAT IS NOT CONTROLLED WITH YOUR NAUSEA MEDICATION ?*UNUSUAL SHORTNESS OF BREATH ?*UNUSUAL BRUISING OR BLEEDING ?*URINARY PROBLEMS (pain or burning when urinating, or frequent urination) ?*BOWEL PROBLEMS (unusual diarrhea, constipation, pain near the anus) ?TENDERNESS IN MOUTH AND THROAT WITH OR WITHOUT PRESENCE OF ULCERS (sore throat, sores in mouth, or a toothache) ?UNUSUAL RASH, SWELLING OR PAIN  ?UNUSUAL VAGINAL DISCHARGE OR ITCHING  ? ?Items with * indicate a potential emergency and should be followed up as soon as possible or go to the Emergency Department if any problems should occur. ? ?Please show the CHEMOTHERAPY ALERT CARD or IMMUNOTHERAPY ALERT CARD at check-in  to the Emergency Department and triage nurse. ? ?Should you have questions after your visit or need to cancel or reschedule your appointment, please contact [facility]  Dept: [phone]  and follow the prompts.  Office hours are 8:00 a.m. to 4:30 p.m. Monday - Friday. Please note that voicemails left after 4:00 p.m. may not be returned until the following business day.  We are closed weekends and major holidays. You have access to a nurse at all times for urgent questions. Please call the main number to the clinic Dept: [phone] and follow the prompts. ? ? ?For any non-urgent questions, you may also contact your provider using MyChart. We now offer e-Visits for anyone 18 and older to request care online for non-urgent symptoms. For details visit [email or web address]. ?  ?Also download the MyChart app! Go to the app store, search "MyChart", open the app, select [place], and log in with your MyChart username and password. ? ?Due to Covid, a mask is required upon entering the hospital/clinic. If you do not have a mask, one will be given to you upon arrival. For doctor visits, patients may have 1 support person aged 18 or older with them. For treatment visits, patients cannot have anyone with them due to current Covid guidelines and our immunocompromised population.  ? ?

## 2021-08-31 NOTE — Progress Notes (Signed)
Ok to proceed with ALT of 91 per Dr. Burr Medico ?

## 2021-09-07 ENCOUNTER — Telehealth: Payer: Self-pay

## 2021-09-07 NOTE — Telephone Encounter (Signed)
Notified Patient of prior authorization approval for Dronabinol 2.'5mg'$  Capsules.Medication is approved through 09/07/2022. Patient's Pharmacy notified. No other needs or concerns voiced at this time. ?

## 2021-09-21 ENCOUNTER — Other Ambulatory Visit: Payer: Self-pay

## 2021-09-21 ENCOUNTER — Inpatient Hospital Stay: Payer: BC Managed Care – PPO

## 2021-09-21 ENCOUNTER — Inpatient Hospital Stay (HOSPITAL_BASED_OUTPATIENT_CLINIC_OR_DEPARTMENT_OTHER): Payer: BC Managed Care – PPO | Admitting: Hematology

## 2021-09-21 VITALS — BP 131/89 | HR 83 | Temp 98.0°F | Resp 18 | Wt 189.0 lb

## 2021-09-21 DIAGNOSIS — C20 Malignant neoplasm of rectum: Secondary | ICD-10-CM

## 2021-09-21 DIAGNOSIS — Z5111 Encounter for antineoplastic chemotherapy: Secondary | ICD-10-CM | POA: Diagnosis not present

## 2021-09-21 LAB — CBC WITH DIFFERENTIAL (CANCER CENTER ONLY)
Abs Immature Granulocytes: 0.02 10*3/uL (ref 0.00–0.07)
Basophils Absolute: 0 10*3/uL (ref 0.0–0.1)
Basophils Relative: 0 %
Eosinophils Absolute: 0.4 10*3/uL (ref 0.0–0.5)
Eosinophils Relative: 9 %
HCT: 40 % (ref 39.0–52.0)
Hemoglobin: 13.2 g/dL (ref 13.0–17.0)
Immature Granulocytes: 0 %
Lymphocytes Relative: 35 %
Lymphs Abs: 1.6 10*3/uL (ref 0.7–4.0)
MCH: 29.5 pg (ref 26.0–34.0)
MCHC: 33 g/dL (ref 30.0–36.0)
MCV: 89.5 fL (ref 80.0–100.0)
Monocytes Absolute: 0.8 10*3/uL (ref 0.1–1.0)
Monocytes Relative: 17 %
Neutro Abs: 1.9 10*3/uL (ref 1.7–7.7)
Neutrophils Relative %: 39 %
Platelet Count: 244 10*3/uL (ref 150–400)
RBC: 4.47 MIL/uL (ref 4.22–5.81)
RDW: 22.6 % — ABNORMAL HIGH (ref 11.5–15.5)
WBC Count: 4.8 10*3/uL (ref 4.0–10.5)
nRBC: 0 % (ref 0.0–0.2)

## 2021-09-21 LAB — CMP (CANCER CENTER ONLY)
ALT: 76 U/L — ABNORMAL HIGH (ref 0–44)
AST: 54 U/L — ABNORMAL HIGH (ref 15–41)
Albumin: 4.2 g/dL (ref 3.5–5.0)
Alkaline Phosphatase: 56 U/L (ref 38–126)
Anion gap: 6 (ref 5–15)
BUN: 11 mg/dL (ref 6–20)
CO2: 24 mmol/L (ref 22–32)
Calcium: 9.1 mg/dL (ref 8.9–10.3)
Chloride: 107 mmol/L (ref 98–111)
Creatinine: 1.03 mg/dL (ref 0.61–1.24)
GFR, Estimated: >60 mL/min (ref >60)
Glucose, Bld: 135 mg/dL — ABNORMAL HIGH (ref 70–99)
Potassium: 3.8 mmol/L (ref 3.5–5.1)
Sodium: 137 mmol/L (ref 135–145)
Total Bilirubin: 0.5 mg/dL (ref 0.3–1.2)
Total Protein: 6.9 g/dL (ref 6.5–8.1)

## 2021-09-21 MED ORDER — SODIUM CHLORIDE 0.9 % IV SOLN
150.0000 mg | Freq: Once | INTRAVENOUS | Status: AC
  Administered 2021-09-21: 150 mg via INTRAVENOUS
  Filled 2021-09-21: qty 150

## 2021-09-21 MED ORDER — SODIUM CHLORIDE 0.9 % IV SOLN
10.0000 mg | Freq: Once | INTRAVENOUS | Status: AC
  Administered 2021-09-21: 10 mg via INTRAVENOUS
  Filled 2021-09-21: qty 10

## 2021-09-21 MED ORDER — OMEPRAZOLE 20 MG PO CPDR: 20.0000 mg | DELAYED_RELEASE_CAPSULE | Freq: Every day | ORAL | 0 refills | Status: DC

## 2021-09-21 MED ORDER — PALONOSETRON HCL INJECTION 0.25 MG/5ML
0.2500 mg | Freq: Once | INTRAVENOUS | Status: AC
  Administered 2021-09-21: 0.25 mg via INTRAVENOUS
  Filled 2021-09-21: qty 5

## 2021-09-21 MED ORDER — DEXTROSE 5 % IV SOLN: Freq: Once | INTRAVENOUS | Status: AC

## 2021-09-21 MED ORDER — CAPECITABINE 500 MG PO TABS: ORAL_TABLET | ORAL | 1 refills | Status: DC

## 2021-09-21 MED ORDER — OXALIPLATIN CHEMO INJECTION 100 MG/20ML
100.0000 mg/m2 | Freq: Once | INTRAVENOUS | Status: AC
  Administered 2021-09-21: 205 mg via INTRAVENOUS
  Filled 2021-09-21: qty 40

## 2021-09-21 NOTE — Patient Instructions (Signed)
[facility]ICAL [facility]COL[facility]   ?Discharge Instructions: ?Thank you for choosing [facility] to provide your oncology and hematology care.  ? ?If you have a lab appointment with the Cancer Center, please go directly to the Cancer Center and check in at the registration area. ?  ?Wear comfortable clothing and clothing appropriate for easy access to any Portacath or PICC line.  ? ?We strive to give you quality time with your provider. You may need to reschedule your appointment if you arrive late (15 or more minutes).  Arriving late affects you and other patients whose appointments are after yours.  Also, if you miss three or more appointments without notifying the office, you may be dismissed from the clinic at the provider?s discretion.    ?  ?For prescription refill requests, have your pharmacy contact our office and allow 72 hours for refills to be completed.   ? ?Today you received the following chemotherapy and/or immunotherapy agents: oxaliplatin    ?  ?To help prevent nausea and vomiting after your treatment, we encourage you to take your nausea medication as directed. ? ?BELOW ARE SYMPTOMS THAT SHOULD BE REPORTED IMMEDIATELY: ?*FEVER GREATER THAN 100.4 F (38 ?C) OR HIGHER ?*CHILLS OR SWEATING ?*NAUSEA AND VOMITING THAT IS NOT CONTROLLED WITH YOUR NAUSEA MEDICATION ?*UNUSUAL SHORTNESS OF BREATH ?*UNUSUAL BRUISING OR BLEEDING ?*URINARY PROBLEMS (pain or burning when urinating, or frequent urination) ?*BOWEL PROBLEMS (unusual diarrhea, constipation, pain near the anus) ?TENDERNESS IN MOUTH AND THROAT WITH OR WITHOUT PRESENCE OF ULCERS (sore throat, sores in mouth, or a toothache) ?UNUSUAL RASH, SWELLING OR PAIN  ?UNUSUAL VAGINAL DISCHARGE OR ITCHING  ? ?Items with * indicate a potential emergency and should be followed up as soon as possible or go to the Emergency Department if any problems should occur. ? ?Please show the CHEMOTHERAPY ALERT CARD or IMMUNOTHERAPY ALERT CARD at check-in  to the Emergency Department and triage nurse. ? ?Should you have questions after your visit or need to cancel or reschedule your appointment, please contact [facility]  Dept: [phone]  and follow the prompts.  Office hours are 8:00 a.m. to 4:30 p.m. Monday - Friday. Please note that voicemails left after 4:00 p.m. may not be returned until the following business day.  We are closed weekends and major holidays. You have access to a nurse at all times for urgent questions. Please call the main number to the clinic Dept: [phone] and follow the prompts. ? ? ?For any non-urgent questions, you may also contact your provider using MyChart. We now offer e-Visits for anyone 18 and older to request care online for non-urgent symptoms. For details visit [email or web address]. ?  ?Also download the MyChart app! Go to the app store, search "MyChart", open the app, select [place], and log in with your MyChart username and password. ? ?Due to Covid, a mask is required upon entering the hospital/clinic. If you do not have a mask, one will be given to you upon arrival. For doctor visits, patients may have 1 support person aged 18 or older with them. For treatment visits, patients cannot have anyone with them due to current Covid guidelines and our immunocompromised population.  ? ?

## 2021-09-21 NOTE — Addendum Note (Signed)
Addended by: [name] on: 09/21/2021 11:26 AM ? ? Modules accepted: Orders ? ?

## 2021-09-21 NOTE — Progress Notes (Addendum)
?[name]   ?Telephone:([phone] [phone]   ?Clinic Follow up Note  ? ?Patient Care Team: ?Jerry Late, MD as PCP - General (Family Medicine) ?Jerry November, MD as Consulting Physician (Gastroenterology) ?Jerry Merle, MD as Consulting Physician (Hematology) ?Jerry Allison ?Jerry Rudd, MD as Consulting Physician (Radiation Oncology) ?Jerry Allison, Jerry Goody, RN as Sales executive (Oncology) ? ?Date of Service:  09/21/2021 ? ?CHIEF COMPLAINT: f/u of rectal cancer ? ?CURRENT THERAPY:  ?Neoadjuvant CAPOX, with Xeloda 1500mg  AM/2000mg  PM 2 weeks on/1 week off, starting 05/10/21, dose reduced due to AEs  ? ?ASSESSMENT & PLAN:  ?Jerry Allison is a 46 y.o. male with  ? ?1. 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 He was referred to Dr. Candis Allison and proceeded to colonoscopy on 03/07/21 showing a partially-obstructing tumor in proximal rectum. Biopsy confirmed adenocarcinoma and a tubular adenoma polyp. ?-baseline CEA that day was normal at 1.7. ?-staging CT CAP 03/09/21 was negative for metastatic disease. ?-pelvic MRI 03/27/21 was also negative for nodal metastasis but was not able to give T stage  ?-he underwent EUS on 04/27/21 with Dr. Ardis Allison showing: 4 cm rectal adenocarcinoma invading into and through the muscularis propria layer of rectal wall; 1.5 cm satellite nodule located 5-8 mm distal to tumor. This is staged as T3 ?-he began CAPOX on 05/10/21. He tolerated first cycle poorly with a lot of vomiting and fatigue. He tolerated cycle 2 better with Emend and reduced oxali dose. ?-we held cycle 3 due to worsening transaminitis. Abdomen US on 06/29/21 was negative for metastatic disease, showed only mild steatosis. ?-labs reviewed. Adequate to proceed with treatment at same reduced dose of oxaliplatin at 100mg /m2, continue Xeloda to 1500mg  q12h for 14 days  ?-will move on to chemoRT in 3-4 weeks. Pt was  surprised to know that he needs Xeloda with radiation, I explained the rational of concurrent RT and encouraged him to take it, he agreed   ?  ?2. Chemo Side Effects: Low appetite, Reflux ?-he reports difficulty eating, in part due to the nausea and in part from taste change. I called in [place] for him on 08/31/21. ?-he reports a "heavy" feeling/pain in his mid chest, especially after he eats. I called in protonix for him to use. ?  ?3. Iron deficient anemia ?-work up on 03/29/21 showed: iron 23, ferritin 4, hgb 11.1.  This is secondary to rectal bleeding from the tumor. ?-he received IV iron on 05/15/21. He reports he had terrible constipation following the infusion. ?-I recommend he take oral iron or prenatal vitamin. ?-ferritin improved to 50 on 08/31/21. ?  ?  ?PLAN:  ?-proceed with C6 oxali at same reduced dose (last dose) ?-start capecitabine 1500mg  q12h on day 1-14 today ?-I called in protonix and refilled Xeloda  ?-appointment with rad onc on 10/03/21 ?-lab and f/u on 5/15 and every 2 weeks ? -prefers early morning appointments ? ? ?No problem-specific Assessment & Plan notes found for this encounter. ? ? ?SUMMARY OF ONCOLOGIC HISTORY: ?Oncology History  ?Adenocarcinoma of rectum (Jerry Allison)  ?03/07/2021 Procedure  ? Colonoscopy, Dr. Candis Allison ? ?Impression ?- Malignant appearing partially obstructing tumor in the proximal rectum. Biopsied. Tattooed. ?- One 3 mm polyp in the distal rectum, removed with a cold snare. Resected and retrieved. ?- The examined portion of the ileum was normal. ?- The distal rectum and anal verge are normal on retroflexion Allison. ?  ?03/07/2021 Pathology Results  ? Diagnosis ?1. Rectum,  biopsy, mass ?- ADENOCARCINOMA ?- SEE COMMENT ?2. Rectum, biopsy, polyp (1) ?- TUBULAR ADENOMA (1 OF 1 FRAGMENTS) ?- NO HIGH-GRADE DYSPLASIA OR MALIGNANCY IDENTIFIED ?  ?03/09/2021 Imaging  ? CT CAP ? ?IMPRESSION: ?1. Asymmetric wall thickening in the proximal/mid rectum may ?correspond to mass seen on  endoscopy. ?2. Negative for adenopathy or evidence of distal metastatic disease. ?  ?03/27/2021 Imaging  ? MRI Pelvis ? ?IMPRESSION: ?1. Low sigmoid primary, greater than 15 cm from the anal verge, as ?detailed above. Poorly delineated secondary to underdistention in ?this region. ?2. No evidence of pelvic nodal metastasis. ?3. Trace perisigmoid fluid, new since the prior CT. ?  ?03/28/2021 Initial Diagnosis  ? Adenocarcinoma of rectum (Jerry Allison) ?  ?03/29/2021 Cancer Staging  ? Staging form: Colon and Rectum - Neuroendocine Tumors, AJCC 8th Edition ?- Clinical stage from 03/29/2021: Stage IIB (cT3, cN0, cM0) - Signed by Jerry Merle, MD on 06/20/2021 ?Stage prefix: Initial diagnosis ?Histologic grade (G): G2 ?Histologic grading system: 3 grade system ? ?  ?04/12/2021 Genetic Testing  ? Negative genetic testing on the CancerNext-Expanded+RNAinsight panel.  The report date is 04/12/2021. ? ?The CancerNext-Expanded gene panel offered by [facility] and includes sequencing and rearrangement analysis for the following 77 genes: AIP, ALK, APC*, ATM*, AXIN2, BAP1, BARD1, BLM, BMPR1A, BRCA1*, BRCA2*, BRIP1*, CDC73, CDH1*, CDK4, CDKN1B, CDKN2A, CHEK2*, CTNNA1, DICER1, FANCC, FH, FLCN, GALNT12, KIF1B, LZTR1, MAX, MEN1, MET, MLH1*, MSH2*, MSH3, MSH6*, MUTYH*, NBN, NF1*, NF2, NTHL1, PALB2*, PHOX2B, PMS2*, POT1, PRKAR1A, PTCH1, PTEN*, RAD51C*, RAD51D*, RB1, RECQL, RET, SDHA, SDHAF2, SDHB, SDHC, SDHD, SMAD4, SMARCA4, SMARCB1, SMARCE1, STK11, SUFU, TMEM127, TP53*, TSC1, TSC2, VHL and XRCC2 (sequencing and deletion/duplication); EGFR, EGLN1, HOXB13, KIT, MITF, PDGFRA, POLD1, and POLE (sequencing only); EPCAM and GREM1 (deletion/duplication only). DNA and RNA analyses performed for * genes.  ?  ?04/27/2021 Procedure  ? EUS, Dr. Ardis Allison ? ?Impression: ?- 4cm long, circumferential, at least uT3Nx rectal adenocarcinoma with distal edge located 10cm from the anal verge. 1.5cm satellite nodule located 5-48m distal to the tumor (around 9cm from the  anal verge). ?  ?05/10/2021 -  Chemotherapy  ? Patient is on Treatment Plan : RECTAL Xelox (Capeox) q21d x 6 cycles  ? ?  ?  ? ? ? ?INTERVAL HISTORY:  ?Jerry Minceyis here for a follow up of rectal cancer. He was last seen by me on 08/31/21. He presents to the clinic alone. ?He reports he had a heavy feeling in his mid chest after he ate. He notes this has resolved. ?  ?All other systems were reviewed with the patient and are negative. ? ?MEDICAL HISTORY:  ?Past Medical History:  ?Diagnosis Date  ? Anxiety   ? Family history of bladder cancer   ? Rectal cancer ([place] 02[place]  ? ? ?SURGICAL HISTORY: ?Past Surgical History:  ?Procedure Laterality Date  ? EUS N/A 04/27/2021  ? Procedure: LOWER ENDOSCOPIC ULTRASOUND (EUS);  Surgeon: JMilus Banister MD;  [facility];  Service: Endoscopy;  Laterality: N/A;  ? FLEXIBLE SIGMOIDOSCOPY N/A 04/27/2021  ? Procedure: FLEXIBLE SIGMOIDOSCOPY;  Surgeon: JMilus Banister MD;  [facility];  Service: Endoscopy;  Laterality: N/A;  ? NO PAST SURGERIES    ? ? ?I have reviewed the social history and family history with the patient and they are unchanged from previous note. ? ?ALLERGIES:  is allergic to shellfish allergy. ? ?MEDICATIONS:  ?Current Outpatient Medications  ?Medication Sig Dispense Refill  ? omeprazole (PRILOSEC) 20 MG capsule Take 1 capsule (20 mg total)  by mouth daily. 30 capsule 0  ? capecitabine (XELODA) 500 MG tablet Take 3 tabs every 12 hours. Take after meals. 14 days on and 7 days off 84 tablet 2  ? dicyclomine (BENTYL) 20 MG tablet Take 1 tablet (20 mg total) by mouth 3 (three) times daily as needed for spasms. 60 tablet 1  ? dronabinol (MARINOL) 2.5 MG capsule Take 1 capsule (2.5 mg total) by mouth 2 (two) times daily before a meal. 30 capsule 0  ? escitalopram (LEXAPRO) 10 MG tablet Take 5 mg by mouth daily.    ? ondansetron (ZOFRAN) 8 MG tablet Take 1 tablet (8 mg total) by mouth 2 (two) times daily as needed for refractory nausea /  vomiting. Start on day 3 after chemotherapy. 30 tablet 1  ? prochlorperazine (COMPAZINE) 10 MG tablet Take 1 tablet (10 mg total) by mouth every 6 (six) hours as needed (Nausea or vomiting). 30 tablet 1  ? ?No current f

## 2021-10-01 NOTE — Progress Notes (Addendum)
?Radiation Oncology         [phone] ?________________________________ ? ?Name: [place]        MRN: [id number]  ?Date of Service: 10/03/2021 DOB: [birth year] ? ?GG:YIRSWNI, Beverely Low, MD  [name], MD    ? ?REFERRING PHYSICIAN: [name], MD ? ? 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 ? ? ?HISTORY OF PRESENT ILLNESS: [place] is a 46 y.o. male with a diagnosis of rectal cancer. The patient noted several months of bright red blood per rectum and was seen by GI and underwent a colonoscopy with Dr. [name] on 03/07/21 that revealed an ulcerated partially obstructing large mass in the proximal rectum, circumferential and measuring up to 4 cm. A 3 mm polyp in the distal rectum was also noted. Biopsies revealed adenocarcinoma of the rectal mass and the small polyp was consistent with a tubular adenoma without dysplasia or malignancy. He had a CEA that was normal at 1.7. A CT CAP on 03/09/21 showed asymmetric wall thickening of the proximal/mid rectum and no evidence of metastatic disease was otherwise noted. MRI on 03/27/21 showed no adenopathy and measured the tumor at 5.6 cm though limited by underdistention within the sigmoid. T staging was not given, and the tumor was 15 cm from the anal verge.  After further discussion of his case in conference, it was felt that he had clinically T3 disease and localized in the mid rectum, so he received total neoadjuvant CAPOX chemotherapy between 05/10/2021 and completed 6 cycles on 09/21/2021.  He is seen to proceed with chemoradiation. ? ?PREVIOUS RADIATION THERAPY: No ? ? ?PAST MEDICAL HISTORY:  ?Past Medical History:  ?Diagnosis Date  ? Anxiety   ? Family history of bladder cancer   ? Rectal cancer ([place]) 02/2021  ?   ? ? ?PAST SURGICAL HISTORY: ?Past Surgical History:  ?Procedure Laterality Date  ? EUS N/A 04/27/2021  ? Procedure: LOWER ENDOSCOPIC ULTRASOUND (EUS);  Surgeon: [name], MD;  Location: [facility];  Service:  Endoscopy;  Laterality: N/A;  ? FLEXIBLE SIGMOIDOSCOPY N/A 04/27/2021  ? Procedure: FLEXIBLE SIGMOIDOSCOPY;  Surgeon: [name], MD;  Location: [facility];  Service: Endoscopy;  Laterality: N/A;  ? NO PAST SURGERIES    ? ? ? ?FAMILY HISTORY:  ?Family History  ?Problem Relation Age of Onset  ? Anxiety disorder Father   ? Healthy Brother   ? Bladder Cancer Maternal Grandmother 24  ? Healthy Half-Sister   ?     paternal half  ? Colon cancer Neg Hx   ? Esophageal cancer Neg Hx   ? Stomach cancer Neg Hx   ? Pancreatic cancer Neg Hx   ? Liver disease Neg Hx   ? Inflammatory bowel disease Neg Hx   ? ? ? ?SOCIAL HISTORY:  reports that he has never smoked. He has never used smokeless tobacco. He reports current alcohol use. He reports current drug use. Drug: Marijuana.  The patient is married and lives in [place]. He works in a Stage [employer]. He has three teenage children, and likes playing softball and basketball throughout the year. ? ? ?ALLERGIES: Shellfish allergy ? ? ?MEDICATIONS:  ?Current Outpatient Medications  ?Medication Sig Dispense Refill  ? capecitabine (XELODA) 500 MG tablet Take 3 tabs every 12 hours. Take after meals. Take on days of radiation, Monday through Friday 90 tablet 1  ? dicyclomine (BENTYL) 20 MG tablet Take 1 tablet (20 mg total) by mouth 3 (three) times daily as needed for spasms. [place]  tablet 1  ? dronabinol (MARINOL) 2.5 MG capsule Take 1 capsule (2.5 mg total) by mouth 2 (two) times daily before a meal. 30 capsule 0  ? escitalopram (LEXAPRO) 10 MG tablet Take 5 mg by mouth daily.    ? omeprazole (PRILOSEC) 20 MG capsule Take 1 capsule (20 mg total) by mouth daily. 30 capsule 0  ? ondansetron (ZOFRAN) 8 MG tablet Take 1 tablet (8 mg total) by mouth 2 (two) times daily as needed for refractory nausea / vomiting. Start on day 3 after chemotherapy. 30 tablet 1  ? prochlorperazine (COMPAZINE) 10 MG tablet Take 1 tablet (10 mg total) by mouth every 6 (six) hours as needed (Nausea or  vomiting). 30 tablet 1  ? ?No current facility-administered medications for this encounter.  ? ? ? ?REVIEW OF SYSTEMS: On review of systems, the patient reports that he is doing pretty well since chemotherapy. He has no longer noted blood per rectum. He has fluctuations in loose stools and also constipation. He reports some discomfort in his mid abdomen after eating, and reports he did have mucositis during chemotherapy as well as peripheral neuropathic symptoms. No other complaints are noted.  ? ?  ? ?PHYSICAL EXAM:  ?Wt Readings from Last 3 Encounters:  ?10/03/21 184 lb 2 oz (83.5 kg)  ?09/21/21 189 lb (85.7 kg)  ?08/31/21 186 lb 1.6 oz (84.4 kg)  ? ?Temp Readings from Last 3 Encounters:  ?10/03/21 (!) 97.2 ?F (36.2 ?C) (Temporal)  ?09/21/21 98 ?F (36.7 ?C) (Oral)  ?08/31/21 98.7 ?F (37.1 ?C) (Oral)  ? ?BP Readings from Last 3 Encounters:  ?10/03/21 130/89  ?09/21/21 131/89  ?08/31/21 (!) 146/95  ? ?Pulse Readings from Last 3 Encounters:  ?10/03/21 88  ?09/21/21 83  ?08/31/21 92  ? ?Pain Assessment ?Pain Score: 0-No pain/10 ? ?In general this is a well appearing caucasian male in no acute distress. He's alert and oriented x4 and appropriate throughout the examination. Cardiopulmonary assessment is negative for acute distress and he exhibits normal effort.  ? ? ? ?ECOG = 1 ? ?0 - Asymptomatic (Fully active, able to carry on all predisease activities without restriction) ? ?1 - Symptomatic but completely ambulatory (Restricted in physically strenuous activity but ambulatory and able to carry out work of a light or sedentary nature. For example, light housework, office work) ? ?2 - Symptomatic, <50% in bed during the day (Ambulatory and capable of all self care but unable to carry out any work activities. Up and about more than 50% of waking hours) ? ?3 - Symptomatic, >50% in bed, but not bedbound (Capable of only limited self-care, confined to bed or chair 50% or more of waking hours) ? ?4 - Bedbound (Completely  disabled. Cannot carry on any self-care. Totally confined to bed or chair) ? ?5 - Death ? ? Oken MM, Creech RH, Tormey DC, et al. [phone]). "Toxicity and response criteria of the [facility]". [place] Oncol. 5 (6): 649-55 ? ? ? ?LABORATORY DATA:  ?Lab Results  ?Component Value Date  ? WBC 4.8 09/21/2021  ? HGB 13.2 09/21/2021  ? HCT 40.0 09/21/2021  ? MCV 89.5 09/21/2021  ? PLT 244 09/21/2021  ? ?Lab Results  ?Component Value Date  ? NA 137 09/21/2021  ? K 3.8 09/21/2021  ? CL 107 09/21/2021  ? CO2 24 09/21/2021  ? ?Lab Results  ?Component Value Date  ? ALT 76 (H) 09/21/2021  ? AST 54 (H) 09/21/2021  ? ALKPHOS 56 09/21/2021  ?  BILITOT 0.5 09/21/2021  ? ?  ? ?RADIOGRAPHY: No results found. ?   ? ?IMPRESSION/PLAN: ?1. Stage IIB, cT3N0M0,  adenocarcinoma of the proximal rectum/sigmoid colon. Dr. Lisbeth Renshaw has reviewed the patient's course to date and recommends now proceeding with chemoradiation.  We anticipate starting treatment for him on 10/09/21. We discussed the risks, benefits, short, and long term effects of radiotherapy, as well as the curative intent, and the patient is interested in proceeding. Dr. Lisbeth Renshaw discusses the delivery and logistics of radiotherapy and anticipates a course of 5 1/2 weeks of radiotherapy. Written consent is obtained and placed in the chart, a copy was provided to the patient. We also discussed the rationale for preventative rehabilitation assessment with physical therapy. He is in agreement and we will place a referral to PT. He has a trip planned that he really wants to make work with his son that starts June 21-22. While this would start treatment earlier than 4 weeks, Dr. Burr Medico and Dr. Lisbeth Renshaw are in agreement to start on 10/09/21 provided his counts are stable. He will simulate today. ? ? ?In a visit lasting 45 minutes, greater than 50% of the time was spent face to face discussing the patient's condition, in preparation for the discussion, and coordinating the  patient's care.  ? ? ? ?Carola Rhine, PAC ? ? ?**Disclaimer: This note was dictated with voice recognition software. Similar sounding words can inadvertently be transcribed and this note may contain tr

## 2021-10-02 ENCOUNTER — Telehealth: Payer: Self-pay

## 2021-10-02 NOTE — Telephone Encounter (Signed)
Verified patient identity and reminded patient of their 9:00am-10/03/21 in-person appointment w/ Shona Simpson PA-C. I advised patient to arrive 61mn early for check-in. I left my extension [phone] case patient needs anything. Patient verbalized understanding of information. ?

## 2021-10-03 ENCOUNTER — Encounter: Payer: Self-pay | Admitting: Radiation Oncology

## 2021-10-03 ENCOUNTER — Ambulatory Visit
Admission: RE | Admit: 2021-10-03 | Discharge: 2021-10-03 | Disposition: A | Discharge status: Home / Self Care | Payer: BC Managed Care – PPO | Source: Ambulatory Visit | Attending: Radiation Oncology | Admitting: Radiation Oncology

## 2021-10-03 ENCOUNTER — Other Ambulatory Visit: Payer: Self-pay

## 2021-10-03 VITALS — BP 130/89 | HR 88 | Temp 97.2°F | Resp 18 | Ht 72.0 in | Wt 184.1 lb

## 2021-10-03 DIAGNOSIS — Z8052 Family history of malignant neoplasm of bladder: Secondary | ICD-10-CM | POA: Diagnosis not present

## 2021-10-03 DIAGNOSIS — C20 Malignant neoplasm of rectum: Secondary | ICD-10-CM

## 2021-10-03 DIAGNOSIS — D509 Iron deficiency anemia, unspecified: Secondary | ICD-10-CM | POA: Diagnosis not present

## 2021-10-03 DIAGNOSIS — Z510 Encounter for antineoplastic radiation therapy: Secondary | ICD-10-CM | POA: Diagnosis present

## 2021-10-03 DIAGNOSIS — Z79899 Other long term (current) drug therapy: Secondary | ICD-10-CM | POA: Diagnosis not present

## 2021-10-03 NOTE — Progress Notes (Signed)
Follow-up-new appointment. I verified patient's identity and began nursing interview. Patient reports very mild weakness in general due to chemo, but otherwise doing well. No other issues conveyed at this time. ? ?Meaningful use complete. ? ?BP 130/89 (BP Location: Left Arm, Patient Position: Sitting, Cuff Size: Normal)   Pulse 88   Temp (!) 97.2 ?F (36.2 ?C) (Temporal)   Resp 18   Ht 6' (1.829 m)   Wt 184 lb 2 oz (83.5 kg)   SpO2 98%   BMI 24.97 kg/m?  ? ?

## 2021-10-04 ENCOUNTER — Telehealth: Payer: Self-pay

## 2021-10-04 NOTE — Telephone Encounter (Signed)
Spoke with pt via telephone regarding scheduling lab appt on Friday 10/06/2021 per Dr. Ernestina Penna request.  Pt stated he can come at 8am on Friday.  Scheduled pt for lab appt on Friday 10/06/2021 at 8am.  Asked Dr. Burr Medico is she would like to keep the lab appt scheduled on Monday 10/09/2021 prior to her f/u appt with pt.  Dr. Burr Medico stated she may keep appt if the pt's lab results on Friday are out of range.  ?

## 2021-10-05 ENCOUNTER — Ambulatory Visit: Payer: BC Managed Care – PPO | Attending: Radiation Oncology

## 2021-10-05 DIAGNOSIS — C20 Malignant neoplasm of rectum: Secondary | ICD-10-CM | POA: Diagnosis present

## 2021-10-05 DIAGNOSIS — R293 Abnormal posture: Secondary | ICD-10-CM | POA: Diagnosis not present

## 2021-10-05 NOTE — Therapy (Signed)
?OUTPATIENT PHYSICAL THERAPY MALE PELVIC EVALUATION ? ? ?Patient Name: [place] ?MRN: [id number] ?DOB:February 17, 1976, 46 y.o., male ?Today's Date: 10/05/2021 ? ? PT End of Session - 10/05/21 1335   ? ? Visit Number 1   ? Date for PT Re-Evaluation 12/28/21   ? Authorization Type BCBS   ? PT Start Time 1145   ? PT Stop Time 3762   ? PT Time Calculation (min) 37 min   ? Activity Tolerance Patient tolerated treatment well   ? Behavior During Therapy [facility] for tasks assessed/performed   ? ?  ?  ? ?  ? ? ?Past Medical History:  ?Diagnosis Date  ? Anxiety   ? Family history of bladder cancer   ? Rectal cancer ([place]) 02/2021  ? ?Past Surgical History:  ?Procedure Laterality Date  ? EUS N/A 04/27/2021  ? Procedure: LOWER ENDOSCOPIC ULTRASOUND (EUS);  Surgeon: [name], MD;  Location: [facility];  Service: Endoscopy;  Laterality: N/A;  ? FLEXIBLE SIGMOIDOSCOPY N/A 04/27/2021  ? Procedure: FLEXIBLE SIGMOIDOSCOPY;  Surgeon: [name], MD;  Location: [facility];  Service: Endoscopy;  Laterality: N/A;  ? NO PAST SURGERIES    ? ?Patient Active Problem List  ? Diagnosis Date Noted  ? Iron deficiency anemia due to chronic blood loss 05/31/2021  ? 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 Genetic testing 04/13/2021  ? Family history of bladder cancer 03/29/2021  ? Adenocarcinoma of rectum ([place]) 03/28/2021  ? ? ?PCP: Derinda Late, MD ? ?REFERRING PROVIDER: Hayden Pedro, PA-C ? ?REFERRING DIAG: C20 (ICD-10-CM) - Adenocarcinoma of rectum ([place]) ? ?THERAPY DIAG:  ?Abnormal posture - Plan: PT plan of care cert/re-cert ? ?ONSET DATE: 03/28/2021 ? ?SUBJECTIVE:                                                                                                                                                                                          ? ?SUBJECTIVE STATEMENT: ?Pt states that he is here for one time appointment to learn what he can do to minimize impact of radiation. He finished first round of chemo on  09/21/21.  ?Fluid intake: states he could drink more fluid ?Patient confirms identification and approves PT to assess pelvic floor and treatment Yes ? ? ?PAIN:  ?Are you having pain? No ? ? ?PRECAUTIONS: None ? ?WEIGHT BEARING RESTRICTIONS No ? ?FALLS:  ?Has patient fallen in last 6 months? No ? ?LIVING ENVIRONMENT: ?Lives with: lives with their family ?Lives in: House/apartment ? 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 ?PLOF: Independent ? ?PATIENT GOALS to learn exercises to minimize impact of radiation ? ?  PERTINENT HISTORY:  ?Colorectal cancer ? ? ?BOWEL MOVEMENT ?Pain with bowel movement: Yes - prior to bowel movement ?Type of bowel movement:Frequency 4 days in between or up to 1-2x/day when things are moving well and Strain Yes - after his infusions, Miralax to help. Things are getting better now that he is one with chemo ?Fully empty rectum: No ?Leakage: No ?Pads: No ?Fiber supplement: No ? ?URINATION ?Pain with urination: No ?Fully empty bladder: Yes: - ?Stream:  WNL ?Urgency: No ?Frequency: 3x/day, less with infusions due to less fluid/food intake ?Leakage:  WNL ?Pads: No ? ?INTERCOURSE ?Pain with intercourse:  WNL ?[place]:  W NL ?Ejaculation: Yes: - ? ? ? ? ?OBJECTIVE 10/05/21: ? ?COGNITION: ? Overall cognitive status: Within functional limits for tasks assessed   ?  ?SENSATION: ? Light touch: Appears intact ? Proprioception: Appears intact ? ?MUSCLE LENGTH: ?Hamstrings: Right 30 deg; Left 30 deg ?Thomas test: Right WNL; Left WNL ? ? ? ?FUNCTIONAL TESTS:  ?Sit-up test: 3/3 ? ?GAIT: ? ?Comments: WNL ? ?POSTURE:  ?Posterior pelvic tilt, rounded shoulders and increased thoracic kyphosis ? ?LUMBARAROM/PROM ? ?A/PROM A/PROM  ?10/05/2021  ?Flexion 75%  ?Extension 75%  ?Right lateral flexion WNL  ?Left lateral flexion WNL   ?Right rotation WNL  ?Left rotation WNL  ? (Blank rows = not tested) ? ? ?LE MMT: ? ?MMT Right ?10/05/2021 Left ?10/05/2021  ?Hip flexion 5/5 5/5  ?Hip extension 5/5  5/5  ?Hip abduction 5/5 5/5  ?Hip adduction 5/5 5/5  ?Hip internal rotation 5/5 5/5  ?Hip external rotation 5/5 5/5  ?Knee flexion    ?Knee extension    ?Ankle dorsiflexion    ?Ankle plantarflexion    ?Ankle inversion    ?Ankle eversion    ? ? ? ? ? ? ?TODAY'S TREATMENT 10/05/21: ?EVAL  ?Manual: ?Soft tissue mobilization: ?Scar tissue mobilization: ?Myofascial release: ?Spinal mobilization: ?Internal pelvic floor techniques: ?Dry needling: ?Neuromuscular re-education: ?Core retraining:  ?Core facilitation: ?Form correction: ?Pelvic floor contraction training: ?Down training: ?Exercises: ?Stretches/mobility: ?Hip flexor stretch 30 sec bil ?Piriformis stretch 30 sec bil ?Hamstring stretch 30 sec bil ?Open books 5x bil ?Bent knee fall out 3x bil ?Lower trunk rotation 2 x 10 ?Cat/cow 10x ?Child's pose 10 breaths ?Strengthening: ?Therapeutic activities: ?Functional strengthening activities: ?Self-care: ?Water/fiber intake ?Squatty potty/relaxed toileting mechanics ?Rectal dilator education ?Bowel massage ? ? ? ?PATIENT EDUCATION:  ?Education details: See above self-care ?Person educated: Patient ?Education method: Explanation, Demonstration, Tactile cues, Verbal cues, and Handouts ?Education comprehension: verbalized understanding ? ? ?HOME EXERCISE PROGRAM: ?[id number] ? ?ASSESSMENT: ? ?CLINICAL IMPRESSION: ?Patient is a 46 y.o. male who was seen today for physical therapy evaluation and treatment for learning exercises to help mitigate impact of surgery and radiation to rectum. Exam findings demonstrate some minimal decreases in lumbar A/ROM, good hip strength, and decreased posterior chain flexibility. Initial treatment included stretches and mobility exercises to help keep low back/pelvic, muscles, and connective tissue moving, promote functional ability, and keep pain levels low. We discussed many lifestyle interventions to help promote healthy bowel movements as he begins radiation. Patient education performed on  rectal dilators and what they are used for; we discussed signs and symptoms that would indicate need for these as he moves forward through treatment. Due to no current dysfunction, we will not plan to schedule any appointments at this time, but he will call on PRN basis as he begins treatment.  ? ? ?OBJECTIVE IMPAIRMENTS decreased mobility, hypomobility, and increased fascial restrictions.  ? ?ACTIVITY LIMITATIONS  None currently .  ? ?PERSONAL FACTORS 1 comorbidity: colorectal cancer  are also affecting patient's functional outcome.  ? ? ?REHAB POTENTIAL: Good ? ?CLINICAL DECISION MAKING: Stable/uncomplicated ? ?EVALUATION COMPLEXITY: Low ? ? ?GOALS: ?Goals reviewed with patient? Yes ? ?SHORT TERM GOALS: Target date: 11/02/2021   ? ?Pt will be independent with HEP.  ? ?Baseline: ?Goal status: INITIAL ? ?2.  Pt will return for re-evaluation after radiation and surgery.  ?Baseline:  ?Goal status: INITIAL ? ? ? ?LONG TERM GOALS: Target date: 12/28/2021   ? ?Pt will be independent with advanced HEP.  ? ?Baseline:  ?Goal status: INITIAL ? ?2.  Pt will be report daily bowel movement without straining.  ?Baseline:  ?Goal status: INITIAL ? ?3.  Pt will report no episodes of fecal incontinence.  ?Baseline:  ?Goal status: INITIAL ? ?PLAN: ?PT FREQUENCY: 1x/week ? ?PT DURATION: 12 weeks ? ?PLANNED INTERVENTIONS: Therapeutic exercises, Therapeutic activity, Neuromuscular re-education, Balance training, Gait training, Patient/Family education, Joint mobilization, Dry Needling, Biofeedback, and Manual therapy ? ?PLAN FOR NEXT SESSION: Re-evaluate if patient returns for treatment due to increased dysfunction.  ? ?Heather Roberts, PT, DPT05/11/231:52 PM ? ? ?

## 2021-10-05 NOTE — Patient Instructions (Signed)
Squatty potty: ?When your knees are level or below the level of your hips, pelvic floor ?muscles are pressed against rectum, preventing ease of bowel movement. ?By getting knees above the level of the hips, these pelvic floor muscles ?relax, allowing easier passage of bowel movement. ?? Ways to get knees above hips: ?o Physiological scientist (7inch and 9inch versions) ?o Small stool ?o Roll of toilet paper under each foot ?o Hardback book or stack of magazines under each foot ? ?Relaxed Toileting mechanics: ?Once in this position, make sure to lean forward with forearms on thighs, ?wide knees, relaxed stomach, and breathe. ? ?Bowel massage: ?To assist with more regular and more comfortable bowel movements, try ?performing bowel massage nightly for 5-10 minutes. ?Place hands in the lower right side of your abdomen to start; in small ?circles, massage up, across, and down the left side of your abdomen. ?Pressure does not need to be hard, but just comfortable. ?You can use lotion or oil to make more comfortable. ? ?Try to get half your body weight in ounces of water in daily.  ? ?Fiber: 8-12 grams of fiber with each meal ?Psyllium husk is a great fiber supplement ? ?? Soluble fiber: ?o Slows down digestion ?o Increase sensation of fullness ?o Lowers cholesterol ?o Improves blood sugar ? ?? Insoluble fiber ?o Speeds up digestion ?o Eases and prevents constipation ?o Good for overall colon health ? ?Examples of fiber rich foods: ?Soluble Fiber Insoluble Fiber  ?Strawberries Strawberries  ?Blueberries Blueberries  ?Apples Apples  ?Beans Peas  ?Nuts and Seeds Walnuts and Almonds  ?Oatmeal Whole grains  ?Avocados Avocados  ?Brussels sprouts Coconut  ?Sweet potatoes Radish  ?Broccoli Popcorn  ?Turnips Turnips  ?Apricots Passionfruit  ?Nectarines Cauliflower  ?Carrots Prune  ? ? ? ?

## 2021-10-06 ENCOUNTER — Other Ambulatory Visit: Payer: Self-pay

## 2021-10-06 ENCOUNTER — Inpatient Hospital Stay: Payer: BC Managed Care – PPO

## 2021-10-06 DIAGNOSIS — L271 Localized skin eruption due to drugs and medicaments taken internally: Secondary | ICD-10-CM | POA: Insufficient documentation

## 2021-10-06 DIAGNOSIS — Z79899 Other long term (current) drug therapy: Secondary | ICD-10-CM | POA: Insufficient documentation

## 2021-10-06 DIAGNOSIS — K219 Gastro-esophageal reflux disease without esophagitis: Secondary | ICD-10-CM | POA: Insufficient documentation

## 2021-10-06 DIAGNOSIS — D509 Iron deficiency anemia, unspecified: Secondary | ICD-10-CM | POA: Insufficient documentation

## 2021-10-06 DIAGNOSIS — Z510 Encounter for antineoplastic radiation therapy: Secondary | ICD-10-CM | POA: Diagnosis not present

## 2021-10-06 DIAGNOSIS — R112 Nausea with vomiting, unspecified: Secondary | ICD-10-CM | POA: Insufficient documentation

## 2021-10-06 DIAGNOSIS — D500 Iron deficiency anemia secondary to blood loss (chronic): Secondary | ICD-10-CM

## 2021-10-06 DIAGNOSIS — R194 Change in bowel habit: Secondary | ICD-10-CM | POA: Insufficient documentation

## 2021-10-06 DIAGNOSIS — C20 Malignant neoplasm of rectum: Secondary | ICD-10-CM | POA: Insufficient documentation

## 2021-10-06 DIAGNOSIS — D630 Anemia in neoplastic disease: Secondary | ICD-10-CM

## 2021-10-06 LAB — CMP (CANCER CENTER ONLY)
ALT: 57 U/L — ABNORMAL HIGH (ref 0–44)
AST: 38 U/L (ref 15–41)
Albumin: 4.1 g/dL (ref 3.5–5.0)
Alkaline Phosphatase: 57 U/L (ref 38–126)
Anion gap: 6 (ref 5–15)
BUN: 12 mg/dL (ref 6–20)
CO2: 27 mmol/L (ref 22–32)
Calcium: 9.4 mg/dL (ref 8.9–10.3)
Chloride: 107 mmol/L (ref 98–111)
Creatinine: 1.11 mg/dL (ref 0.61–1.24)
GFR, Estimated: >60 mL/min (ref >60)
Glucose, Bld: 108 mg/dL — ABNORMAL HIGH (ref 70–99)
Potassium: 4.4 mmol/L (ref 3.5–5.1)
Sodium: 140 mmol/L (ref 135–145)
Total Bilirubin: 0.5 mg/dL (ref 0.3–1.2)
Total Protein: 6.8 g/dL (ref 6.5–8.1)

## 2021-10-06 LAB — CBC WITH DIFFERENTIAL (CANCER CENTER ONLY)
Abs Immature Granulocytes: 0.01 10*3/uL (ref 0.00–0.07)
Basophils Absolute: 0 10*3/uL (ref 0.0–0.1)
Basophils Relative: 0 %
Eosinophils Absolute: 0.3 10*3/uL (ref 0.0–0.5)
Eosinophils Relative: 6 %
HCT: 37.6 % — ABNORMAL LOW (ref 39.0–52.0)
Hemoglobin: 13 g/dL (ref 13.0–17.0)
Immature Granulocytes: 0 %
Lymphocytes Relative: 33 %
Lymphs Abs: 1.8 10*3/uL (ref 0.7–4.0)
MCH: 30.6 pg (ref 26.0–34.0)
MCHC: 34.6 g/dL (ref 30.0–36.0)
MCV: 88.5 fL (ref 80.0–100.0)
Monocytes Absolute: 1 10*3/uL (ref 0.1–1.0)
Monocytes Relative: 19 %
Neutro Abs: 2.3 10*3/uL (ref 1.7–7.7)
Neutrophils Relative %: 42 %
Platelet Count: 183 10*3/uL (ref 150–400)
RBC: 4.25 MIL/uL (ref 4.22–5.81)
RDW: 20.6 % — ABNORMAL HIGH (ref 11.5–15.5)
WBC Count: 5.4 10*3/uL (ref 4.0–10.5)
nRBC: 0 % (ref 0.0–0.2)

## 2021-10-06 LAB — IRON AND IRON BINDING CAPACITY (CC-WL,HP ONLY)
Iron: 63 ug/dL (ref 45–182)
Saturation Ratios: 15 % — ABNORMAL LOW (ref 17.9–39.5)
TIBC: 427 ug/dL (ref 250–450)
UIBC: 364 ug/dL (ref 117–376)

## 2021-10-06 LAB — FERRITIN: Ferritin: 73 ng/mL (ref 24–336)

## 2021-10-09 ENCOUNTER — Inpatient Hospital Stay: Payer: BC Managed Care – PPO

## 2021-10-09 ENCOUNTER — Encounter: Payer: Self-pay | Admitting: Hematology

## 2021-10-09 ENCOUNTER — Other Ambulatory Visit: Payer: Self-pay

## 2021-10-09 ENCOUNTER — Ambulatory Visit
Admission: RE | Admit: 2021-10-09 | Discharge: 2021-10-09 | Disposition: A | Discharge status: Home / Self Care | Payer: BC Managed Care – PPO | Source: Ambulatory Visit | Attending: Radiation Oncology | Admitting: Radiation Oncology

## 2021-10-09 ENCOUNTER — Inpatient Hospital Stay (HOSPITAL_BASED_OUTPATIENT_CLINIC_OR_DEPARTMENT_OTHER): Payer: BC Managed Care – PPO | Admitting: Hematology

## 2021-10-09 VITALS — BP 128/99 | HR 78 | Temp 98.2°F | Resp 18 | Ht 72.0 in | Wt 186.1 lb

## 2021-10-09 DIAGNOSIS — C20 Malignant neoplasm of rectum: Secondary | ICD-10-CM | POA: Diagnosis not present

## 2021-10-09 DIAGNOSIS — Z510 Encounter for antineoplastic radiation therapy: Secondary | ICD-10-CM | POA: Diagnosis not present

## 2021-10-09 LAB — RAD ONC ARIA SESSION SUMMARY
Course Elapsed Days: 0
Plan Fractions Treated to Date: 1
Plan Prescribed Dose Per Fraction: 1.8 Gy
Plan Total Fractions Prescribed: 25
Plan Total Prescribed Dose: 45 Gy
Reference Point Dosage Given to Date: 1.8 Gy
Reference Point Session Dosage Given: 1.8 Gy
Session Number: 1

## 2021-10-09 NOTE — Progress Notes (Addendum)
?[place]   ?[phone] [phone]   ?Clinic Follow up Note  ? ?Patient Care Team: ?Derinda Late, MD as PCP - General (Family Medicine) ?[name], MD as Consulting Physician (Gastroenterology) ?[name], MD as Consulting Physician (Hematology) ?Nadeen Landau ?Kyung Rudd, MD as Consulting Physician (Radiation Oncology) ?Earl Gala, Deliah Goody, RN as Sales executive (Oncology) ? ?Date of Service:  10/09/2021 ? ?CHIEF COMPLAINT: f/u of rectal cancer ? ?CURRENT THERAPY:  ?Concurrent chemoRT with Xeloda, starting 10/09/21 ? -Xeloda dose: 1535m BID ? ?ASSESSMENT & PLAN:  ?JCrosby Oriordanis a 46y.o. male with  ? ?1. 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 He was referred to Dr. CCandis Schatzand proceeded to colonoscopy on 03/07/21 showing a partially-obstructing tumor in proximal rectum. Biopsy confirmed adenocarcinoma and a tubular adenoma polyp. ?-baseline CEA that day was normal at 1.7. ?-staging CT CAP 03/09/21 was negative for metastatic disease. ?-pelvic MRI 03/27/21 was also negative for nodal metastasis but was not able to give T stage  ?-he underwent EUS on 04/27/21 with Dr. JArdis Hughsshowing: 4 cm rectal adenocarcinoma invading into and through the muscularis propria layer of rectal wall; 1.5 cm satellite nodule located 5-8 mm distal to tumor. This is staged as T3 ?-he began CAPOX on 05/10/21. He tolerated first cycle poorly with a lot of vomiting and fatigue. He tolerated cycle 2 better with Emend and reduced oxali dose. We held cycle 3 due to worsening transaminitis. Abdomen UKoreaon 06/29/21 was negative for metastatic disease, showed only mild steatosis. He completed 6 cycles on 09/21/21. ?-he is scheduled to begin concurrent chemoRT with Xeloda today ?-lab from 10/06/21 reviewed, his liver function is improved. We will continue to monitor weekly on chemoRT. ?-f/u in 2 weeks  ?-will repeat CT  scan after CCRT ?  ?2. Chemo Side Effects: Low appetite, Reflux ?-marinol called in on 08/31/21. ?-protonix called in 09/21/21 ?  ?3. Iron deficient anemia ?-work up on 03/29/21 showed: iron 23, ferritin 4, hgb 11.1.  This is secondary to rectal bleeding from the tumor. ?-he received IV iron on 05/15/21. He reports he had terrible constipation following the infusion. ?-I recommend he take oral iron or prenatal vitamin. ?-ferritin improved to 73 on 10/06/21. ?  ?  ?PLAN:  ?-start capecitabine 15050mq12h today with first dose radiation, M-F ?-proceed with first radiation today. ?-lab weekly ?-f/u every other week as scheduled ? ? ?No problem-specific Assessment & Plan notes found for this encounter. ? ? ?SUMMARY OF ONCOLOGIC HISTORY: ?Oncology History  ?[facility] ?[facility] Procedure  ? Colonoscopy, Dr. CuCandis Schatz ?Impression ?- Malignant appearing partially obstructing tumor in the proximal rectum. Biopsied. Tattooed. ?- One 3 mm polyp in the distal rectum, removed with a cold snare. Resected and retrieved. ?- The examined portion of the ileum was normal. ?- The distal rectum and anal verge are normal on retroflexion view. ?  ?03/07/2021 Pathology Results  ? Diagnosis ?1. Rectum, biopsy, mass ?- ADENOCARCINOMA ?- SEE COMMENT ?2. Rectum, biopsy, polyp (1) ?- TUBULAR ADENOMA (1 OF 1 FRAGMENTS) ?- NO HIGH-GRADE DYSPLASIA OR MALIGNANCY IDENTIFIED ?  ?03/09/2021 Imaging  ? CT CAP ? ?IMPRESSION: ?1. Asymmetric wall thickening in the proximal/mid rectum may ?correspond to mass seen on endoscopy. ?2. Negative for adenopathy or evidence of distal metastatic disease. ?  ?03/27/2021 Imaging  ? MRI Pelvis ? ?IMPRESSION: ?1. Low sigmoid primary, greater than 15 cm from the anal verge, as ?  detailed above. Poorly delineated secondary to underdistention in ?this region. ?2. No evidence of pelvic nodal metastasis. ?3. Trace perisigmoid fluid, new since the prior CT. ?  ?03/28/2021 Initial Diagnosis  ?  Adenocarcinoma of rectum ([place]) ?  ?03/29/2021 Cancer Staging  ? Staging form: Colon and Rectum - Neuroendocine Tumors, AJCC 8th Edition ?- Clinical stage from 03/29/2021: Stage IIB (cT3, cN0, cM0) - Signed by [name], MD on 06/20/2021 ?Stage prefix: Initial diagnosis ?Histologic grade (G): G2 ?Histologic grading system: 3 grade system ? ?  ?04/12/2021 Genetic Testing  ? Negative genetic testing on the CancerNext-Expanded+RNAinsight panel.  The report date is 04/12/2021. ? ?The CancerNext-Expanded gene panel offered by [facility] and includes sequencing and rearrangement analysis for the following 77 genes: AIP, ALK, APC*, ATM*, AXIN2, BAP1, BARD1, BLM, BMPR1A, BRCA1*, BRCA2*, BRIP1*, CDC73, CDH1*, CDK4, CDKN1B, CDKN2A, CHEK2*, CTNNA1, DICER1, FANCC, FH, FLCN, GALNT12, KIF1B, LZTR1, MAX, MEN1, MET, MLH1*, MSH2*, MSH3, MSH6*, MUTYH*, NBN, NF1*, NF2, NTHL1, PALB2*, PHOX2B, PMS2*, POT1, PRKAR1A, PTCH1, PTEN*, RAD51C*, RAD51D*, RB1, RECQL, RET, SDHA, SDHAF2, SDHB, SDHC, SDHD, SMAD4, SMARCA4, SMARCB1, SMARCE1, STK11, SUFU, TMEM127, TP53*, TSC1, TSC2, VHL and XRCC2 (sequencing and deletion/duplication); EGFR, EGLN1, HOXB13, KIT, MITF, PDGFRA, POLD1, and POLE (sequencing only); EPCAM and GREM1 (deletion/duplication only). DNA and RNA analyses performed for * genes.  ?  ?04/27/2021 Procedure  ? EUS, Dr. Ardis Hughs ? ?Impression: ?- 4cm long, circumferential, at least uT3Nx rectal adenocarcinoma with distal edge located 10cm from the anal verge. 1.5cm satellite nodule located 5-51m distal to the tumor (around 9cm from the anal verge). ?  ?05/10/2021 -  Chemotherapy  ? Patient is on Treatment Plan : RECTAL Xelox (Capeox) q21d x 6 cycles  ? ?   ? ? ? ?INTERVAL HISTORY:  ?Jerry Allison here for a follow up of rectal cancer. He was last seen by me on 09/21/21. He presents to the clinic alone. ?He reports he is doing well overall, no new concerns today. ?  ?All other systems were reviewed with the patient and are  negative. ? ?MEDICAL HISTORY:  ?Past Medical History:  ?Diagnosis Date  ? Anxiety   ? Family history of bladder cancer   ? [facility] cancer ([facility] 02[facility]  ? ? ?SURGICAL HISTORY: ?Past Surgical History:  ?Procedure Laterality Date  ? EUS N/A 04/27/2021  ? Procedure: LOWER ENDOSCOPIC ULTRASOUND (EUS);  Surgeon: JMilus Banister MD;  [facility];  Service: Endoscopy;  Laterality: N/A;  ? FLEXIBLE SIGMOIDOSCOPY N/A 04/27/2021  ? Procedure: FLEXIBLE SIGMOIDOSCOPY;  Surgeon: JMilus Banister MD;  [facility];  Service: Endoscopy;  Laterality: N/A;  ? NO PAST SURGERIES    ? ? ?I have reviewed the social history and family history with the patient and they are unchanged from previous note. ? ?ALLERGIES:  is allergic to shellfish allergy. ? ?MEDICATIONS:  ?Current Outpatient Medications  ?Medication Sig Dispense Refill  ? capecitabine (XELODA) 500 MG tablet Take 3 tabs every 12 hours. Take after meals. Take on days of radiation, Monday through Friday 90 tablet 1  ? dicyclomine (BENTYL) 20 MG tablet Take 1 tablet (20 mg total) by mouth 3 (three) times daily as needed for spasms. 60 tablet 1  ? dronabinol (MARINOL) 2.5 MG capsule Take 1 capsule (2.5 mg total) by mouth 2 (two) times daily before a meal. 30 capsule 0  ? escitalopram (LEXAPRO) 10 MG tablet Take 5 mg by mouth daily.    ? omeprazole (PRILOSEC) 20 MG capsule Take 1 capsule (20 mg total)  by mouth daily. 30 capsule 0  ? ondansetron (ZOFRAN) 8 MG tablet Take 1 tablet (8 mg total) by mouth 2 (two) times daily as needed for refractory nausea / vomiting. Start on day 3 after chemotherapy. 30 tablet 1  ? prochlorperazine (COMPAZINE) 10 MG tablet Take 1 tablet (10 mg total) by mouth every 6 (six) hours as needed (Nausea or vomiting). 30 tablet 1  ? ?No current facility-administered medications for this visit.  ? ? ?PHYSICAL EXAMINATION: ?ECOG PERFORMANCE STATUS: 1 - Symptomatic but completely ambulatory ? ?Vitals:  ? 10/09/21 0929  ?BP: (!) 128/99   ?Pulse: 78  ?Resp: 18  ?Temp: 98.2 ?F (36.8 ?C)  ?SpO2: 97%  ? ?Wt Readings from Last 3 Encounters:  ?10/09/21 186 lb 1.6 oz (84.4 kg)  ?10/03/21 184 lb 2 oz (83.5 kg)  ?09/21/21 189 lb (85.7 kg)  ?  ? ?GENERAL:alert, no distress and

## 2021-10-10 ENCOUNTER — Other Ambulatory Visit: Payer: Self-pay

## 2021-10-10 ENCOUNTER — Telehealth: Payer: Self-pay | Admitting: Hematology

## 2021-10-10 ENCOUNTER — Ambulatory Visit
Admission: RE | Admit: 2021-10-10 | Discharge: 2021-10-10 | Disposition: A | Discharge status: Home / Self Care | Payer: BC Managed Care – PPO | Source: Ambulatory Visit | Attending: Radiation Oncology | Admitting: Radiation Oncology

## 2021-10-10 DIAGNOSIS — Z510 Encounter for antineoplastic radiation therapy: Secondary | ICD-10-CM | POA: Diagnosis not present

## 2021-10-10 LAB — RAD ONC ARIA SESSION SUMMARY
Course Elapsed Days: 1
Plan Fractions Treated to Date: 2
Plan Prescribed Dose Per Fraction: 1.8 Gy
Plan Total Fractions Prescribed: 25
Plan Total Prescribed Dose: 45 Gy
Reference Point Dosage Given to Date: 3.6 Gy
Reference Point Session Dosage Given: 1.8 Gy
Session Number: 2

## 2021-10-10 NOTE — Telephone Encounter (Signed)
Scheduled follow-up appointments per 5/15 los. Patient is aware. ?

## 2021-10-11 ENCOUNTER — Ambulatory Visit
Admission: RE | Admit: 2021-10-11 | Discharge: 2021-10-11 | Disposition: A | Discharge status: Home / Self Care | Payer: BC Managed Care – PPO | Source: Ambulatory Visit | Attending: Radiation Oncology | Admitting: Radiation Oncology

## 2021-10-11 ENCOUNTER — Other Ambulatory Visit: Payer: Self-pay

## 2021-10-11 DIAGNOSIS — Z510 Encounter for antineoplastic radiation therapy: Secondary | ICD-10-CM | POA: Diagnosis not present

## 2021-10-11 LAB — RAD ONC ARIA SESSION SUMMARY
Course Elapsed Days: 2
Plan Fractions Treated to Date: 3
Plan Prescribed Dose Per Fraction: 1.8 Gy
Plan Total Fractions Prescribed: 25
Plan Total Prescribed Dose: 45 Gy
Reference Point Dosage Given to Date: 5.4 Gy
Reference Point Session Dosage Given: 1.8 Gy
Session Number: 3

## 2021-10-12 ENCOUNTER — Other Ambulatory Visit: Payer: Self-pay

## 2021-10-12 ENCOUNTER — Ambulatory Visit
Admission: RE | Admit: 2021-10-12 | Discharge: 2021-10-12 | Disposition: A | Discharge status: Home / Self Care | Payer: BC Managed Care – PPO | Source: Ambulatory Visit | Attending: Radiation Oncology | Admitting: Radiation Oncology

## 2021-10-12 DIAGNOSIS — Z510 Encounter for antineoplastic radiation therapy: Secondary | ICD-10-CM | POA: Diagnosis not present

## 2021-10-12 LAB — RAD ONC ARIA SESSION SUMMARY
Course Elapsed Days: 3
Plan Fractions Treated to Date: 4
Plan Prescribed Dose Per Fraction: 1.8 Gy
Plan Total Fractions Prescribed: 25
Plan Total Prescribed Dose: 45 Gy
Reference Point Dosage Given to Date: 7.2 Gy
Reference Point Session Dosage Given: 1.8 Gy
Session Number: 4

## 2021-10-12 NOTE — Progress Notes (Addendum)
Pt here for patient teaching.  Pt given Radiation and You booklet, skin care instructions, and Sonafine.  Reviewed areas of pertinence such as diarrhea, fatigue, hair loss, sexual and fertility changes, skin changes, and urinary and bladder changes . Pt able to give teach back of to pat skin, use unscented/gentle soap, use baby wipes, have Imodium on hand, drink plenty of water, and sitz bath,apply and avoid applying anything to skin within 4 hours of treatment. Pt verbalizes understanding of information given and will contact nursing with any questions or concerns.

## 2021-10-13 ENCOUNTER — Ambulatory Visit
Admission: RE | Admit: 2021-10-13 | Discharge: 2021-10-13 | Disposition: A | Discharge status: Home / Self Care | Payer: BC Managed Care – PPO | Source: Ambulatory Visit | Attending: Radiation Oncology | Admitting: Radiation Oncology

## 2021-10-13 ENCOUNTER — Other Ambulatory Visit: Payer: Self-pay

## 2021-10-13 DIAGNOSIS — Z510 Encounter for antineoplastic radiation therapy: Secondary | ICD-10-CM | POA: Diagnosis not present

## 2021-10-13 LAB — RAD ONC ARIA SESSION SUMMARY
Course Elapsed Days: 4
Plan Fractions Treated to Date: 5
Plan Prescribed Dose Per Fraction: 1.8 Gy
Plan Total Fractions Prescribed: 25
Plan Total Prescribed Dose: 45 Gy
Reference Point Dosage Given to Date: 9 Gy
Reference Point Session Dosage Given: 1.8 Gy
Session Number: 5

## 2021-10-16 ENCOUNTER — Other Ambulatory Visit: Payer: Self-pay

## 2021-10-16 ENCOUNTER — Ambulatory Visit
Admission: RE | Admit: 2021-10-16 | Discharge: 2021-10-16 | Disposition: A | Discharge status: Home / Self Care | Payer: BC Managed Care – PPO | Source: Ambulatory Visit | Attending: Radiation Oncology | Admitting: Radiation Oncology

## 2021-10-16 DIAGNOSIS — Z510 Encounter for antineoplastic radiation therapy: Secondary | ICD-10-CM | POA: Diagnosis not present

## 2021-10-16 LAB — RAD ONC ARIA SESSION SUMMARY
Course Elapsed Days: 7
Plan Fractions Treated to Date: 6
Plan Prescribed Dose Per Fraction: 1.8 Gy
Plan Total Fractions Prescribed: 25
Plan Total Prescribed Dose: 45 Gy
Reference Point Dosage Given to Date: 10.8 Gy
Reference Point Session Dosage Given: 1.8 Gy
Session Number: 6

## 2021-10-17 ENCOUNTER — Ambulatory Visit
Admission: RE | Admit: 2021-10-17 | Discharge: 2021-10-17 | Disposition: A | Discharge status: Home / Self Care | Payer: BC Managed Care – PPO | Source: Ambulatory Visit | Attending: Radiation Oncology | Admitting: Radiation Oncology

## 2021-10-17 ENCOUNTER — Inpatient Hospital Stay: Payer: BC Managed Care – PPO

## 2021-10-17 ENCOUNTER — Other Ambulatory Visit: Payer: Self-pay

## 2021-10-17 DIAGNOSIS — D630 Anemia in neoplastic disease: Secondary | ICD-10-CM

## 2021-10-17 DIAGNOSIS — D500 Iron deficiency anemia secondary to blood loss (chronic): Secondary | ICD-10-CM

## 2021-10-17 DIAGNOSIS — C20 Malignant neoplasm of rectum: Secondary | ICD-10-CM

## 2021-10-17 DIAGNOSIS — Z510 Encounter for antineoplastic radiation therapy: Secondary | ICD-10-CM | POA: Diagnosis not present

## 2021-10-17 LAB — RAD ONC ARIA SESSION SUMMARY
Course Elapsed Days: 8
Plan Fractions Treated to Date: 7
Plan Prescribed Dose Per Fraction: 1.8 Gy
Plan Total Fractions Prescribed: 25
Plan Total Prescribed Dose: 45 Gy
Reference Point Dosage Given to Date: 12.6 Gy
Reference Point Session Dosage Given: 1.8 Gy
Session Number: 7

## 2021-10-17 LAB — CBC WITH DIFFERENTIAL (CANCER CENTER ONLY)
Abs Immature Granulocytes: 0.01 10*3/uL (ref 0.00–0.07)
Basophils Absolute: 0 10*3/uL (ref 0.0–0.1)
Basophils Relative: 1 %
Eosinophils Absolute: 0.3 10*3/uL (ref 0.0–0.5)
Eosinophils Relative: 7 %
HCT: 35.6 % — ABNORMAL LOW (ref 39.0–52.0)
Hemoglobin: 12.3 g/dL — ABNORMAL LOW (ref 13.0–17.0)
Immature Granulocytes: 0 %
Lymphocytes Relative: 30 %
Lymphs Abs: 1.2 10*3/uL (ref 0.7–4.0)
MCH: 31.5 pg (ref 26.0–34.0)
MCHC: 34.6 g/dL (ref 30.0–36.0)
MCV: 91 fL (ref 80.0–100.0)
Monocytes Absolute: 0.9 10*3/uL (ref 0.1–1.0)
Monocytes Relative: 23 %
Neutro Abs: 1.6 10*3/uL — ABNORMAL LOW (ref 1.7–7.7)
Neutrophils Relative %: 39 %
Platelet Count: 196 10*3/uL (ref 150–400)
RBC: 3.91 MIL/uL — ABNORMAL LOW (ref 4.22–5.81)
RDW: 21.3 % — ABNORMAL HIGH (ref 11.5–15.5)
WBC Count: 3.9 10*3/uL — ABNORMAL LOW (ref 4.0–10.5)
nRBC: 0 % (ref 0.0–0.2)

## 2021-10-17 LAB — CMP (CANCER CENTER ONLY)
ALT: 76 U/L — ABNORMAL HIGH (ref 0–44)
AST: 43 U/L — ABNORMAL HIGH (ref 15–41)
Albumin: 4.1 g/dL (ref 3.5–5.0)
Alkaline Phosphatase: 59 U/L (ref 38–126)
Anion gap: 4 — ABNORMAL LOW (ref 5–15)
BUN: 15 mg/dL (ref 6–20)
CO2: 28 mmol/L (ref 22–32)
Calcium: 9.2 mg/dL (ref 8.9–10.3)
Chloride: 105 mmol/L (ref 98–111)
Creatinine: 1.05 mg/dL (ref 0.61–1.24)
GFR, Estimated: >60 mL/min (ref >60)
Glucose, Bld: 104 mg/dL — ABNORMAL HIGH (ref 70–99)
Potassium: 4.3 mmol/L (ref 3.5–5.1)
Sodium: 137 mmol/L (ref 135–145)
Total Bilirubin: 0.5 mg/dL (ref 0.3–1.2)
Total Protein: 6.8 g/dL (ref 6.5–8.1)

## 2021-10-17 LAB — IRON AND IRON BINDING CAPACITY (CC-WL,HP ONLY)
Iron: 182 ug/dL (ref 45–182)
Saturation Ratios: 42 % — ABNORMAL HIGH (ref 17.9–39.5)
TIBC: 435 ug/dL (ref 250–450)
UIBC: 253 ug/dL (ref 117–376)

## 2021-10-17 LAB — FERRITIN: Ferritin: 60 ng/mL (ref 24–336)

## 2021-10-18 ENCOUNTER — Other Ambulatory Visit: Payer: Self-pay

## 2021-10-18 ENCOUNTER — Ambulatory Visit
Admission: RE | Admit: 2021-10-18 | Discharge: 2021-10-18 | Disposition: A | Discharge status: Home / Self Care | Payer: BC Managed Care – PPO | Source: Ambulatory Visit | Attending: Radiation Oncology | Admitting: Radiation Oncology

## 2021-10-18 DIAGNOSIS — C20 Malignant neoplasm of rectum: Secondary | ICD-10-CM

## 2021-10-18 DIAGNOSIS — Z510 Encounter for antineoplastic radiation therapy: Secondary | ICD-10-CM | POA: Diagnosis not present

## 2021-10-18 LAB — RAD ONC ARIA SESSION SUMMARY
Course Elapsed Days: 9
Plan Fractions Treated to Date: 8
Plan Prescribed Dose Per Fraction: 1.8 Gy
Plan Total Fractions Prescribed: 25
Plan Total Prescribed Dose: 45 Gy
Reference Point Dosage Given to Date: 14.4 Gy
Reference Point Session Dosage Given: 1.8 Gy
Session Number: 8

## 2021-10-18 MED ORDER — CAPECITABINE 500 MG PO TABS: ORAL_TABLET | ORAL | 0 refills | Status: DC

## 2021-10-19 ENCOUNTER — Ambulatory Visit
Admission: RE | Admit: 2021-10-19 | Discharge: 2021-10-19 | Disposition: A | Discharge status: Home / Self Care | Payer: BC Managed Care – PPO | Source: Ambulatory Visit | Attending: Radiation Oncology | Admitting: Radiation Oncology

## 2021-10-19 ENCOUNTER — Other Ambulatory Visit: Payer: Self-pay

## 2021-10-19 DIAGNOSIS — Z510 Encounter for antineoplastic radiation therapy: Secondary | ICD-10-CM | POA: Diagnosis not present

## 2021-10-19 LAB — RAD ONC ARIA SESSION SUMMARY
Course Elapsed Days: 10
Plan Fractions Treated to Date: 9
Plan Prescribed Dose Per Fraction: 1.8 Gy
Plan Total Fractions Prescribed: 25
Plan Total Prescribed Dose: 45 Gy
Reference Point Dosage Given to Date: 16.2 Gy
Reference Point Session Dosage Given: 1.8 Gy
Session Number: 9

## 2021-10-20 ENCOUNTER — Other Ambulatory Visit: Payer: Self-pay

## 2021-10-20 ENCOUNTER — Ambulatory Visit
Admission: RE | Admit: 2021-10-20 | Discharge: 2021-10-20 | Disposition: A | Discharge status: Home / Self Care | Payer: BC Managed Care – PPO | Source: Ambulatory Visit | Attending: Radiation Oncology | Admitting: Radiation Oncology

## 2021-10-20 DIAGNOSIS — Z510 Encounter for antineoplastic radiation therapy: Secondary | ICD-10-CM | POA: Diagnosis not present

## 2021-10-20 LAB — RAD ONC ARIA SESSION SUMMARY
Course Elapsed Days: 11
Plan Fractions Treated to Date: 10
Plan Prescribed Dose Per Fraction: 1.8 Gy
Plan Total Fractions Prescribed: 25
Plan Total Prescribed Dose: 45 Gy
Reference Point Dosage Given to Date: 18 Gy
Reference Point Session Dosage Given: 1.8 Gy
Session Number: 10

## 2021-10-22 NOTE — Progress Notes (Unsigned)
Jerry Allison   Telephone:(336) [phone] Fax:(336) [phone]   Clinic Follow up Note   Patient Care Team: Derinda Late, MD as PCP - General (Family Medicine) [name], MD as Consulting Physician (Gastroenterology) [name], MD as Consulting Physician (Hematology) [name], MD as Consulting Physician (Radiation Oncology) [name], RN as Oncology Nurse Navigator (Oncology) 10/25/2021  CHIEF COMPLAINT: Follow up rectal cancer  SUMMARY OF ONCOLOGIC HISTORY: Oncology History  Adenocarcinoma of rectum ([place])  03/07/2021 Procedure   Colonoscopy, Dr. [name]  Impression - Malignant appearing partially obstructing tumor in the proximal rectum. Biopsied. Tattooed. - One 3 mm polyp in the distal rectum, removed with a cold snare. Resected and retrieved. - The examined portion of the ileum was normal. - The distal rectum and anal verge are normal on retroflexion view.   03/07/2021 Pathology Results   Diagnosis 1. Rectum, biopsy, mass - ADENOCARCINOMA - SEE COMMENT 2. Rectum, biopsy, polyp (1) - TUBULAR ADENOMA (1 OF 1 FRAGMENTS) - NO HIGH-GRADE DYSPLASIA OR MALIGNANCY IDENTIFIED   03/09/2021 Imaging   CT CAP  IMPRESSION: 1. Asymmetric wall thickening in the proximal/mid rectum may correspond to mass seen on endoscopy. 2. Negative for adenopathy or evidence of distal metastatic disease.   03/27/2021 Imaging   MRI Pelvis  IMPRESSION: 1. Low sigmoid primary, greater than 15 cm from the anal verge, as detailed above. Poorly delineated secondary to underdistention in this region. 2. No evidence of pelvic nodal metastasis. 3. Trace perisigmoid fluid, new since the prior CT.   03/28/2021 Initial Diagnosis   Adenocarcinoma of rectum ([place])   03/29/2021 Cancer Staging   Staging form: Colon and Rectum - Neuroendocine Tumors, AJCC 8th Edition - Clinical stage from 03/29/2021: Stage IIB (cT3, cN0, cM0) - Signed by [name], MD  on 06/20/2021 Stage prefix: Initial diagnosis Histologic grade (G): G2 Histologic grading system: 3 grade system    04/12/2021 Genetic Testing   Negative genetic testing on the CancerNext-Expanded+RNAinsight panel.  The report date is 04/12/2021.  The CancerNext-Expanded gene panel offered by [facility] and includes sequencing and rearrangement analysis for the following 77 genes: AIP, ALK, APC*, ATM*, AXIN2, BAP1, BARD1, BLM, BMPR1A, BRCA1*, BRCA2*, BRIP1*, CDC73, CDH1*, CDK4, CDKN1B, CDKN2A, CHEK2*, CTNNA1, DICER1, FANCC, FH, FLCN, GALNT12, KIF1B, LZTR1, MAX, MEN1, MET, MLH1*, MSH2*, MSH3, MSH6*, MUTYH*, NBN, NF1*, NF2, NTHL1, PALB2*, PHOX2B, PMS2*, POT1, PRKAR1A, PTCH1, PTEN*, RAD51C*, RAD51D*, RB1, RECQL, RET, SDHA, SDHAF2, SDHB, SDHC, SDHD, SMAD4, SMARCA4, SMARCB1, SMARCE1, STK11, SUFU, TMEM127, TP53*, TSC1, TSC2, VHL and XRCC2 (sequencing and deletion/duplication); EGFR, EGLN1, HOXB13, KIT, MITF, PDGFRA, POLD1, and POLE (sequencing only); EPCAM and GREM1 (deletion/duplication only). DNA and RNA analyses performed for * genes.    04/27/2021 Procedure   EUS, Dr. Ardis Hughs  Impression: - 4cm long, circumferential, at least uT3Nx rectal adenocarcinoma with distal edge located 10cm from the anal verge. 1.5cm satellite nodule located 5-63m distal to the tumor (around 9cm from the anal verge).   05/10/2021 -  Chemotherapy   Patient is on Treatment Plan : RECTAL Xelox (Capeox) q21d x 6 cycles        CURRENT THERAPY:  S/p 6 cycles neoadjuvant CAPOX 05/10/21 - 09/21/21 followed by 2.   Concurrent chemoRT with Xeloda, starting 10/09/21             -Xeloda dose: 15083mBID  INTERVAL HISTORY: Mr. SiToreneturns for follow up as scheduled. Last seen by Dr. FeBurr Medico/15/23. He is here for week 3 chemoRT follow up.  Bowels are beginning to move more urgently with loose stool up to 4-5/day.  He started Imodium last night which seems to be helping.  He is able to eat and drink, mucositis from previous  chemo is improving.  He had mild nausea and one episode of vomiting early in chemo RT that has improved.  He has nausea meds on hand if needed.  Palms are slightly pink and a little sensitive, feet hurt if he has been very active that day.  He denies overt neuropathy.  Denies rectal pain, bleeding, fever, chills, cough, chest pain, dyspnea, or other new complaints.  All other systems were reviewed with the patient and are negative.  MEDICAL HISTORY:  Past Medical History:  Diagnosis Date   Anxiety    Family history of bladder cancer    Rectal cancer ([place]) 02/2021    SURGICAL HISTORY: Past Surgical History:  Procedure Laterality Date   EUS N/A 04/27/2021   Procedure: LOWER ENDOSCOPIC ULTRASOUND (EUS);  Surgeon: [name], MD;  [facility];  Service: Endoscopy;  Laterality: N/A;   FLEXIBLE SIGMOIDOSCOPY N/A 04/27/2021   Procedure: FLEXIBLE SIGMOIDOSCOPY;  Surgeon: [name], MD;  Location: [facility];  Service: Endoscopy;  Laterality: N/A;   NO PAST SURGERIES      I have reviewed the social history and family history with the patient and they are unchanged from previous note.  ALLERGIES:  is allergic to shellfish allergy.  MEDICATIONS:  Current Outpatient Medications  Medication Sig Dispense Refill   capecitabine (XELODA) 500 MG tablet Take 3 tabs every 12 hours. Take after meals. Take on days of radiation, Monday through Friday 120 tablet 0   dicyclomine (BENTYL) 20 MG tablet Take 1 tablet (20 mg total) by mouth 3 (three) times daily as needed for spasms. 60 tablet 1   dronabinol (MARINOL) 2.5 MG capsule Take 1 capsule (2.5 mg total) by mouth 2 (two) times daily before a meal. 30 capsule 0   escitalopram (LEXAPRO) 10 MG tablet Take 5 mg by mouth daily.     omeprazole (PRILOSEC) 20 MG capsule Take 1 capsule (20 mg total) by mouth daily. 30 capsule 0   ondansetron (ZOFRAN) 8 MG tablet Take 1 tablet (8 mg total) by mouth 2 (two) times daily as needed for  refractory nausea / vomiting. Start on day 3 after chemotherapy. 30 tablet 1   prochlorperazine (COMPAZINE) 10 MG tablet Take 1 tablet (10 mg total) by mouth every 6 (six) hours as needed (Nausea or vomiting). 30 tablet 1   No current facility-administered medications for this visit.    PHYSICAL EXAMINATION: ECOG PERFORMANCE STATUS: 1 - Symptomatic but completely ambulatory  Vitals:   10/25/21 0906  BP: 115/88  Pulse: 72  Resp: 18  Temp: 97.8 F (36.6 C)  SpO2: 99%   Filed Weights   10/25/21 0906  Weight: 188 lb 3.2 oz (85.4 kg)    GENERAL:alert, no distress and comfortable SKIN: palms with mild erythema  EYES: sclera clear LUNGS:  normal breathing effort HEART: no lower extremity edema NEURO: alert & oriented x 3 with fluent speech, no focal motor/sensory deficits External exam reveals no skin discoloration or breakdown  LABORATORY DATA:  I have reviewed the data as listed    Latest Ref Rng & Units 10/25/2021    8:50 AM 10/17/2021   12:13 PM 10/06/2021    8:20 AM  CBC  WBC 4.0 - 10.5 K/uL 3.2   3.9   5.4    Hemoglobin  13.0 - 17.0 g/dL 13.3   12.3   13.0    Hematocrit 39.0 - 52.0 % 38.8   35.6   37.6    Platelets 150 - 400 K/uL 173   196   183          Latest Ref Rng & Units 10/25/2021    8:50 AM 10/17/2021   12:13 PM 10/06/2021    8:20 AM  CMP  Glucose 70 - 99 mg/dL 119   104   108    BUN 6 - 20 mg/dL _0 Creatinine 0.61 - 1.24 mg/dL 1.10   1.05   1.11    Sodium 135 - 145 mmol/L 140   137   140    Potassium 3.5 - 5.1 mmol/L 4.3   4.3   4.4    Chloride 98 - 111 mmol/L 106   105   107    CO2 22 - 32 mmol/L _1 Calcium 8.9 - 10.3 mg/dL 9.6   9.2   9.4    Total Protein 6.5 - 8.1 g/dL 6.9   6.8   6.8    Total Bilirubin 0.3 - 1.2 mg/dL 0.5   0.5   0.5    Alkaline Phos 38 - 126 U/L 60   59   57    AST 15 - 41 U/L 34   43   38    ALT 0 - 44 U/L 53   76   57        RADIOGRAPHIC STUDIES: I have personally reviewed the radiological images  as listed and agreed with the findings in the report. No results found.   ASSESSMENT & PLAN: Michai Dieppa is a 46 y.o. male with    1. 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 He was referred to Dr. [name] and proceeded to colonoscopy on 03/07/21 showing a partially-obstructing tumor in proximal rectum. Biopsy confirmed adenocarcinoma and a tubular adenoma polyp. -baseline CEA that day was normal at 1.7. -staging CT CAP 03/09/21 was negative for metastatic disease. -pelvic MRI 03/27/21 was also negative for nodal metastasis but was not able to give T stage  -he underwent EUS on 04/27/21 with Dr. Ardis Hughs showing: 4 cm rectal adenocarcinoma invading into and through the muscularis propria layer of rectal wall; 1.5 cm satellite nodule located 5-8 mm distal to tumor. This is staged as T3 -he began CAPOX on 05/10/21. He tolerated first cycle poorly with a lot of vomiting and fatigue. He tolerated cycle 2 better with Emend and reduced oxali dose. We held cycle 3 due to worsening transaminitis. Abdomen US on 06/29/21 was negative for metastatic disease, showed only mild steatosis. He completed 6 cycles on 09/21/21. -he began concurrent chemoRT with Xeloda 1500 mg BID on 10/09/21 -Mr. Eichinger appears stable. He had n/v early in CCRT that has improved. His main SE's now are mild hand-foot syndrome and urgent/loose BM. Side effects are well managed with supportive care at home. He is able to continue working and function well.  -labs reviewed, LFTs continue to improve. ANC 1.4, we reviewed infection precautions. Adequate to continue concurrent chemoRT with xeloda at same dose -continue weekly lab, f/up in 2 weeks. He knows to let us know if he needs to be seen sooner.  -plan to repeat CT scan after CCRT   2. Chemo Side Effects: Low appetite,  Reflux, n/v, hand-foot syndrome, loose stool -marinol called in on 08/31/21. -protonix called in  09/21/21 -continue anti-emetics and imodium, hydrate    3. Iron deficient anemia -work up on 03/29/21 showed: iron 23, ferritin 4, hgb 11.1.  This is secondary to rectal bleeding from the tumor. -he received IV iron on 05/15/21. He reports he had terrible constipation following the infusion. -It was recommended to take oral iron or prenatal vitamin. -ferritin improved, 60 on 10/17/21; no need for additional IV iron at this time  PLAN: -Labs reviewed -Continue concurrent chemoRT with Xeloda same dose, 1500 mg BID on M-F days with radiation -Lab next week -F/up in 2 weeks -Restage after CCRT    All questions were answered. The patient knows to call the clinic with any problems, questions or concerns. No barriers to learning were detected.     Alla Feeling, NP 10/25/21

## 2021-10-24 ENCOUNTER — Other Ambulatory Visit: Payer: Self-pay

## 2021-10-24 ENCOUNTER — Ambulatory Visit
Admission: RE | Admit: 2021-10-24 | Discharge: 2021-10-24 | Disposition: A | Discharge status: Home / Self Care | Payer: BC Managed Care – PPO | Source: Ambulatory Visit | Attending: Radiation Oncology | Admitting: Radiation Oncology

## 2021-10-24 DIAGNOSIS — Z510 Encounter for antineoplastic radiation therapy: Secondary | ICD-10-CM | POA: Diagnosis not present

## 2021-10-24 LAB — RAD ONC ARIA SESSION SUMMARY
Course Elapsed Days: 15
Plan Fractions Treated to Date: 11
Plan Prescribed Dose Per Fraction: 1.8 Gy
Plan Total Fractions Prescribed: 25
Plan Total Prescribed Dose: 45 Gy
Reference Point Dosage Given to Date: 19.8 Gy
Reference Point Session Dosage Given: 1.8 Gy
Session Number: 11

## 2021-10-25 ENCOUNTER — Other Ambulatory Visit: Payer: Self-pay

## 2021-10-25 ENCOUNTER — Encounter: Payer: Self-pay | Admitting: Nurse Practitioner

## 2021-10-25 ENCOUNTER — Inpatient Hospital Stay: Payer: BC Managed Care – PPO

## 2021-10-25 ENCOUNTER — Inpatient Hospital Stay (HOSPITAL_BASED_OUTPATIENT_CLINIC_OR_DEPARTMENT_OTHER): Payer: BC Managed Care – PPO | Admitting: Nurse Practitioner

## 2021-10-25 ENCOUNTER — Ambulatory Visit
Admission: RE | Admit: 2021-10-25 | Discharge: 2021-10-25 | Disposition: A | Discharge status: Home / Self Care | Payer: BC Managed Care – PPO | Source: Ambulatory Visit | Attending: Radiation Oncology | Admitting: Radiation Oncology

## 2021-10-25 VITALS — BP 115/88 | HR 72 | Temp 97.8°F | Resp 18 | Ht 72.0 in | Wt 188.2 lb

## 2021-10-25 DIAGNOSIS — C20 Malignant neoplasm of rectum: Secondary | ICD-10-CM

## 2021-10-25 DIAGNOSIS — Z510 Encounter for antineoplastic radiation therapy: Secondary | ICD-10-CM | POA: Diagnosis not present

## 2021-10-25 LAB — CMP (CANCER CENTER ONLY)
ALT: 53 U/L — ABNORMAL HIGH (ref 0–44)
AST: 34 U/L (ref 15–41)
Albumin: 4.3 g/dL (ref 3.5–5.0)
Alkaline Phosphatase: 60 U/L (ref 38–126)
Anion gap: 5 (ref 5–15)
BUN: 10 mg/dL (ref 6–20)
CO2: 29 mmol/L (ref 22–32)
Calcium: 9.6 mg/dL (ref 8.9–10.3)
Chloride: 106 mmol/L (ref 98–111)
Creatinine: 1.1 mg/dL (ref 0.61–1.24)
GFR, Estimated: >60 mL/min (ref >60)
Glucose, Bld: 119 mg/dL — ABNORMAL HIGH (ref 70–99)
Potassium: 4.3 mmol/L (ref 3.5–5.1)
Sodium: 140 mmol/L (ref 135–145)
Total Bilirubin: 0.5 mg/dL (ref 0.3–1.2)
Total Protein: 6.9 g/dL (ref 6.5–8.1)

## 2021-10-25 LAB — CBC WITH DIFFERENTIAL (CANCER CENTER ONLY)
Abs Immature Granulocytes: 0.03 10*3/uL (ref 0.00–0.07)
Basophils Absolute: 0 10*3/uL (ref 0.0–0.1)
Basophils Relative: 1 %
Eosinophils Absolute: 0.2 10*3/uL (ref 0.0–0.5)
Eosinophils Relative: 8 %
HCT: 38.8 % — ABNORMAL LOW (ref 39.0–52.0)
Hemoglobin: 13.3 g/dL (ref 13.0–17.0)
Immature Granulocytes: 1 %
Lymphocytes Relative: 25 %
Lymphs Abs: 0.8 10*3/uL (ref 0.7–4.0)
MCH: 31.7 pg (ref 26.0–34.0)
MCHC: 34.3 g/dL (ref 30.0–36.0)
MCV: 92.6 fL (ref 80.0–100.0)
Monocytes Absolute: 0.7 10*3/uL (ref 0.1–1.0)
Monocytes Relative: 23 %
Neutro Abs: 1.4 10*3/uL — ABNORMAL LOW (ref 1.7–7.7)
Neutrophils Relative %: 42 %
Platelet Count: 173 10*3/uL (ref 150–400)
RBC: 4.19 MIL/uL — ABNORMAL LOW (ref 4.22–5.81)
RDW: 21.6 % — ABNORMAL HIGH (ref 11.5–15.5)
WBC Count: 3.2 10*3/uL — ABNORMAL LOW (ref 4.0–10.5)
nRBC: 0 % (ref 0.0–0.2)

## 2021-10-25 LAB — RAD ONC ARIA SESSION SUMMARY
Course Elapsed Days: 16
Plan Fractions Treated to Date: 12
Plan Prescribed Dose Per Fraction: 1.8 Gy
Plan Total Fractions Prescribed: 25
Plan Total Prescribed Dose: 45 Gy
Reference Point Dosage Given to Date: 21.6 Gy
Reference Point Session Dosage Given: 1.8 Gy
Session Number: 12

## 2021-10-26 ENCOUNTER — Other Ambulatory Visit: Payer: Self-pay

## 2021-10-26 ENCOUNTER — Ambulatory Visit
Admission: RE | Admit: 2021-10-26 | Discharge: 2021-10-26 | Disposition: A | Discharge status: Home / Self Care | Payer: BC Managed Care – PPO | Source: Ambulatory Visit | Attending: Radiation Oncology | Admitting: Radiation Oncology

## 2021-10-26 DIAGNOSIS — Z510 Encounter for antineoplastic radiation therapy: Secondary | ICD-10-CM | POA: Diagnosis present

## 2021-10-26 DIAGNOSIS — D509 Iron deficiency anemia, unspecified: Secondary | ICD-10-CM | POA: Diagnosis not present

## 2021-10-26 DIAGNOSIS — C20 Malignant neoplasm of rectum: Secondary | ICD-10-CM | POA: Insufficient documentation

## 2021-10-26 LAB — RAD ONC ARIA SESSION SUMMARY
Course Elapsed Days: 17
Plan Fractions Treated to Date: 13
Plan Prescribed Dose Per Fraction: 1.8 Gy
Plan Total Fractions Prescribed: 25
Plan Total Prescribed Dose: 45 Gy
Reference Point Dosage Given to Date: 23.4 Gy
Reference Point Session Dosage Given: 1.8 Gy
Session Number: 13

## 2021-10-27 ENCOUNTER — Other Ambulatory Visit: Payer: Self-pay

## 2021-10-27 ENCOUNTER — Ambulatory Visit
Admission: RE | Admit: 2021-10-27 | Discharge: 2021-10-27 | Disposition: A | Discharge status: Home / Self Care | Payer: BC Managed Care – PPO | Source: Ambulatory Visit | Attending: Radiation Oncology | Admitting: Radiation Oncology

## 2021-10-27 DIAGNOSIS — Z510 Encounter for antineoplastic radiation therapy: Secondary | ICD-10-CM | POA: Diagnosis not present

## 2021-10-27 LAB — RAD ONC ARIA SESSION SUMMARY
Course Elapsed Days: 18
Plan Fractions Treated to Date: 14
Plan Prescribed Dose Per Fraction: 1.8 Gy
Plan Total Fractions Prescribed: 25
Plan Total Prescribed Dose: 45 Gy
Reference Point Dosage Given to Date: 25.2 Gy
Reference Point Session Dosage Given: 1.8 Gy
Session Number: 14

## 2021-10-30 ENCOUNTER — Ambulatory Visit
Admission: RE | Admit: 2021-10-30 | Discharge: 2021-10-30 | Disposition: A | Discharge status: Home / Self Care | Payer: BC Managed Care – PPO | Source: Ambulatory Visit | Attending: Radiation Oncology | Admitting: Radiation Oncology

## 2021-10-30 ENCOUNTER — Other Ambulatory Visit: Payer: Self-pay

## 2021-10-30 DIAGNOSIS — Z510 Encounter for antineoplastic radiation therapy: Secondary | ICD-10-CM | POA: Diagnosis not present

## 2021-10-30 LAB — RAD ONC ARIA SESSION SUMMARY
Course Elapsed Days: 21
Plan Fractions Treated to Date: 15
Plan Prescribed Dose Per Fraction: 1.8 Gy
Plan Total Fractions Prescribed: 25
Plan Total Prescribed Dose: 45 Gy
Reference Point Dosage Given to Date: 27 Gy
Reference Point Session Dosage Given: 1.8 Gy
Session Number: 15

## 2021-10-31 ENCOUNTER — Inpatient Hospital Stay: Payer: BC Managed Care – PPO

## 2021-10-31 ENCOUNTER — Other Ambulatory Visit: Payer: Self-pay

## 2021-10-31 ENCOUNTER — Ambulatory Visit
Admission: RE | Admit: 2021-10-31 | Discharge: 2021-10-31 | Disposition: A | Discharge status: Home / Self Care | Payer: BC Managed Care – PPO | Source: Ambulatory Visit | Attending: Radiation Oncology | Admitting: Radiation Oncology

## 2021-10-31 DIAGNOSIS — Z79899 Other long term (current) drug therapy: Secondary | ICD-10-CM | POA: Insufficient documentation

## 2021-10-31 DIAGNOSIS — C20 Malignant neoplasm of rectum: Secondary | ICD-10-CM | POA: Insufficient documentation

## 2021-10-31 DIAGNOSIS — D500 Iron deficiency anemia secondary to blood loss (chronic): Secondary | ICD-10-CM

## 2021-10-31 DIAGNOSIS — D509 Iron deficiency anemia, unspecified: Secondary | ICD-10-CM | POA: Insufficient documentation

## 2021-10-31 DIAGNOSIS — Z510 Encounter for antineoplastic radiation therapy: Secondary | ICD-10-CM | POA: Diagnosis not present

## 2021-10-31 DIAGNOSIS — D630 Anemia in neoplastic disease: Secondary | ICD-10-CM

## 2021-10-31 LAB — CBC WITH DIFFERENTIAL (CANCER CENTER ONLY)
Abs Immature Granulocytes: 0.02 10*3/uL (ref 0.00–0.07)
Basophils Absolute: 0 10*3/uL (ref 0.0–0.1)
Basophils Relative: 1 %
Eosinophils Absolute: 0.2 10*3/uL (ref 0.0–0.5)
Eosinophils Relative: 5 %
HCT: 36.9 % — ABNORMAL LOW (ref 39.0–52.0)
Hemoglobin: 12.7 g/dL — ABNORMAL LOW (ref 13.0–17.0)
Immature Granulocytes: 1 %
Lymphocytes Relative: 27 %
Lymphs Abs: 1 10*3/uL (ref 0.7–4.0)
MCH: 32.2 pg (ref 26.0–34.0)
MCHC: 34.4 g/dL (ref 30.0–36.0)
MCV: 93.7 fL (ref 80.0–100.0)
Monocytes Absolute: 1 10*3/uL (ref 0.1–1.0)
Monocytes Relative: 25 %
Neutro Abs: 1.6 10*3/uL — ABNORMAL LOW (ref 1.7–7.7)
Neutrophils Relative %: 41 %
Platelet Count: 159 10*3/uL (ref 150–400)
RBC: 3.94 MIL/uL — ABNORMAL LOW (ref 4.22–5.81)
RDW: 21.5 % — ABNORMAL HIGH (ref 11.5–15.5)
WBC Count: 3.9 10*3/uL — ABNORMAL LOW (ref 4.0–10.5)
nRBC: 0 % (ref 0.0–0.2)

## 2021-10-31 LAB — CMP (CANCER CENTER ONLY)
ALT: 40 U/L (ref 0–44)
AST: 30 U/L (ref 15–41)
Albumin: 4.4 g/dL (ref 3.5–5.0)
Alkaline Phosphatase: 65 U/L (ref 38–126)
Anion gap: 3 — ABNORMAL LOW (ref 5–15)
BUN: 11 mg/dL (ref 6–20)
CO2: 29 mmol/L (ref 22–32)
Calcium: 9.9 mg/dL (ref 8.9–10.3)
Chloride: 105 mmol/L (ref 98–111)
Creatinine: 1.1 mg/dL (ref 0.61–1.24)
GFR, Estimated: >60 mL/min (ref >60)
Glucose, Bld: 90 mg/dL (ref 70–99)
Potassium: 4.3 mmol/L (ref 3.5–5.1)
Sodium: 137 mmol/L (ref 135–145)
Total Bilirubin: 0.5 mg/dL (ref 0.3–1.2)
Total Protein: 7.1 g/dL (ref 6.5–8.1)

## 2021-10-31 LAB — RAD ONC ARIA SESSION SUMMARY
Course Elapsed Days: 22
Plan Fractions Treated to Date: 16
Plan Prescribed Dose Per Fraction: 1.8 Gy
Plan Total Fractions Prescribed: 25
Plan Total Prescribed Dose: 45 Gy
Reference Point Dosage Given to Date: 28.8 Gy
Reference Point Session Dosage Given: 1.8 Gy
Session Number: 16

## 2021-10-31 LAB — IRON AND IRON BINDING CAPACITY (CC-WL,HP ONLY)
Iron: 143 ug/dL (ref 45–182)
Saturation Ratios: 31 % (ref 17.9–39.5)
TIBC: 468 ug/dL — ABNORMAL HIGH (ref 250–450)
UIBC: 325 ug/dL (ref 117–376)

## 2021-10-31 LAB — FERRITIN: Ferritin: 42 ng/mL (ref 24–336)

## 2021-11-01 ENCOUNTER — Other Ambulatory Visit: Payer: Self-pay

## 2021-11-01 ENCOUNTER — Ambulatory Visit
Admission: RE | Admit: 2021-11-01 | Discharge: 2021-11-01 | Disposition: A | Discharge status: Home / Self Care | Payer: BC Managed Care – PPO | Source: Ambulatory Visit | Attending: Radiation Oncology | Admitting: Radiation Oncology

## 2021-11-01 DIAGNOSIS — Z510 Encounter for antineoplastic radiation therapy: Secondary | ICD-10-CM | POA: Diagnosis not present

## 2021-11-01 LAB — RAD ONC ARIA SESSION SUMMARY
Course Elapsed Days: 23
Plan Fractions Treated to Date: 17
Plan Prescribed Dose Per Fraction: 1.8 Gy
Plan Total Fractions Prescribed: 25
Plan Total Prescribed Dose: 45 Gy
Reference Point Dosage Given to Date: 30.6 Gy
Reference Point Session Dosage Given: 1.8 Gy
Session Number: 17

## 2021-11-02 ENCOUNTER — Ambulatory Visit
Admission: RE | Admit: 2021-11-02 | Discharge: 2021-11-02 | Disposition: A | Discharge status: Home / Self Care | Payer: BC Managed Care – PPO | Source: Ambulatory Visit | Attending: Radiation Oncology | Admitting: Radiation Oncology

## 2021-11-02 ENCOUNTER — Other Ambulatory Visit: Payer: Self-pay

## 2021-11-02 DIAGNOSIS — Z510 Encounter for antineoplastic radiation therapy: Secondary | ICD-10-CM | POA: Diagnosis not present

## 2021-11-02 LAB — RAD ONC ARIA SESSION SUMMARY
Course Elapsed Days: 24
Plan Fractions Treated to Date: 18
Plan Prescribed Dose Per Fraction: 1.8 Gy
Plan Total Fractions Prescribed: 25
Plan Total Prescribed Dose: 45 Gy
Reference Point Dosage Given to Date: 32.4 Gy
Reference Point Session Dosage Given: 1.8 Gy
Session Number: 18

## 2021-11-03 ENCOUNTER — Other Ambulatory Visit: Payer: Self-pay

## 2021-11-03 ENCOUNTER — Ambulatory Visit
Admission: RE | Admit: 2021-11-03 | Discharge: 2021-11-03 | Disposition: A | Discharge status: Home / Self Care | Payer: BC Managed Care – PPO | Source: Ambulatory Visit | Attending: Radiation Oncology | Admitting: Radiation Oncology

## 2021-11-03 DIAGNOSIS — Z510 Encounter for antineoplastic radiation therapy: Secondary | ICD-10-CM | POA: Diagnosis not present

## 2021-11-03 LAB — RAD ONC ARIA SESSION SUMMARY
Course Elapsed Days: 25
Plan Fractions Treated to Date: 19
Plan Prescribed Dose Per Fraction: 1.8 Gy
Plan Total Fractions Prescribed: 25
Plan Total Prescribed Dose: 45 Gy
Reference Point Dosage Given to Date: 34.2 Gy
Reference Point Session Dosage Given: 1.8 Gy
Session Number: 19

## 2021-11-06 ENCOUNTER — Other Ambulatory Visit: Payer: Self-pay

## 2021-11-06 ENCOUNTER — Ambulatory Visit
Admission: RE | Admit: 2021-11-06 | Discharge: 2021-11-06 | Disposition: A | Discharge status: Home / Self Care | Payer: BC Managed Care – PPO | Source: Ambulatory Visit | Attending: Radiation Oncology | Admitting: Radiation Oncology

## 2021-11-06 DIAGNOSIS — Z510 Encounter for antineoplastic radiation therapy: Secondary | ICD-10-CM | POA: Diagnosis not present

## 2021-11-06 LAB — RAD ONC ARIA SESSION SUMMARY
Course Elapsed Days: 28
Plan Fractions Treated to Date: 20
Plan Prescribed Dose Per Fraction: 1.8 Gy
Plan Total Fractions Prescribed: 25
Plan Total Prescribed Dose: 45 Gy
Reference Point Dosage Given to Date: 36 Gy
Reference Point Session Dosage Given: 1.8 Gy
Session Number: 20

## 2021-11-07 ENCOUNTER — Ambulatory Visit
Admission: RE | Admit: 2021-11-07 | Discharge: 2021-11-07 | Disposition: A | Discharge status: Home / Self Care | Payer: BC Managed Care – PPO | Source: Ambulatory Visit | Attending: Radiation Oncology | Admitting: Radiation Oncology

## 2021-11-07 ENCOUNTER — Other Ambulatory Visit: Payer: Self-pay

## 2021-11-07 DIAGNOSIS — Z510 Encounter for antineoplastic radiation therapy: Secondary | ICD-10-CM | POA: Diagnosis not present

## 2021-11-07 LAB — RAD ONC ARIA SESSION SUMMARY
Course Elapsed Days: 29
Plan Fractions Treated to Date: 21
Plan Prescribed Dose Per Fraction: 1.8 Gy
Plan Total Fractions Prescribed: 25
Plan Total Prescribed Dose: 45 Gy
Reference Point Dosage Given to Date: 37.8 Gy
Reference Point Session Dosage Given: 1.8 Gy
Session Number: 21

## 2021-11-08 ENCOUNTER — Other Ambulatory Visit: Payer: Self-pay

## 2021-11-08 ENCOUNTER — Ambulatory Visit
Admission: RE | Admit: 2021-11-08 | Discharge: 2021-11-08 | Disposition: A | Discharge status: Home / Self Care | Payer: BC Managed Care – PPO | Source: Ambulatory Visit | Attending: Radiation Oncology | Admitting: Radiation Oncology

## 2021-11-08 DIAGNOSIS — Z510 Encounter for antineoplastic radiation therapy: Secondary | ICD-10-CM | POA: Diagnosis not present

## 2021-11-08 LAB — RAD ONC ARIA SESSION SUMMARY
Course Elapsed Days: 30
Plan Fractions Treated to Date: 22
Plan Prescribed Dose Per Fraction: 1.8 Gy
Plan Total Fractions Prescribed: 25
Plan Total Prescribed Dose: 45 Gy
Reference Point Dosage Given to Date: 39.6 Gy
Reference Point Session Dosage Given: 1.8 Gy
Session Number: 22

## 2021-11-09 ENCOUNTER — Other Ambulatory Visit: Payer: Self-pay

## 2021-11-09 ENCOUNTER — Encounter: Payer: Self-pay | Admitting: Hematology

## 2021-11-09 ENCOUNTER — Ambulatory Visit
Admission: RE | Admit: 2021-11-09 | Discharge: 2021-11-09 | Disposition: A | Discharge status: Home / Self Care | Payer: BC Managed Care – PPO | Source: Ambulatory Visit | Attending: Radiation Oncology | Admitting: Radiation Oncology

## 2021-11-09 ENCOUNTER — Inpatient Hospital Stay: Payer: BC Managed Care – PPO

## 2021-11-09 ENCOUNTER — Inpatient Hospital Stay (HOSPITAL_BASED_OUTPATIENT_CLINIC_OR_DEPARTMENT_OTHER): Payer: BC Managed Care – PPO | Admitting: Hematology

## 2021-11-09 VITALS — BP 132/87 | HR 84 | Temp 98.8°F | Resp 18 | Ht 72.0 in | Wt 190.2 lb

## 2021-11-09 DIAGNOSIS — C20 Malignant neoplasm of rectum: Secondary | ICD-10-CM | POA: Diagnosis not present

## 2021-11-09 DIAGNOSIS — Z510 Encounter for antineoplastic radiation therapy: Secondary | ICD-10-CM | POA: Diagnosis not present

## 2021-11-09 LAB — CBC WITH DIFFERENTIAL (CANCER CENTER ONLY)
Abs Immature Granulocytes: 0.01 10*3/uL (ref 0.00–0.07)
Basophils Absolute: 0 10*3/uL (ref 0.0–0.1)
Basophils Relative: 0 %
Eosinophils Absolute: 0.2 10*3/uL (ref 0.0–0.5)
Eosinophils Relative: 7 %
HCT: 37.2 % — ABNORMAL LOW (ref 39.0–52.0)
Hemoglobin: 13 g/dL (ref 13.0–17.0)
Immature Granulocytes: 0 %
Lymphocytes Relative: 18 %
Lymphs Abs: 0.5 10*3/uL — ABNORMAL LOW (ref 0.7–4.0)
MCH: 32.9 pg (ref 26.0–34.0)
MCHC: 34.9 g/dL (ref 30.0–36.0)
MCV: 94.2 fL (ref 80.0–100.0)
Monocytes Absolute: 0.6 10*3/uL (ref 0.1–1.0)
Monocytes Relative: 23 %
Neutro Abs: 1.3 10*3/uL — ABNORMAL LOW (ref 1.7–7.7)
Neutrophils Relative %: 52 %
Platelet Count: 208 10*3/uL (ref 150–400)
RBC: 3.95 MIL/uL — ABNORMAL LOW (ref 4.22–5.81)
RDW: 21.2 % — ABNORMAL HIGH (ref 11.5–15.5)
WBC Count: 2.6 10*3/uL — ABNORMAL LOW (ref 4.0–10.5)
nRBC: 0 % (ref 0.0–0.2)

## 2021-11-09 LAB — CMP (CANCER CENTER ONLY)
ALT: 30 U/L (ref 0–44)
AST: 29 U/L (ref 15–41)
Albumin: 4.1 g/dL (ref 3.5–5.0)
Alkaline Phosphatase: 63 U/L (ref 38–126)
Anion gap: 4 — ABNORMAL LOW (ref 5–15)
BUN: 13 mg/dL (ref 6–20)
CO2: 28 mmol/L (ref 22–32)
Calcium: 9.5 mg/dL (ref 8.9–10.3)
Chloride: 107 mmol/L (ref 98–111)
Creatinine: 1.1 mg/dL (ref 0.61–1.24)
GFR, Estimated: >60 mL/min (ref >60)
Glucose, Bld: 114 mg/dL — ABNORMAL HIGH (ref 70–99)
Potassium: 3.9 mmol/L (ref 3.5–5.1)
Sodium: 139 mmol/L (ref 135–145)
Total Bilirubin: 0.7 mg/dL (ref 0.3–1.2)
Total Protein: 6.9 g/dL (ref 6.5–8.1)

## 2021-11-09 LAB — RAD ONC ARIA SESSION SUMMARY
Course Elapsed Days: 31
Plan Fractions Treated to Date: 23
Plan Prescribed Dose Per Fraction: 1.8 Gy
Plan Total Fractions Prescribed: 25
Plan Total Prescribed Dose: 45 Gy
Reference Point Dosage Given to Date: 41.4 Gy
Reference Point Session Dosage Given: 1.8 Gy
Session Number: 23

## 2021-11-09 NOTE — Progress Notes (Addendum)
[place]   Telephone:([phone] Fax:(336) [phone]   Clinic Follow up Note   Patient Care Team: Derinda Late, MD as PCP - General (Family Medicine) [name], MD as Consulting Physician (Gastroenterology) [name], MD as Consulting Physician (Hematology) Nadeen Landau (Inactive) Kyung Rudd, MD as Consulting Physician (Radiation Oncology) [name], RN as Oncology Nurse Navigator (Oncology)  Date of Service:  11/09/2021  CHIEF COMPLAINT: f/u of rectal cancer  CURRENT THERAPY:  Concurrent chemoRT with Xeloda, starting 10/09/21             -Xeloda dose: 1533m BID  ASSESSMENT & PLAN:  Jerry Blankenshipis a 46y.o. male with   1. 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 He was referred to Dr. CCandis Schatzand proceeded to colonoscopy on 03/07/21 showing a partially-obstructing tumor in proximal rectum. Biopsy confirmed adenocarcinoma and a tubular adenoma polyp. -baseline CEA that day was normal at 1.7. -staging CT CAP 03/09/21 was negative for metastatic disease. -pelvic MRI 03/27/21 was also negative for nodal metastasis but was not able to give T stage  -he underwent EUS on 04/27/21 with Dr. JArdis Hughsshowing: 4 cm rectal adenocarcinoma invading into and through the muscularis propria layer of rectal wall; 1.5 cm satellite nodule located 5-8 mm distal to tumor. This is staged as T3 -he began CAPOX on 05/10/21. He tolerated first cycle poorly with a lot of vomiting and fatigue. He tolerated cycle 2 better with Emend and reduced oxali dose. We held cycle 3 due to worsening transaminitis. Abdomen UKoreaon 06/29/21 was negative for metastatic disease, showed only mild steatosis. He completed 6 cycles on 09/21/21. -he began concurrent chemoRT with Xeloda on 10/09/21. He is tolerating well overall but now experiencing side effects, including urinary frequency/urgency, diarrhea, rectal irritation,  skin toxicities. He is using imodium with good results. -he was seen by Dr. WDema Severinon 11/06/21 to discuss surgery. He is interested in a discussion/second surgical opinion with Dr. WMorton Stall which I am happy to facilitate. He would like to avoid colostomy, even temporarily, if at all possible. I encouraged him to have surgery, but discussed watchful wait approach also. If for some reason decides not to do surgery, we will repeat endoscopy and MRI in a few months. -labs reviewed, overall stable on treatment. Plan for post-treatment CT in a month.    2. Iron deficient anemia -work up on 03/29/21 showed: iron 23, ferritin 4, hgb 11.1.  This is secondary to rectal bleeding from the tumor. -he received IV iron on 05/15/21. He reports he had terrible constipation following the infusion. -I recommend he take oral iron or prenatal vitamin. -ferritin 42 on 10/31/21.     PLAN:  -continue chemoRT through 11/15/21 -referral to [facility] Dr. WMorton Stallfor second surgical opinion  -f/u in 1 month, with lab and CTCAP w contrast several days before   No problem-specific Assessment & Plan notes found for this encounter.   SUMMARY OF ONCOLOGIC HISTORY: Oncology History  [place]  03/07/2021 Procedure   Colonoscopy, Dr. CCandis Schatz Impression - Malignant appearing partially obstructing tumor in the proximal rectum. Biopsied. Tattooed. - One 3 mm polyp in the distal rectum, removed with a cold snare. Resected and retrieved. - The examined portion of the ileum was normal. - The distal rectum and anal verge are normal on retroflexion view.   03/07/2021 Pathology Results   Diagnosis 1. Rectum, biopsy, mass - ADENOCARCINOMA - SEE COMMENT 2.  Rectum, biopsy, polyp (1) - TUBULAR ADENOMA (1 OF 1 FRAGMENTS) - NO HIGH-GRADE DYSPLASIA OR MALIGNANCY IDENTIFIED   03/09/2021 Imaging   CT CAP  IMPRESSION: 1. Asymmetric wall thickening in the proximal/mid rectum may correspond to mass seen on  endoscopy. 2. Negative for adenopathy or evidence of distal metastatic disease.   03/27/2021 Imaging   MRI Pelvis  IMPRESSION: 1. Low sigmoid primary, greater than 15 cm from the anal verge, as detailed above. Poorly delineated secondary to underdistention in this region. 2. No evidence of pelvic nodal metastasis. 3. Trace perisigmoid fluid, new since the prior CT.   03/28/2021 Initial Diagnosis   Adenocarcinoma of rectum ([place])   03/29/2021 Cancer Staging   Staging form: Colon and Rectum - Neuroendocine Tumors, AJCC 8th Edition - Clinical stage from 03/29/2021: Stage IIB (cT3, cN0, cM0) - Signed by [name], MD on 06/20/2021 Stage prefix: Initial diagnosis Histologic grade (G): G2 Histologic grading system: 3 grade system   04/12/2021 Genetic Testing   Negative genetic testing on the CancerNext-Expanded+RNAinsight panel.  The report date is 04/12/2021.  The CancerNext-Expanded gene panel offered by [facility] and includes sequencing and rearrangement analysis for the following 77 genes: AIP, ALK, APC*, ATM*, AXIN2, BAP1, BARD1, BLM, BMPR1A, BRCA1*, BRCA2*, BRIP1*, CDC73, CDH1*, CDK4, CDKN1B, CDKN2A, CHEK2*, CTNNA1, DICER1, FANCC, FH, FLCN, GALNT12, KIF1B, LZTR1, MAX, MEN1, MET, MLH1*, MSH2*, MSH3, MSH6*, MUTYH*, NBN, NF1*, NF2, NTHL1, PALB2*, PHOX2B, PMS2*, POT1, PRKAR1A, PTCH1, PTEN*, RAD51C*, RAD51D*, RB1, RECQL, RET, SDHA, SDHAF2, SDHB, SDHC, SDHD, SMAD4, SMARCA4, SMARCB1, SMARCE1, STK11, SUFU, TMEM127, TP53*, TSC1, TSC2, VHL and XRCC2 (sequencing and deletion/duplication); EGFR, EGLN1, HOXB13, KIT, MITF, PDGFRA, POLD1, and POLE (sequencing only); EPCAM and GREM1 (deletion/duplication only). DNA and RNA analyses performed for * genes.    04/27/2021 Procedure   EUS, Dr. Ardis Hughs  Impression: - 4cm long, circumferential, at least uT3Nx rectal adenocarcinoma with distal edge located 10cm from the anal verge. 1.5cm satellite nodule located 5-48m distal to the tumor (around 9cm from the  anal verge).   05/10/2021 -  Chemotherapy   Patient is on Treatment Plan : RECTAL Xelox (Capeox) q21d x 6 cycles        INTERVAL HISTORY:  Jerry Okaneis here for a follow up of rectal cancer. He was last seen by NP Lacie on 10/25/21. He presents to the clinic alone. He reports he has started to develop side effects from the radiation-- some urinary frequency, diarrhea, rectal irritation. He denies bleeding. He endorses taking one imodium but notes this can make his stool hard. He reports skin changes from Xeloda, specifically sensitivity and skin cracking to his feet moreso. He notes he has not needed the marinol since completing the IV chemo.   All other systems were reviewed with the patient and are negative.  MEDICAL HISTORY:  Past Medical History:  Diagnosis Date   Anxiety    Family history of bladder cancer    [place]al cancer ([place] 02/[place]    SURGICAL HISTORY: Past Surgical History:  Procedure Laterality Date   EUS N/A 04/27/2021   Procedure: LOWER ENDOSCOPIC ULTRASOUND (EUS);  Surgeon: JMilus Banister MD;  [facility];  Service: Endoscopy;  Laterality: N/A;   FLEXIBLE SIGMOIDOSCOPY N/A 04/27/2021   Procedure: FLEXIBLE SIGMOIDOSCOPY;  Surgeon: JMilus Banister MD;  [facility];  Service: Endoscopy;  Laterality: N/A;   NO PAST SURGERIES      I have reviewed the social history and family history with the patient and they are unchanged from previous  note.  ALLERGIES:  is allergic to shellfish allergy.  MEDICATIONS:  Current Outpatient Medications  Medication Sig Dispense Refill   capecitabine (XELODA) 500 MG tablet Take 3 tabs every 12 hours. Take after meals. Take on days of radiation, Monday through Friday 120 tablet 0   dicyclomine (BENTYL) 20 MG tablet Take 1 tablet (20 mg total) by mouth 3 (three) times daily as needed for spasms. 60 tablet 1   escitalopram (LEXAPRO) 10 MG tablet Take 5 mg by mouth daily.     omeprazole (PRILOSEC) 20 MG  capsule Take 1 capsule (20 mg total) by mouth daily. 30 capsule 0   ondansetron (ZOFRAN) 8 MG tablet Take 1 tablet (8 mg total) by mouth 2 (two) times daily as needed for refractory nausea / vomiting. Start on day 3 after chemotherapy. 30 tablet 1   prochlorperazine (COMPAZINE) 10 MG tablet Take 1 tablet (10 mg total) by mouth every 6 (six) hours as needed (Nausea or vomiting). 30 tablet 1   No current facility-administered medications for this visit.    PHYSICAL EXAMINATION: ECOG PERFORMANCE STATUS: 1 - Symptomatic but completely ambulatory  Vitals:   11/09/21 0950  BP: 132/87  Pulse: 84  Resp: 18  Temp: 98.8 F (37.1 C)  SpO2: 99%   Wt Readings from Last 3 Encounters:  11/09/21 190 lb 3.2 oz (86.3 kg)  10/25/21 188 lb 3.2 oz (85.4 kg)  10/09/21 186 lb 1.6 oz (84.4 kg)     GENERAL:alert, no distress and comfortable SKIN: skin color normal, no rashes or significant lesions EYES: normal, Conjunctiva are pink and non-injected, sclera clear  NEURO: alert & oriented x 3 with fluent speech  LABORATORY DATA:  I have reviewed the data as listed    Latest Ref Rng & Units 11/09/2021    9:32 AM 10/31/2021   12:01 PM 10/25/2021    8:50 AM  CBC  WBC 4.0 - 10.5 K/uL 2.6  3.9  3.2   Hemoglobin 13.0 - 17.0 g/dL 13.0  12.7  13.3   Hematocrit 39.0 - 52.0 % 37.2  36.9  38.8   Platelets 150 - 400 K/uL 208  159  173         Latest Ref Rng & Units 11/09/2021    9:32 AM 10/31/2021   12:01 PM 10/25/2021    8:50 AM  CMP  Glucose 70 - 99 mg/dL 114  90  119   BUN 6 - 20 mg/dL 13  11  10    Creatinine 0.61 - 1.24 mg/dL 1.10  1.10  1.10   Sodium 135 - 145 mmol/L 139  137  140   Potassium 3.5 - 5.1 mmol/L 3.9  4.3  4.3   Chloride 98 - 111 mmol/L 107  105  106   CO2 22 - 32 mmol/L 28  29  29    Calcium 8.9 - 10.3 mg/dL 9.5  9.9  9.6   Total Protein 6.5 - 8.1 g/dL 6.9  7.1  6.9   Total Bilirubin 0.3 - 1.2 mg/dL 0.7  0.5  0.5   Alkaline Phos 38 - 126 U/L 63  65  60   AST 15 - 41 U/L 29  30  34    ALT 0 - 44 U/L 30  40  53       RADIOGRAPHIC STUDIES: I have personally reviewed the radiological images as listed and agreed with the findings in the report. No results found.    Orders Placed This Encounter  Procedures  Ambulatory referral to General Surgery    Referral Priority:   Routine    Referral Type:   Surgical    Referral Reason:   Specialty Services Required    Requested Specialty:   General Surgery    Number of Visits Requested:   1   All questions were answered. The patient knows to call the clinic with any problems, questions or concerns. No barriers to learning was detected. The total time spent in the appointment was 30 minutes.     [name], MD 11/09/2021   I, Wilburn Mylar, am acting as scribe for [name], MD.   I have reviewed the above documentation for accuracy and completeness, and I agree with the above.

## 2021-11-10 ENCOUNTER — Other Ambulatory Visit (HOSPITAL_COMMUNITY): Payer: Self-pay | Admitting: Surgery

## 2021-11-10 ENCOUNTER — Ambulatory Visit
Admission: RE | Admit: 2021-11-10 | Discharge: 2021-11-10 | Disposition: A | Discharge status: Home / Self Care | Payer: BC Managed Care – PPO | Source: Ambulatory Visit | Attending: Radiation Oncology | Admitting: Radiation Oncology

## 2021-11-10 ENCOUNTER — Other Ambulatory Visit: Payer: Self-pay

## 2021-11-10 ENCOUNTER — Other Ambulatory Visit: Payer: Self-pay | Admitting: Surgery

## 2021-11-10 DIAGNOSIS — Z510 Encounter for antineoplastic radiation therapy: Secondary | ICD-10-CM | POA: Diagnosis not present

## 2021-11-10 DIAGNOSIS — C20 Malignant neoplasm of rectum: Secondary | ICD-10-CM

## 2021-11-10 LAB — RAD ONC ARIA SESSION SUMMARY
Course Elapsed Days: 32
Plan Fractions Treated to Date: 24
Plan Prescribed Dose Per Fraction: 1.8 Gy
Plan Total Fractions Prescribed: 25
Plan Total Prescribed Dose: 45 Gy
Reference Point Dosage Given to Date: 43.2 Gy
Reference Point Session Dosage Given: 1.8 Gy
Session Number: 24

## 2021-11-13 ENCOUNTER — Other Ambulatory Visit: Payer: Self-pay

## 2021-11-13 ENCOUNTER — Ambulatory Visit
Admission: RE | Admit: 2021-11-13 | Discharge: 2021-11-13 | Disposition: A | Discharge status: Home / Self Care | Payer: BC Managed Care – PPO | Source: Ambulatory Visit | Attending: Radiation Oncology | Admitting: Radiation Oncology

## 2021-11-13 DIAGNOSIS — Z510 Encounter for antineoplastic radiation therapy: Secondary | ICD-10-CM | POA: Diagnosis not present

## 2021-11-13 LAB — RAD ONC ARIA SESSION SUMMARY
Course Elapsed Days: 35
Plan Fractions Treated to Date: 25
Plan Prescribed Dose Per Fraction: 1.8 Gy
Plan Total Fractions Prescribed: 25
Plan Total Prescribed Dose: 45 Gy
Reference Point Dosage Given to Date: 45 Gy
Reference Point Session Dosage Given: 1.8 Gy
Session Number: 25

## 2021-11-14 ENCOUNTER — Ambulatory Visit (HOSPITAL_COMMUNITY)
Admission: RE | Admit: 2021-11-14 | Discharge: 2021-11-14 | Disposition: A | Discharge status: Home / Self Care | Payer: BC Managed Care – PPO | Source: Ambulatory Visit | Attending: Surgery | Admitting: Surgery

## 2021-11-14 ENCOUNTER — Other Ambulatory Visit: Payer: Self-pay

## 2021-11-14 ENCOUNTER — Ambulatory Visit
Admission: RE | Admit: 2021-11-14 | Discharge: 2021-11-14 | Disposition: A | Discharge status: Home / Self Care | Payer: BC Managed Care – PPO | Source: Ambulatory Visit | Attending: Radiation Oncology | Admitting: Radiation Oncology

## 2021-11-14 DIAGNOSIS — Z510 Encounter for antineoplastic radiation therapy: Secondary | ICD-10-CM | POA: Diagnosis not present

## 2021-11-14 DIAGNOSIS — C20 Malignant neoplasm of rectum: Secondary | ICD-10-CM | POA: Diagnosis present

## 2021-11-14 LAB — RAD ONC ARIA SESSION SUMMARY
Course Elapsed Days: 36
Plan Fractions Treated to Date: 1
Plan Prescribed Dose Per Fraction: 1.8 Gy
Plan Total Fractions Prescribed: 3
Plan Total Prescribed Dose: 5.4 Gy
Reference Point Dosage Given to Date: 46.8 Gy
Reference Point Session Dosage Given: 1.8 Gy
Session Number: 26

## 2021-11-14 MED ORDER — IOHEXOL 300 MG/ML  SOLN
100.0000 mL | Freq: Once | INTRAMUSCULAR | Status: AC | PRN
  Administered 2021-11-14: 100 mL via INTRAVENOUS

## 2021-11-14 MED ORDER — SODIUM CHLORIDE (PF) 0.9 % IJ SOLN
INTRAMUSCULAR | Status: AC
  Filled 2021-11-14: qty 50

## 2021-11-15 ENCOUNTER — Ambulatory Visit
Admission: RE | Admit: 2021-11-15 | Discharge: 2021-11-15 | Disposition: A | Discharge status: Home / Self Care | Payer: BC Managed Care – PPO | Source: Ambulatory Visit | Attending: Radiation Oncology | Admitting: Radiation Oncology

## 2021-11-15 ENCOUNTER — Ambulatory Visit: Payer: BC Managed Care – PPO

## 2021-11-15 ENCOUNTER — Other Ambulatory Visit: Payer: Self-pay

## 2021-11-15 ENCOUNTER — Encounter: Payer: Self-pay | Admitting: Radiation Oncology

## 2021-11-15 DIAGNOSIS — Z510 Encounter for antineoplastic radiation therapy: Secondary | ICD-10-CM | POA: Diagnosis not present

## 2021-11-15 LAB — RAD ONC ARIA SESSION SUMMARY
Course Elapsed Days: 37
Course Elapsed Days: 37
Plan Fractions Treated to Date: 2
Plan Fractions Treated to Date: 3
Plan Prescribed Dose Per Fraction: 1.8 Gy
Plan Prescribed Dose Per Fraction: 1.8 Gy
Plan Total Fractions Prescribed: 3
Plan Total Fractions Prescribed: 3
Plan Total Prescribed Dose: 5.4 Gy
Plan Total Prescribed Dose: 5.4 Gy
Reference Point Dosage Given to Date: 48.6 Gy
Reference Point Dosage Given to Date: 50.4 Gy
Reference Point Session Dosage Given: 1.8 Gy
Reference Point Session Dosage Given: 1.8 Gy
Session Number: 27
Session Number: 28

## 2021-11-16 ENCOUNTER — Ambulatory Visit: Payer: BC Managed Care – PPO

## 2021-12-12 ENCOUNTER — Inpatient Hospital Stay: Payer: BC Managed Care – PPO | Attending: Hematology

## 2021-12-12 ENCOUNTER — Other Ambulatory Visit: Payer: Self-pay

## 2021-12-12 DIAGNOSIS — C20 Malignant neoplasm of rectum: Secondary | ICD-10-CM | POA: Diagnosis present

## 2021-12-12 DIAGNOSIS — G620 Drug-induced polyneuropathy: Secondary | ICD-10-CM | POA: Insufficient documentation

## 2021-12-12 DIAGNOSIS — D509 Iron deficiency anemia, unspecified: Secondary | ICD-10-CM | POA: Insufficient documentation

## 2021-12-12 DIAGNOSIS — Z79899 Other long term (current) drug therapy: Secondary | ICD-10-CM | POA: Insufficient documentation

## 2021-12-12 DIAGNOSIS — T451X5A Adverse effect of antineoplastic and immunosuppressive drugs, initial encounter: Secondary | ICD-10-CM | POA: Insufficient documentation

## 2021-12-12 DIAGNOSIS — D500 Iron deficiency anemia secondary to blood loss (chronic): Secondary | ICD-10-CM

## 2021-12-12 LAB — CBC WITH DIFFERENTIAL (CANCER CENTER ONLY)
Abs Immature Granulocytes: 0.01 10*3/uL (ref 0.00–0.07)
Basophils Absolute: 0 10*3/uL (ref 0.0–0.1)
Basophils Relative: 1 %
Eosinophils Absolute: 0.3 10*3/uL (ref 0.0–0.5)
Eosinophils Relative: 7 %
HCT: 39.8 % (ref 39.0–52.0)
Hemoglobin: 14 g/dL (ref 13.0–17.0)
Immature Granulocytes: 0 %
Lymphocytes Relative: 26 %
Lymphs Abs: 1.1 10*3/uL (ref 0.7–4.0)
MCH: 33.8 pg (ref 26.0–34.0)
MCHC: 35.2 g/dL (ref 30.0–36.0)
MCV: 96.1 fL (ref 80.0–100.0)
Monocytes Absolute: 0.8 10*3/uL (ref 0.1–1.0)
Monocytes Relative: 19 %
Neutro Abs: 2 10*3/uL (ref 1.7–7.7)
Neutrophils Relative %: 47 %
Platelet Count: 247 10*3/uL (ref 150–400)
RBC: 4.14 MIL/uL — ABNORMAL LOW (ref 4.22–5.81)
RDW: 15.2 % (ref 11.5–15.5)
WBC Count: 4.1 10*3/uL (ref 4.0–10.5)
nRBC: 0 % (ref 0.0–0.2)

## 2021-12-12 LAB — CMP (CANCER CENTER ONLY)
ALT: 30 U/L (ref 0–44)
AST: 29 U/L (ref 15–41)
Albumin: 4.4 g/dL (ref 3.5–5.0)
Alkaline Phosphatase: 62 U/L (ref 38–126)
Anion gap: 3 — ABNORMAL LOW (ref 5–15)
BUN: 12 mg/dL (ref 6–20)
CO2: 30 mmol/L (ref 22–32)
Calcium: 9.8 mg/dL (ref 8.9–10.3)
Chloride: 104 mmol/L (ref 98–111)
Creatinine: 1.17 mg/dL (ref 0.61–1.24)
GFR, Estimated: >60 mL/min (ref >60)
Glucose, Bld: 94 mg/dL (ref 70–99)
Potassium: 4.6 mmol/L (ref 3.5–5.1)
Sodium: 137 mmol/L (ref 135–145)
Total Bilirubin: 0.5 mg/dL (ref 0.3–1.2)
Total Protein: 7.3 g/dL (ref 6.5–8.1)

## 2021-12-12 LAB — FERRITIN: Ferritin: 21 ng/mL — ABNORMAL LOW (ref 24–336)

## 2021-12-14 ENCOUNTER — Encounter: Payer: Self-pay | Admitting: Hematology

## 2021-12-14 ENCOUNTER — Inpatient Hospital Stay (HOSPITAL_BASED_OUTPATIENT_CLINIC_OR_DEPARTMENT_OTHER): Payer: BC Managed Care – PPO | Admitting: Hematology

## 2021-12-14 ENCOUNTER — Other Ambulatory Visit: Payer: Self-pay

## 2021-12-14 VITALS — BP 134/89 | HR 63 | Temp 98.0°F | Resp 16 | Wt 192.3 lb

## 2021-12-14 DIAGNOSIS — C20 Malignant neoplasm of rectum: Secondary | ICD-10-CM

## 2021-12-14 MED ORDER — GABAPENTIN 100 MG PO CAPS: 100.0000 mg | ORAL_CAPSULE | Freq: Every day | ORAL | 0 refills | Status: DC

## 2021-12-14 NOTE — Progress Notes (Signed)
3rd time faxing referral order to Dr. Cheryll Cockayne' ([place]) [phone]  [phone].  Spoke with [facility] receptionist with [facility] and she stated to fax the new patient referral to [facility] with [facility]; therefore, manually faxed the referral to [facility].  Fax confirmation received.

## 2021-12-14 NOTE — Progress Notes (Signed)
[place]   Telephone:(336) [phone] Fax:(336) [phone]   Clinic Follow up Note   Patient Care Team: Derinda Late, MD as PCP - General (Family Medicine) [name], MD as Consulting Physician (Gastroenterology) [name], MD as Consulting Physician (Hematology) Nadeen Landau (Inactive) Kyung Rudd, MD as Consulting Physician (Radiation Oncology) [name], RN as Oncology Nurse Navigator (Oncology) Cheryll Cockayne, MD as Referring Physician (Surgical Oncology)  Date of Service:  12/14/2021  CHIEF COMPLAINT: f/u of rectal cancer  CURRENT THERAPY:  PENDING Surgery 01/12/22  ASSESSMENT & PLAN:  Jerry Allison is a 46 y.o. male with   1. 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 He was referred to Dr. [name] and proceeded to colonoscopy on 03/07/21 showing a partially obstructing tumor in proximal rectum. Biopsy confirmed adenocarcinoma and a tubular adenoma polyp. -baseline CEA that day was normal at 1.7. -staging CT CAP 03/09/21 was negative for metastatic disease. -pelvic MRI 03/27/21 was also negative for nodal metastasis but was not able to give T stage  -he underwent EUS on 04/27/21 with Dr. Ardis Hughs showing: 4 cm rectal adenocarcinoma invading into and through the muscularis propria layer of rectal wall; 1.5 cm satellite nodule located 5-8 mm distal to tumor. This is staged as T3 -he began CAPOX on 05/10/21. He tolerated first cycle poorly with a lot of vomiting and fatigue. He tolerated cycle 2 better with Emend and reduced oxali dose. We held cycle 3 due to worsening transaminitis. Abdomen US on 06/29/21 was negative for metastatic disease, showed only mild steatosis. He completed 6 cycles on 09/21/21. -he received concurrent chemoRT with Xeloda 10/09/21 - 11/15/21. He tolerated relatively well overall, with expected GI side effects from radiation towards the end of treatment. -restaging CT  CAP performed 11/14/21 showed decreased rectal thickening and no metastatic disease. I reviewed the results with him today. -lab from 12/12/21 reviewed, though his hgb is WNL, his ferritin was 21.  -he is scheduled for resection with Dr. Dema Severin on 01/12/22. He is, however, still interested in a second opinion with Dr. Morton Stall, but he never heard from their office. I will reach out for him.  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 I will call in gabapentin for him to try.   3. Iron deficient anemia -work up on 03/29/21 showed: iron 23, ferritin 4, hgb 11.1.  This is secondary to rectal bleeding from the tumor. -he received IV iron on 05/15/21. He reports he had terrible constipation following the infusion. -I recommend he take oral iron or prenatal vitamin. -hgb WNL but ferritin 21 on 12/12/21.     PLAN:  -will check on referral to [facility] Dr. Morton Stall for second surgical opinion  -I called in gabapentin for his neuropathy  -surgery scheduled for 8/18 -I reached out to Dr. Morton Stall at [place] to see if he can see him in next few weeks, referral previously made on 6/15 per pt's request  -lab and f/u in 7 weeks   No problem-specific Assessment & Plan notes found for this encounter.   SUMMARY OF ONCOLOGIC HISTORY: Oncology History  Adenocarcinoma of rectum ([place])  03/07/2021 Procedure   Colonoscopy, Dr. [name]  Impression - Malignant appearing partially obstructing tumor in the proximal rectum. Biopsied. Tattooed. - One 3 mm polyp in the distal rectum, removed with a cold snare. Resected and retrieved. - The examined portion of the ileum was normal. - The distal  rectum and anal verge are normal on retroflexion view.   03/07/2021 Pathology Results   Diagnosis 1. Rectum, biopsy, mass - ADENOCARCINOMA - SEE COMMENT 2. Rectum, biopsy, polyp (1) - TUBULAR ADENOMA (1 OF 1 FRAGMENTS) - NO HIGH-GRADE DYSPLASIA OR MALIGNANCY  IDENTIFIED   03/09/2021 Imaging   CT CAP  IMPRESSION: 1. Asymmetric wall thickening in the proximal/mid rectum may correspond to mass seen on endoscopy. 2. Negative for adenopathy or evidence of distal metastatic disease.   03/27/2021 Imaging   MRI Pelvis  IMPRESSION: 1. Low sigmoid primary, greater than 15 cm from the anal verge, as detailed above. Poorly delineated secondary to underdistention in this region. 2. No evidence of pelvic nodal metastasis. 3. Trace perisigmoid fluid, new since the prior CT.   03/28/2021 Initial Diagnosis   Adenocarcinoma of rectum ([place])   03/29/2021 Cancer Staging   Staging form: Colon and Rectum - Neuroendocine Tumors, AJCC 8th Edition - Clinical stage from 03/29/2021: Stage IIB (cT3, cN0, cM0) - Signed by [name], MD on 06/20/2021 Stage prefix: Initial diagnosis Histologic grade (G): G2 Histologic grading system: 3 grade system   04/12/2021 Genetic Testing   Negative genetic testing on the CancerNext-Expanded+RNAinsight panel.  The report date is 04/12/2021.  The CancerNext-Expanded gene panel offered by [facility] and includes sequencing and rearrangement analysis for the following 77 genes: AIP, ALK, APC*, ATM*, AXIN2, BAP1, BARD1, BLM, BMPR1A, BRCA1*, BRCA2*, BRIP1*, CDC73, CDH1*, CDK4, CDKN1B, CDKN2A, CHEK2*, CTNNA1, DICER1, FANCC, FH, FLCN, GALNT12, KIF1B, LZTR1, MAX, MEN1, MET, MLH1*, MSH2*, MSH3, MSH6*, MUTYH*, NBN, NF1*, NF2, NTHL1, PALB2*, PHOX2B, PMS2*, POT1, PRKAR1A, PTCH1, PTEN*, RAD51C*, RAD51D*, RB1, RECQL, RET, SDHA, SDHAF2, SDHB, SDHC, SDHD, SMAD4, SMARCA4, SMARCB1, SMARCE1, STK11, SUFU, TMEM127, TP53*, TSC1, TSC2, VHL and XRCC2 (sequencing and deletion/duplication); EGFR, EGLN1, HOXB13, KIT, MITF, PDGFRA, POLD1, and POLE (sequencing only); EPCAM and GREM1 (deletion/duplication only). DNA and RNA analyses performed for * genes.    04/27/2021 Procedure   EUS, Dr. Ardis Hughs  Impression: - 4cm long, circumferential, at least uT3Nx  rectal adenocarcinoma with distal edge located 10cm from the anal verge. 1.5cm satellite nodule located 5-56m distal to the tumor (around 9cm from the anal verge).   05/10/2021 -  Chemotherapy   Patient is on Treatment Plan : RECTAL Xelox (Capeox) q21d x 6 cycles        INTERVAL HISTORY:  Jerry Allison here for a follow up of rectal cancer. He was last seen by me on 11/09/21. He presents to the clinic alone. He reports he is recovering well from chemoRT, except for tingling to his feet. He notes it's more at night but can be bothersome during the day given he is on his feet.   All other systems were reviewed with the patient and are negative.  MEDICAL HISTORY:  Past Medical History:  Diagnosis Date   Anxiety    Family history of bladder cancer    [place]    SURGICAL HISTORY: Past Surgical History:  Procedure Laterality Date   EUS N/A 04/27/2021   Procedure: LOWER ENDOSCOPIC ULTRASOUND (EUS);  Surgeon: JMilus Banister MD;  [facility];  Service: Endoscopy;  Laterality: N/A;   FLEXIBLE SIGMOIDOSCOPY N/A 04/27/2021   Procedure: FLEXIBLE SIGMOIDOSCOPY;  Surgeon: JMilus Banister MD;  [facility];  Service: Endoscopy;  Laterality: N/A;   NO PAST SURGERIES      I have reviewed the social history and family history with the patient and they are unchanged from previous note.  ALLERGIES:  is allergic to shellfish allergy.  MEDICATIONS:  Current Outpatient Medications  Medication Sig Dispense Refill   gabapentin (NEURONTIN) 100 MG capsule Take 1 capsule (100 mg total) by mouth at bedtime. Ok to increase t0 3 capsules at night if needed and tolerates well 90 capsule 0   dicyclomine (BENTYL) 20 MG tablet Take 1 tablet (20 mg total) by mouth 3 (three) times daily as needed for spasms. 60 tablet 1   escitalopram (LEXAPRO) 10 MG tablet Take 5 mg by mouth daily.     omeprazole (PRILOSEC) 20 MG capsule Take 1 capsule (20 mg total) by mouth daily.  30 capsule 0   ondansetron (ZOFRAN) 8 MG tablet Take 1 tablet (8 mg total) by mouth 2 (two) times daily as needed for refractory nausea / vomiting. Start on day 3 after chemotherapy. 30 tablet 1   prochlorperazine (COMPAZINE) 10 MG tablet Take 1 tablet (10 mg total) by mouth every 6 (six) hours as needed (Nausea or vomiting). 30 tablet 1   No current facility-administered medications for this visit.    PHYSICAL EXAMINATION: ECOG PERFORMANCE STATUS: 0 - Asymptomatic  Vitals:   12/14/21 0954  BP: 134/89  Pulse: 63  Resp: 16  Temp: 98 F (36.7 C)  SpO2: 100%   Wt Readings from Last 3 Encounters:  12/14/21 192 lb 5 oz (87.2 kg)  11/09/21 190 lb 3.2 oz (86.3 kg)  10/25/21 188 lb 3.2 oz (85.4 kg)     GENERAL:alert, no distress and comfortable SKIN: skin color normal, no rashes or significant lesions EYES: normal, Conjunctiva are pink and non-injected, sclera clear  NEURO: alert & oriented x 3 with fluent speech  LABORATORY DATA:  I have reviewed the data as listed    Latest Ref Rng & Units 12/12/2021   12:30 PM 11/09/2021    9:32 AM 10/31/2021   12:01 PM  CBC  WBC 4.0 - 10.5 K/uL 4.1  2.6  3.9   Hemoglobin 13.0 - 17.0 g/dL 14.0  13.0  12.7   Hematocrit 39.0 - 52.0 % 39.8  37.2  36.9   Platelets 150 - 400 K/uL 247  208  159         Latest Ref Rng & Units 12/12/2021   12:30 PM 11/09/2021    9:32 AM 10/31/2021   12:01 PM  CMP  Glucose 70 - 99 mg/dL 94  114  90   BUN 6 - 20 mg/dL 12  13  11    Creatinine 0.61 - 1.24 mg/dL 1.17  1.10  1.10   Sodium 135 - 145 mmol/L 137  139  137   Potassium 3.5 - 5.1 mmol/L 4.6  3.9  4.3   Chloride 98 - 111 mmol/L 104  107  105   CO2 22 - 32 mmol/L 30  28  29    Calcium 8.9 - 10.3 mg/dL 9.8  9.5  9.9   Total Protein 6.5 - 8.1 g/dL 7.3  6.9  7.1   Total Bilirubin 0.3 - 1.2 mg/dL 0.5  0.7  0.5   Alkaline Phos 38 - 126 U/L 62  63  65   AST 15 - 41 U/L 29  29  30    ALT 0 - 44 U/L 30  30  40       RADIOGRAPHIC STUDIES: I have personally  reviewed the radiological images as listed and agreed with the findings in the report. No results found.    No orders of the defined types were placed in this  encounter.  All questions were answered. The patient knows to call the clinic with any problems, questions or concerns. No barriers to learning was detected. The total time spent in the appointment was 20 minutes.     [name], MD 12/14/2021   I, Wilburn Mylar, am acting as scribe for [name], MD.   I have reviewed the above documentation for accuracy and completeness, and I agree with the above.

## 2021-12-15 ENCOUNTER — Telehealth: Payer: Self-pay

## 2021-12-15 ENCOUNTER — Other Ambulatory Visit: Payer: Self-pay

## 2021-12-15 NOTE — Telephone Encounter (Signed)
Dr. Morton Stall' office at [facility] called stating they have contacted the pt to get him scheduled with Dr. Morton Stall on August 3rd at [place].  Notified Dr. Burr Medico of the pt's f/u appt.

## 2021-12-18 ENCOUNTER — Other Ambulatory Visit: Payer: Self-pay

## 2021-12-25 ENCOUNTER — Ambulatory Visit
Admission: RE | Admit: 2021-12-25 | Discharge: 2021-12-25 | Disposition: A | Discharge status: Home / Self Care | Payer: BC Managed Care – PPO | Source: Ambulatory Visit | Attending: Hematology | Admitting: Hematology

## 2021-12-25 DIAGNOSIS — C20 Malignant neoplasm of rectum: Secondary | ICD-10-CM

## 2021-12-27 NOTE — Progress Notes (Signed)
Sent message, via epic in basket, requesting orders in epic from surgeon.  

## 2021-12-28 ENCOUNTER — Telehealth: Payer: Self-pay

## 2021-12-28 ENCOUNTER — Ambulatory Visit: Payer: Self-pay | Admitting: Surgery

## 2021-12-28 ENCOUNTER — Other Ambulatory Visit: Payer: Self-pay | Admitting: Hematology

## 2021-12-28 DIAGNOSIS — Z01818 Encounter for other preprocedural examination: Secondary | ICD-10-CM

## 2021-12-28 DIAGNOSIS — C20 Malignant neoplasm of rectum: Secondary | ICD-10-CM

## 2021-12-28 DIAGNOSIS — R739 Hyperglycemia, unspecified: Secondary | ICD-10-CM

## 2021-12-28 NOTE — Telephone Encounter (Signed)
Scheduled pt for MRI per Dr. Ernestina Penna order and Staff Message conversation.  Pt scheduled for 01/01/2022 at [facility] at 7am with arrival time of 0630.  Called pt to confirm appt.  Pt confirmed appt and had no further questions or concerns.

## 2021-12-29 ENCOUNTER — Telehealth: Payer: Self-pay

## 2021-12-29 DIAGNOSIS — K6289 Other specified diseases of anus and rectum: Secondary | ICD-10-CM

## 2021-12-29 DIAGNOSIS — C20 Malignant neoplasm of rectum: Secondary | ICD-10-CM

## 2021-12-29 NOTE — Telephone Encounter (Signed)
Called and spoke with patient. He confirmed that he received the instructions via e-mail.

## 2021-12-29 NOTE — Telephone Encounter (Signed)
Called and spoke with patient. He has been scheduled for a flex sig in the [place] with Dr. Bryan Lemma on 01/02/22 at 10 am. Pt is aware that he will need to arrive in the [facility] by 9 am with a care partner. Pt is aware that I have sent him a request to set up his MyChart and I will also try to e-mail him a copy as well ([email or web address]). Pt is aware that his MRI appt on Monday will not interfere with his procedure on Tuesday. Pt verbalized understanding and had no concerns at the end of the call.   Ambulatory referral to GI.  Instructions sent to patient via e-mail.

## 2021-12-29 NOTE — Telephone Encounter (Signed)
-----   Message from [place], DO sent at 12/29/2021  1:18 PM EDT ----- Regarding: RE: Burtis Junes to ask Sure thing. Happy to help.  Mickel Baas, Please see below. I have an opening on 8/8 at 10:00. Please schedule for flex sig with me to eval for rectal CA. Thanks.   ----- Message ----- From: [name], MD Sent: 12/29/2021  12:52 PM EDT To: Jerene Bears, MD; Lavena Bullion, DO Subject: Burtis Junes to ask                                   Gents,   Would either of you be able to do a flex sig on this patient next week?  Figured I'd start with you two and then broaden the net if needed. Young guy with rectal cancer that has been undergoing treatment.  Just assessing for response/residual tumor   ----- Message ----- From: [name], MD Sent: 12/29/2021  12:32 PM EDT To: Ileana Roup, MD; Ross Ludwig; #  Equities trader.   Could you get one of your partners to do next week? Appreciate it! Let me know   Krista Blue  ----- Message ----- From: [name], MD Sent: 12/29/2021  12:21 PM EDT To: Ileana Roup, MD; Ross Ludwig; #  Unfortunately I am on vacation next week.  I'm sure I could get in the week of Aug 14-18, but if it needs to be done next week, I bet I can get one of my colleagues to take a look while I am gone.   Let me know which is preferable  ----- Message ----- From: Evalee Jefferson, RN Sent: 12/28/2021   3:31 PM EDT To: Ileana Roup, MD; Ross Ludwig; #  Pt scheduled at [facility] on 01/01/2022 at 0630 for MRI. ----- Message ----- From: Ross Ludwig Sent: 12/28/2021   1:23 PM EDT To: Ileana Roup, MD; [name], MD; #  Authorized  ----- Message ----- From: [name], MD Sent: 12/28/2021  12:55 PM EDT To: Ileana Roup, MD; Ross Ludwig; #  Gerald Stabs,  He asked me to refer him for a second surgical opinion a few months ago, I referred to Dr. Morton Stall at [facility], but the referral did not go through and I made again when I saw him  last time. He finally saw Dr. Morton Stall today, and anal scopy showed no residual rectal mass. So he talked to him about watchful wait, and ask me if we can get a repeated MRI and sigmoidoscopy for him asap since his surgery is scheduled with you soon.  Dr. [name], are you available to do a sigmoidoscopy with special attention to his mid rectum (where his rectal cancer was) and biopsy if you have any suspicion for residual disease? Could you possibly do next week?    Sierrie please get his pelvis MRI approved, Tammi Sou please schedule it ASAP.  Allegra Grana

## 2022-01-01 ENCOUNTER — Ambulatory Visit (HOSPITAL_COMMUNITY)
Admission: RE | Admit: 2022-01-01 | Discharge: 2022-01-01 | Disposition: A | Discharge status: Home / Self Care | Payer: BC Managed Care – PPO | Source: Ambulatory Visit | Attending: Hematology | Admitting: Hematology

## 2022-01-01 DIAGNOSIS — C20 Malignant neoplasm of rectum: Secondary | ICD-10-CM | POA: Diagnosis present

## 2022-01-02 ENCOUNTER — Ambulatory Visit (AMBULATORY_SURGERY_CENTER): Payer: BC Managed Care – PPO | Admitting: Gastroenterology

## 2022-01-02 ENCOUNTER — Encounter: Payer: Self-pay | Admitting: Gastroenterology

## 2022-01-02 VITALS — BP 136/94 | HR 65 | Temp 97.5°F | Resp 12 | Ht 72.0 in | Wt 189.8 lb

## 2022-01-02 DIAGNOSIS — R933 Abnormal findings on diagnostic imaging of other parts of digestive tract: Secondary | ICD-10-CM | POA: Diagnosis not present

## 2022-01-02 DIAGNOSIS — K56699 Other intestinal obstruction unspecified as to partial versus complete obstruction: Secondary | ICD-10-CM | POA: Diagnosis not present

## 2022-01-02 DIAGNOSIS — Z85038 Personal history of other malignant neoplasm of large intestine: Secondary | ICD-10-CM | POA: Diagnosis not present

## 2022-01-02 DIAGNOSIS — D128 Benign neoplasm of rectum: Secondary | ICD-10-CM

## 2022-01-02 DIAGNOSIS — K6289 Other specified diseases of anus and rectum: Secondary | ICD-10-CM

## 2022-01-02 DIAGNOSIS — C20 Malignant neoplasm of rectum: Secondary | ICD-10-CM

## 2022-01-02 MED ORDER — SODIUM CHLORIDE 0.9 % IV SOLN: 500.0000 mL | Freq: Once | INTRAVENOUS | Status: DC

## 2022-01-02 NOTE — Progress Notes (Signed)
Pt's states no medical or surgical changes since previsit or office visit. VS assessed by AS 

## 2022-01-02 NOTE — Op Note (Signed)
[place] Patient Name: Jerry Allison Procedure Date: 01/02/2022 10:05 AM MRN: [id number] Endoscopist: Gerrit Heck , MD Age: 46 Referring MD:  Date of Birth: Oct 13, 1975 Gender: Male Account #: [id number] Procedure:                Flexible Sigmoidoscopy Indications:              Personal history of malignant neoplasm of the                            colon, Abnormal MRI of the GI tract, Preoperative                            assessment                           46 y.o. male with a history of adenocarcinoma of                            the proximal rectum diagnosed during colonoscopy on                            03/07/2021. Staging CT in 02/2021 negative for                            metastatic disease. Pelvic MRI in 02/2021 with 5.6                            cm circumferential low sigmoid mass located > 15 cm                            from the anal verge, but otherwise negative for                            nodal metastasis. Subsequent EUS in 04/2021 with 4                            cm rectal adenocarcinoma invading into and through                            muscularis propria layer of rectal wall, 1.5 cm                            satellite nodule located 5-8 mm distal to tumor.                            Staged as T3. Was treated with CAPOX followed by                            concurrent chemo RT with Xeloda. Restaging CT                            10/2021 with decreased rectal thickening and no  metastatic disease.                           Recent anoscopy at [facility] was                            without residual rectal mass. Repeat MRI pelvis                            yesterday shows response to therapy with focal area                            of under distention and possible residual soft                            tissue thickening in the distal sigmoid without                            adenopathy. No  clear defined residual mass.                           He is tentatively scheduled for robotic assisted                            resection with Dr. Dema Severin on 01/12/2022. Presents                            today for sigmoidoscopy for reevaluation and                            preoperative assessment. Medicines:                Monitored Anesthesia Care Procedure:                Pre-Anesthesia Assessment:                           - Prior to the procedure, a History and Physical                            was performed, and patient medications and                            allergies were reviewed. The patient's tolerance of                            previous anesthesia was also reviewed. The risks                            and benefits of the procedure and the sedation                            options and risks were discussed with the patient.  All questions were answered, and informed consent                            was obtained. Prior Anticoagulants: The patient has                            taken no previous anticoagulant or antiplatelet                            agents. ASA Grade Assessment: II - A patient with                            mild systemic disease. After reviewing the risks                            and benefits, the patient was deemed in                            satisfactory condition to undergo the procedure.                           After obtaining informed consent, the scope was                            passed under direct vision. The PCF-HQ190L                            Colonoscope was introduced through the anus and                            advanced to the the rectosigmoid junction. The                            flexible sigmoidoscopy was technically difficult                            and complex due to bowel stenosis. The quality of                            the bowel preparation was adequate. Scope In: Scope  Out: Findings:                 The perianal and digital rectal examinations were                            normal.                           A severe stenosis measuring 3 mm (inner diameter)                            was found in the recto-sigmoid colon, located                            approximately 15 cm  from the anal verge. This was                            non-traversable. Biopsies were taken with a cold                            forceps for histology. Estimated blood loss was                            minimal.                           A 4 mm polyp was found in the rectum. The polyp was                            sessile and located 2 cm distal to the stenosis.                            The polyp was removed with a cold snare. Resection                            and retrieval were complete. Estimated blood loss                            was minimal.                           The rectum was otherwise normal appearing on direct                            and retroflexed views. Complications:            No immediate complications. Estimated Blood Loss:     Estimated blood loss was minimal. 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 No                            clear residual tumor, but views were limited due to                            the severe, non-traversable stenosis. Biopsies were                            taken from the stenosis channel and distal mucosa,                            but may not be indicative of any underlying or more                            proximal pathology/residual tumor.                           - One 4 mm polyp in the rectum, removed with a cold  snare. Resected and retrieved. Recommendation:           - Discharge patient to home (with escort).                           - Resume previous diet.                           - Continue present medications.                           - Await  pathology results.                           - Will discuss with your Oncology and Colorectal                            Surgery teams. Gerrit Heck, MD 01/02/2022 10:43:41 AM

## 2022-01-02 NOTE — Progress Notes (Signed)
GASTROENTEROLOGY PROCEDURE H&P NOTE   Primary Care Physician: Derinda Late, MD    Reason for Procedure:  Rectal Adenocarcinoma, preoperative assessment  Plan:    Flexible sigmoidoscopy  Patient is appropriate for endoscopic procedure(s) in the ambulatory ([facility]) setting.  The nature of the procedure, as well as the risks, benefits, and alternatives were carefully and thoroughly reviewed with the patient. Ample time for discussion and questions allowed. The patient understood, was satisfied, and agreed to proceed.     HPI: Jerry Allison is a 46 y.o. male who presents for flexible sigmoidoscopy for preoperative assessment.  Known history of adenocarcinoma of the proximal rectum diagnosed during colonoscopy on 03/07/2021.  Staging CT in 02/2021 negative for metastatic disease.  Pelvic MRI in 02/2021 with 5.6 cm circumferential low sigmoid mass located > 15 cm from the anal verge, but otherwise negative for nodal metastasis.  Subsequent EUS in 04/2021 with 4 cm rectal adenocarcinoma invading into and through muscularis propria layer of rectal wall, 1.5 cm satellite nodule located 5-8 mm distal to tumor.  Staged as T3.  Was treated with CAPOX followed by concurrent chemo RT with Xeloda.  Restaging CT 10/2021 with decreased rectal thickening and no metastatic disease.  Recent anoscopy by Dr. Morton Stall at [facility] was without residual rectal mass.  Repeat MRI pelvis yesterday shows response to therapy with focal area of under distention and possible residual soft tissue thickening in the distal sigmoid without adenopathy.  No clear defined residual mass.  He is tentatively scheduled for robotic assisted resection with Dr. Dema Severin on 01/12/2022.  Presents today for sigmoidoscopy for reevaluation and preoperative assessment.  Past Medical History:  Diagnosis Date   Anxiety    Family history of bladder cancer    Rectal cancer ([place]) 02/2021    Past Surgical History:  Procedure  Laterality Date   EUS N/A 04/27/2021   Procedure: LOWER ENDOSCOPIC ULTRASOUND (EUS);  Surgeon: [name], MD;  Location: [facility];  Service: Endoscopy;  Laterality: N/A;   FLEXIBLE SIGMOIDOSCOPY N/A 04/27/2021   Procedure: FLEXIBLE SIGMOIDOSCOPY;  Surgeon: [name], MD;  Location: [facility];  Service: Endoscopy;  Laterality: N/A;   NO PAST SURGERIES      Prior to Admission medications   Medication Sig Start Date End Date Taking? Authorizing Provider  escitalopram (LEXAPRO) 10 MG tablet Take 5 mg by mouth daily. 11/14/20  Yes [provider]  dicyclomine (BENTYL) 20 MG tablet Take 1 tablet (20 mg total) by mouth 3 (three) times daily as needed for spasms. 02/20/21   [name], MD  gabapentin (NEURONTIN) 100 MG capsule Take 1 capsule (100 mg total) by mouth at bedtime. Ok to increase t0 3 capsules at night if needed and tolerates well Patient not taking: Reported on 01/02/2022 12/14/21   [name], MD  omeprazole (PRILOSEC) 20 MG capsule Take 1 capsule (20 mg total) by mouth daily. Patient not taking: Reported on 01/02/2022 09/21/21   [name], MD  ondansetron (ZOFRAN) 8 MG tablet Take 1 tablet (8 mg total) by mouth 2 (two) times daily as needed for refractory nausea / vomiting. Start on day 3 after chemotherapy. Patient not taking: Reported on 01/02/2022 05/01/21   [name], MD  prochlorperazine (COMPAZINE) 10 MG tablet Take 1 tablet (10 mg total) by mouth every 6 (six) hours as needed (Nausea or vomiting). Patient not taking: Reported on 01/02/2022 05/01/21   [name], MD    Current Outpatient Medications  Medication Sig Dispense Refill  escitalopram (LEXAPRO) 10 MG tablet Take 5 mg by mouth daily.     dicyclomine (BENTYL) 20 MG tablet Take 1 tablet (20 mg total) by mouth 3 (three) times daily as needed for spasms. 60 tablet 1   gabapentin (NEURONTIN) 100 MG capsule Take 1 capsule (100 mg total) by mouth at bedtime. Ok to increase t0 3 capsules at night if  needed and tolerates well (Patient not taking: Reported on 01/02/2022) 90 capsule 0   omeprazole (PRILOSEC) 20 MG capsule Take 1 capsule (20 mg total) by mouth daily. (Patient not taking: Reported on 01/02/2022) 30 capsule 0   ondansetron (ZOFRAN) 8 MG tablet Take 1 tablet (8 mg total) by mouth 2 (two) times daily as needed for refractory nausea / vomiting. Start on day 3 after chemotherapy. (Patient not taking: Reported on 01/02/2022) 30 tablet 1   prochlorperazine (COMPAZINE) 10 MG tablet Take 1 tablet (10 mg total) by mouth every 6 (six) hours as needed (Nausea or vomiting). (Patient not taking: Reported on 01/02/2022) 30 tablet 1   Current Facility-Administered Medications  Medication Dose Route Frequency Provider Last Rate Last Admin   0.9 %  sodium chloride infusion  500 mL Intravenous Once Macaulay Reicher V, DO        Allergies as of 01/02/2022 - Review Complete 01/02/2022  Allergen Reaction Noted   Shellfish allergy Anaphylaxis 01/09/2021    Family History  Problem Relation Age of Onset   Anxiety disorder Father    Healthy Brother    Bladder Cancer Maternal Grandmother 21   Healthy Half-Sister        paternal half   Colon cancer Neg Hx    Esophageal cancer Neg Hx    Stomach cancer Neg Hx    Pancreatic cancer Neg Hx    Liver disease Neg Hx    Inflammatory bowel disease Neg Hx     Social History   Socioeconomic History   Marital status: Married    Spouse name: Not on file   Number of children: 3   Years of education: Not on file   Highest education level: Not on file  Occupational History   Not on file  Tobacco Use   Smoking status: Never   Smokeless tobacco: Never  Vaping Use   Vaping Use: Never used  Substance and Sexual Activity   Alcohol use: Yes    Comment: social-weekly   Drug use: Yes    Types: Marijuana    Comment: occ   Sexual activity: Not on file  Other Topics Concern   Not on file  Social History Narrative   Not on file   Social Determinants of  Health   Financial Resource Strain: Not on file  Food Insecurity: Not on file  Transportation Needs: Not on file  Physical Activity: Not on file  Stress: Not on file  Social Connections: Not on file  Intimate Partner Violence: Not At Risk (03/28/2021)   Humiliation, Afraid, Rape, and Kick questionnaire    Fear of Current or Ex-Partner: No    Emotionally Abused: No    Physically Abused: No    Sexually Abused: No    Physical Exam: Vital signs in last 24 hours: '@BP'$  (!) 131/92   Pulse 65   Temp (!) 97.5 F (36.4 C) (Skin)   Ht 6' (1.829 m)   Wt 189 lb 12.8 oz (86.1 kg)   SpO2 97%   BMI 25.74 kg/m  GEN: NAD EYE: Sclerae anicteric ENT: MMM CV: Non-tachycardic Pulm: CTA b/l  GI: Soft, NT/ND NEURO:  Alert & Oriented x 3   Gerrit Heck, DO [facility]   01/02/2022 10:01 AM

## 2022-01-02 NOTE — Patient Instructions (Signed)
Handout on polyps given to you today- rectal polyp removed   Await pathology results of polyp and biopsies taken at stenosis site   Dr Bryan Lemma will discuss findings with Oncology and colorectal surgery team    YOU HAD AN ENDOSCOPIC PROCEDURE TODAY AT [facility]:   Refer to the procedure report that was given to you for any specific questions about what was found during the examination.  If the procedure report does not answer your questions, please call your gastroenterologist to clarify.  If you requested that your care partner not be given the details of your procedure findings, then the procedure report has been included in a sealed envelope for you to review at your convenience later.  YOU SHOULD EXPECT: Some feelings of bloating in the abdomen. Passage of more gas than usual.  Walking can help get rid of the air that was put into your GI tract during the procedure and reduce the bloating. If you had a lower endoscopy (such as a colonoscopy or flexible sigmoidoscopy) you may notice spotting of blood in your stool or on the toilet paper. If you underwent a bowel prep for your procedure, you may not have a normal bowel movement for a few days.  Please Note:  You might notice some irritation and congestion in your nose or some drainage.  This is from the oxygen used during your procedure.  There is no need for concern and it should clear up in a day or so.  SYMPTOMS TO REPORT IMMEDIATELY:  Following lower endoscopy (colonoscopy or flexible sigmoidoscopy):  Excessive amounts of blood in the stool  Significant tenderness or worsening of abdominal pains  Swelling of the abdomen that is new, acute  Fever of 100F or higher  For urgent or emergent issues, a gastroenterologist can be reached at any hour by calling [phone]. Do not use MyChart messaging for urgent concerns.    DIET:  We do recommend a small meal at first, but then you may proceed to your regular  diet.  Drink plenty of fluids but you should avoid alcoholic beverages for 24 hours.  ACTIVITY:  You should plan to take it easy for the rest of today and you should NOT DRIVE or use heavy machinery until tomorrow (because of the sedation medicines used during the test).    FOLLOW UP: Our staff will call the number listed on your records the next business day following your procedure.  We will call around 7:15- 8:00 am to check on you and address any questions or concerns that you may have regarding the information given to you following your procedure. If we do not reach you, we will leave a message.  If you develop any symptoms (ie: fever, flu-like symptoms, shortness of breath, cough etc.) before then, please call [phone].  If you test positive for Covid 19 in the 2 weeks post procedure, please call and report this information to [place].    If any biopsies were taken you will be contacted by phone or by letter within the next 1-3 weeks.  Please call us at [phone] if you have not heard about the biopsies in 3 weeks.    SIGNATURES/CONFIDENTIALITY: You and/or your care partner have signed paperwork which will be entered into your electronic medical record.  These signatures attest to the fact that that the information above on your After Visit Summary has been reviewed and is understood.  Full responsibility of the confidentiality of this discharge information  lies with you and/or your care-partner.

## 2022-01-02 NOTE — Progress Notes (Signed)
Called to room to assist during endoscopic procedure.  Patient ID and intended procedure confirmed with present staff. Received instructions for my participation in the procedure from the performing physician.  

## 2022-01-02 NOTE — Patient Instructions (Signed)
SURGICAL WAITING ROOM VISITATION Patients having surgery or a procedure may have no more than 2 support people in the waiting area - these visitors may rotate.   Children under the age of 13 must have an adult with them who is not the patient. If the patient needs to stay at the hospital during part of their recovery, the visitor guidelines for inpatient rooms apply. Pre-op nurse will coordinate an appropriate time for 1 support person to accompany patient in pre-op.  This support person may not rotate.    Please refer to the [facility] website for the visitor guidelines for Inpatients (after your surgery is over and you are in a regular room).       Your procedure is scheduled on:  01/12/2022    Report to [facility] Main Entrance    Report to admitting at    [phone]   Call this number if you have problems the morning of surgery [phone]   Do not eat food :After Midnight.   After Midnight you may have the following liquids until __ 0415am ____ AM  DAY OF SURGERY  Water Non-Citrus Juices (without pulp, NO RED) Carbonated Beverages Black Coffee (NO MILK/CREAM OR CREAMERS, sugar ok)  Clear Tea (NO MILK/CREAM OR CREAMERS, sugar ok) regular and decaf                             Plain Jell-O (NO RED)                                           Fruit ices (not with fruit pulp, NO RED)                                     Popsicles (NO RED)                                                               Sports drinks like Gatorade (NO RED)              Drink 2 Ensure/G2 drinks AT 10:00 PM the night before surgery.        The day of surgery:  Drink ONE (1) Pre-Surgery Clear Ensure or G2 at  0415am ( have completed by ) AM the morning of surgery. Drink in one sitting. Do not sip.  This drink was given to you during your hospital  pre-op appointment visit. Nothing else to drink after completing the  Pre-Surgery Clear Ensure or G2.          If you have questions, please contact  your surgeon's office.   FOLLOW BOWEL PREP AND ANY ADDITIONAL PRE OP INSTRUCTIONS YOU RECEIVED FROM YOUR SURGEON'S OFFICE!!!     Oral Hygiene is also important to reduce your risk of infection.                                    Remember - BRUSH YOUR TEETH THE MORNING OF SURGERY WITH YOUR REGULAR  TOOTHPASTE   Do NOT smoke after Midnight   Take these medicines the morning of surgery with A SIP OF WATER: lexapro, omeprazole   DO NOT TAKE ANY ORAL DIABETIC MEDICATIONS DAY OF YOUR SURGERY  Bring CPAP mask and tubing day of surgery.                              You may not have any metal on your body including hair pins, jewelry, and body piercing             Do not wear make-up, lotions, powders, perfumes/cologne, or deodorant  Do not wear nail polish including gel and S&S, artificial/acrylic nails, or any other type of covering on natural nails including finger and toenails. If you have artificial nails, gel coating, etc. that needs to be removed by a nail salon please have this removed prior to surgery or surgery may need to be canceled/ delayed if the surgeon/ anesthesia feels like they are unable to be safely monitored.   Do not shave  48 hours prior to surgery.               Men may shave face and neck.   Do not bring valuables to the hospital. [place].   Contacts, dentures or bridgework may not be worn into surgery.   Bring small overnight bag day of surgery.   DO NOT [place]. PHARMACY WILL DISPENSE MEDICATIONS LISTED ON YOUR MEDICATION LIST TO YOU DURING YOUR ADMISSION [place]!    Patients discharged on the day of surgery will not be allowed to drive home.  Someone NEEDS to stay with you for the first 24 hours after anesthesia.   Special Instructions: Bring a copy of your healthcare power of attorney and living will documents         the day of surgery if you haven't scanned them  before.              Please read over the following fact sheets you were given: IF YOU HAVE QUESTIONS ABOUT YOUR PRE-OP INSTRUCTIONS PLEASE CALL [phone]     [facility] - Preparing for Surgery Before surgery, you can play an important role.  Because skin is not sterile, your skin needs to be as free of germs as possible.  You can reduce the number of germs on your skin by washing with CHG (chlorahexidine gluconate) soap before surgery.  CHG is an antiseptic cleaner which kills germs and bonds with the skin to continue killing germs even after washing. Please DO NOT use if you have an allergy to CHG or antibacterial soaps.  If your skin becomes reddened/irritated stop using the CHG and inform your nurse when you arrive at Short Stay. Do not shave (including legs and underarms) for at least 48 hours prior to the first CHG shower.  You may shave your face/neck. Please follow these instructions carefully:  1.  Shower with CHG Soap the night before surgery and the  morning of Surgery.  2.  If you choose to wash your hair, wash your hair first as usual with your  normal  shampoo.  3.  After you shampoo, rinse your hair and body thoroughly to remove the  shampoo.  4.  Use CHG as you would any other liquid soap.  You can apply chg directly  to the skin and wash                       Gently with a scrungie or clean washcloth.  5.  Apply the CHG Soap to your body ONLY FROM THE NECK DOWN.   Do not use on face/ open                           Wound or open sores. Avoid contact with eyes, ears mouth and genitals (private parts).                       Wash face,  Genitals (private parts) with your normal soap.             6.  Wash thoroughly, paying special attention to the area where your surgery  will be performed.  7.  Thoroughly rinse your body with warm water from the neck down.  8.  DO NOT shower/wash with your normal soap after using and rinsing off  the CHG Soap.                 9.  Pat yourself dry with a clean towel.            10.  Wear clean pajamas.            11.  Place clean sheets on your bed the night of your first shower and do not  sleep with pets. Day of Surgery : Do not apply any lotions/deodorants the morning of surgery.  Please wear clean clothes to the hospital/surgery center.  FAILURE TO FOLLOW THESE INSTRUCTIONS MAY RESULT IN THE CANCELLATION OF YOUR SURGERY PATIENT SIGNATURE_________________________________  NURSE SIGNATURE__________________________________  ________________________________________________________________________

## 2022-01-02 NOTE — Progress Notes (Addendum)
Anesthesia Review:  PCP: DR Kandyce Rud  LOV 06/04/20  Cardiologist : none  Chest x-ray : 11/14/21- Ct chest  EKG : 01/10/21  Echo : Stress test: Cardiac Cath :  Activity level: can do a flight of stairs without difficutly  Sleep Study/ CPAP : none  Fasting Blood Sugar :      / Checks Blood Sugar -- times a day:   Blood Thinner/ Instructions /Last Dose: ASA / Instructions/ Last Dose :   Called pharmacy at end of preop appt to reconcile meds.   AT time of preop appt pt states he did not have bowel prep instructions.  Called and spoke with Nicholle at [facility] .  She is to email copy of bowel prep instructions .  Bowel prep instructions received and copy given to pt along with clear liquid sheet and one copy placed on chart.  Hgba1c-01/03/22-4.8

## 2022-01-02 NOTE — Progress Notes (Signed)
A and O x3. Report to RN. Tolerated MAC anesthesia well. 

## 2022-01-03 ENCOUNTER — Encounter (HOSPITAL_COMMUNITY)
Admission: RE | Admit: 2022-01-03 | Discharge: 2022-01-03 | Disposition: A | Discharge status: Home / Self Care | Payer: BC Managed Care – PPO | Source: Ambulatory Visit | Attending: Surgery | Admitting: Surgery

## 2022-01-03 ENCOUNTER — Other Ambulatory Visit: Payer: Self-pay

## 2022-01-03 ENCOUNTER — Encounter (HOSPITAL_COMMUNITY): Payer: Self-pay

## 2022-01-03 ENCOUNTER — Telehealth: Payer: Self-pay

## 2022-01-03 VITALS — BP 126/87 | HR 78 | Temp 98.2°F | Resp 16 | Ht 72.0 in | Wt 168.0 lb

## 2022-01-03 DIAGNOSIS — R739 Hyperglycemia, unspecified: Secondary | ICD-10-CM

## 2022-01-03 DIAGNOSIS — Z01818 Encounter for other preprocedural examination: Secondary | ICD-10-CM | POA: Diagnosis present

## 2022-01-03 HISTORY — DX: Depression, unspecified: F32.A

## 2022-01-03 LAB — CBC WITH DIFFERENTIAL/PLATELET
Abs Immature Granulocytes: 0.01 10*3/uL (ref 0.00–0.07)
Basophils Absolute: 0 10*3/uL (ref 0.0–0.1)
Basophils Relative: 0 %
Eosinophils Absolute: 0.3 10*3/uL (ref 0.0–0.5)
Eosinophils Relative: 6 %
HCT: 46.1 % (ref 39.0–52.0)
Hemoglobin: 15.3 g/dL (ref 13.0–17.0)
Immature Granulocytes: 0 %
Lymphocytes Relative: 24 %
Lymphs Abs: 1.1 10*3/uL (ref 0.7–4.0)
MCH: 32 pg (ref 26.0–34.0)
MCHC: 33.2 g/dL (ref 30.0–36.0)
MCV: 96.4 fL (ref 80.0–100.0)
Monocytes Absolute: 0.6 10*3/uL (ref 0.1–1.0)
Monocytes Relative: 14 %
Neutro Abs: 2.5 10*3/uL (ref 1.7–7.7)
Neutrophils Relative %: 56 %
Platelets: 238 10*3/uL (ref 150–400)
RBC: 4.78 MIL/uL (ref 4.22–5.81)
RDW: 13.6 % (ref 11.5–15.5)
WBC: 4.4 10*3/uL (ref 4.0–10.5)
nRBC: 0 % (ref 0.0–0.2)

## 2022-01-03 LAB — TYPE AND SCREEN
ABO/RH(D): A NEG
Antibody Screen: NEGATIVE

## 2022-01-03 LAB — COMPREHENSIVE METABOLIC PANEL
ALT: 27 U/L (ref 0–44)
AST: 25 U/L (ref 15–41)
Albumin: 4 g/dL (ref 3.5–5.0)
Alkaline Phosphatase: 56 U/L (ref 38–126)
Anion gap: 6 (ref 5–15)
BUN: 10 mg/dL (ref 6–20)
CO2: 26 mmol/L (ref 22–32)
Calcium: 9.3 mg/dL (ref 8.9–10.3)
Chloride: 107 mmol/L (ref 98–111)
Creatinine, Ser: 1.29 mg/dL — ABNORMAL HIGH (ref 0.61–1.24)
GFR, Estimated: >60 mL/min (ref >60)
Glucose, Bld: 98 mg/dL (ref 70–99)
Potassium: 4.2 mmol/L (ref 3.5–5.1)
Sodium: 139 mmol/L (ref 135–145)
Total Bilirubin: 0.5 mg/dL (ref 0.3–1.2)
Total Protein: 7 g/dL (ref 6.5–8.1)

## 2022-01-03 LAB — HEMOGLOBIN A1C
Hgb A1c MFr Bld: 4.8 % (ref 4.8–5.6)
Mean Plasma Glucose: 91.06 mg/dL

## 2022-01-03 NOTE — Telephone Encounter (Signed)
  Follow up Call-     01/02/2022    9:13 AM 03/07/2021   10:46 AM  Call back number  Post procedure Call Back phone  # [phone]  Permission to leave phone message Yes Yes     Patient questions:  Do you have a fever, pain , or abdominal swelling? No. Pain Score  0 *  Have you tolerated food without any problems? Yes.    Have you been able to return to your normal activities? Yes.    Do you have any questions about your discharge instructions: Diet   No. Medications  No. Follow up visit  No.  Do you have questions or concerns about your Care? No.  Actions: * If pain score is 4 or above: No action needed, pain <4.

## 2022-01-08 NOTE — Progress Notes (Signed)
  Radiation Oncology         [phone]) [phone] ________________________________  Name: Jerry Allison MRN: [id number]  Date of Service: 01/08/22 DOB: 05-18-1976  Post Treatment Telephone Note  Diagnosis:   Stage IIB, cT3N0M0,  adenocarcinoma of the proximal rectum/sigmoid colon  Intent: Curative  Radiation Treatment Dates: 10/09/2021 through 11/15/2021 Site Technique Total Dose (Gy) Dose per Fx (Gy) Completed Fx Beam Energies  Rectum: Rectum 3D 45/45 1.8 25/25 10X, 15X  Rectum: Rectum_Bst 3D 5.4/5.4 1.8 3/3 10X, 15X   Narrative: The patient tolerated radiation therapy relatively well. He did have some dermatitis in the anorectal region. He's scheduled for surgery on 01/12/22.   Impression/Plan: 1. Stage IIB, cT3N0M0,  adenocarcinoma of the proximal rectum/sigmoid colon. The patient has been doing well since completion of radiotherapy. We discussed that we would be happy to continue to follow him as needed, but he will also continue to follow up with Dr. Burr Medico as well as Dr. Dema Severin in colorectal surgery.       Carola Rhine, PAC

## 2022-01-10 NOTE — Progress Notes (Addendum)
Called [facility] and spoke with Abigail Butts in office and made her aware that pt was not marked for ostomy at time of preop due to no order in epic.  Called to see if MD wanted pt to be marked.  MD in office .  Abigail Butts to ask MD and call me back. WEndy at [facility] called back and stated per DR Dema Severin he wanted pt marked prior to surgery.  Informed Abigail Butts to have MD place order in epic and preop nurse to notify Ostomy Nurses on 01/11/22 via email.  Osotmy nurse notified by email and responded. Email printed and placed with info to Short Stay for 01/12/22.  Osotmy nurse to be in Short stay in am of 8/18 for surgery.  Short Stay nurse, Helene Kelp called on 01/11/22 and made aware pt to be marked am of surgery for ostomy an daware that copy of email was sent to be placed on chart.

## 2022-01-11 ENCOUNTER — Ambulatory Visit: Payer: Self-pay | Admitting: Surgery

## 2022-01-11 NOTE — Anesthesia Preprocedure Evaluation (Addendum)
Anesthesia Evaluation  Patient identified by MRN, date of birth, ID band Patient awake    Reviewed: Allergy & Precautions, NPO status , Patient's Chart, lab work & pertinent test results  History of Anesthesia Complications Negative for: history of anesthetic complications  Airway Mallampati: II  TM Distance: >3 FB Neck ROM: Full    Dental no notable dental hx.    Pulmonary neg pulmonary ROS,    Pulmonary exam normal        Cardiovascular negative cardio ROS Normal cardiovascular exam     Neuro/Psych Anxiety Depression negative neurological ROS     GI/Hepatic GERD  Medicated and Controlled,(+)     substance abuse  marijuana use, Rectal ca   Endo/Other  negative endocrine ROS  Renal/GU Cr 1.29  negative genitourinary   Musculoskeletal negative musculoskeletal ROS (+)   Abdominal   Peds  Hematology negative hematology ROS (+)   Anesthesia Other Findings Day of surgery medications reviewed with patient.  Reproductive/Obstetrics negative OB ROS                            Anesthesia Physical Anesthesia Plan  ASA: 2  Anesthesia Plan: General   Post-op Pain Management: Tylenol PO (pre-op)* and Ketamine IV*   Induction: Intravenous  PONV Risk Score and Plan: 3 and Treatment may vary due to age or medical condition, Ondansetron, Dexamethasone and Midazolam  Airway Management Planned: Oral ETT  Additional Equipment: None  Intra-op Plan:   Post-operative Plan: Extubation in OR  Informed Consent: I have reviewed the patients History and Physical, chart, labs and discussed the procedure including the risks, benefits and alternatives for the proposed anesthesia with the patient or authorized representative who has indicated his/her understanding and acceptance.     Dental advisory given  Plan Discussed with: CRNA  Anesthesia Plan Comments:        Anesthesia Quick  Evaluation

## 2022-01-12 ENCOUNTER — Other Ambulatory Visit: Payer: Self-pay

## 2022-01-12 ENCOUNTER — Inpatient Hospital Stay (HOSPITAL_COMMUNITY): Payer: BC Managed Care – PPO | Admitting: Emergency Medicine

## 2022-01-12 ENCOUNTER — Encounter (HOSPITAL_COMMUNITY)
Admission: RE | Disposition: A | Discharge status: Home / Self Care | Payer: Self-pay | Source: Home / Self Care | Attending: Surgery

## 2022-01-12 ENCOUNTER — Inpatient Hospital Stay (HOSPITAL_COMMUNITY)
Admission: RE | Admit: 2022-01-12 | Discharge: 2022-01-15 | DRG: 331 | Disposition: A | Discharge status: Home / Self Care | Payer: BC Managed Care – PPO | Attending: Surgery | Admitting: Surgery

## 2022-01-12 ENCOUNTER — Encounter (HOSPITAL_COMMUNITY): Payer: Self-pay | Admitting: Surgery

## 2022-01-12 ENCOUNTER — Inpatient Hospital Stay (HOSPITAL_COMMUNITY): Payer: BC Managed Care – PPO | Admitting: Certified Registered Nurse Anesthetist

## 2022-01-12 DIAGNOSIS — Z01818 Encounter for other preprocedural examination: Secondary | ICD-10-CM

## 2022-01-12 DIAGNOSIS — Z8052 Family history of malignant neoplasm of bladder: Secondary | ICD-10-CM

## 2022-01-12 DIAGNOSIS — F419 Anxiety disorder, unspecified: Secondary | ICD-10-CM | POA: Diagnosis present

## 2022-01-12 DIAGNOSIS — K219 Gastro-esophageal reflux disease without esophagitis: Secondary | ICD-10-CM | POA: Diagnosis present

## 2022-01-12 DIAGNOSIS — Z91013 Allergy to seafood: Secondary | ICD-10-CM | POA: Diagnosis not present

## 2022-01-12 DIAGNOSIS — C20 Malignant neoplasm of rectum: Secondary | ICD-10-CM | POA: Diagnosis present

## 2022-01-12 DIAGNOSIS — Z9049 Acquired absence of other specified parts of digestive tract: Principal | ICD-10-CM

## 2022-01-12 DIAGNOSIS — D630 Anemia in neoplastic disease: Secondary | ICD-10-CM | POA: Diagnosis present

## 2022-01-12 DIAGNOSIS — Z932 Ileostomy status: Secondary | ICD-10-CM

## 2022-01-12 DIAGNOSIS — D500 Iron deficiency anemia secondary to blood loss (chronic): Secondary | ICD-10-CM | POA: Diagnosis present

## 2022-01-12 DIAGNOSIS — F32A Depression, unspecified: Secondary | ICD-10-CM | POA: Diagnosis present

## 2022-01-12 DIAGNOSIS — Z818 Family history of other mental and behavioral disorders: Secondary | ICD-10-CM

## 2022-01-12 DIAGNOSIS — F1290 Cannabis use, unspecified, uncomplicated: Secondary | ICD-10-CM | POA: Diagnosis present

## 2022-01-12 HISTORY — PX: FLEXIBLE SIGMOIDOSCOPY: SHX5431

## 2022-01-12 HISTORY — PX: ILEO LOOP DIVERSION: SHX1780

## 2022-01-12 HISTORY — PX: XI ROBOTIC ASSISTED LOWER ANTERIOR RESECTION: SHX6558

## 2022-01-12 LAB — ABO/RH: ABO/RH(D): A NEG

## 2022-01-12 SURGERY — RESECTION, RECTUM, LOW ANTERIOR, ROBOT-ASSISTED
Anesthesia: General

## 2022-01-12 MED ORDER — DEXMEDETOMIDINE (PRECEDEX) IN NS 20 MCG/5ML (4 MCG/ML) IV SYRINGE
PREFILLED_SYRINGE | INTRAVENOUS | Status: DC | PRN
  Administered 2022-01-12: 12 ug via INTRAVENOUS

## 2022-01-12 MED ORDER — 0.9 % SODIUM CHLORIDE (POUR BTL) OPTIME
TOPICAL | Status: DC | PRN
  Administered 2022-01-12: 2000 mL

## 2022-01-12 MED ORDER — BUPIVACAINE LIPOSOME 1.3 % IJ SUSP
INTRAMUSCULAR | Status: DC | PRN
  Administered 2022-01-12: 20 mL

## 2022-01-12 MED ORDER — KETAMINE HCL 10 MG/ML IJ SOLN
INTRAMUSCULAR | Status: DC | PRN
  Administered 2022-01-12: 35 mg via INTRAVENOUS

## 2022-01-12 MED ORDER — GLUCERNA SHAKE PO LIQD
237.0000 mL | Freq: Two times a day (BID) | ORAL | Status: DC
  Administered 2022-01-13 – 2022-01-14 (×3): 237 mL via ORAL
  Filled 2022-01-12 (×7): qty 237

## 2022-01-12 MED ORDER — BUPIVACAINE-EPINEPHRINE (PF) 0.5% -1:200000 IJ SOLN
INTRAMUSCULAR | Status: AC
Start: 2022-01-12 — End: ?
  Filled 2022-01-12: qty 30

## 2022-01-12 MED ORDER — GABAPENTIN 100 MG PO CAPS: 100.0000 mg | ORAL_CAPSULE | Freq: Every evening | ORAL | Status: DC | PRN

## 2022-01-12 MED ORDER — SUGAMMADEX SODIUM 200 MG/2ML IV SOLN
INTRAVENOUS | Status: DC | PRN
  Administered 2022-01-12: 160 mg via INTRAVENOUS

## 2022-01-12 MED ORDER — HEPARIN SODIUM (PORCINE) 5000 UNIT/ML IJ SOLN
5000.0000 [IU] | Freq: Three times a day (TID) | INTRAMUSCULAR | Status: DC
Start: 2022-01-12 — End: 2022-01-15
  Administered 2022-01-12 – 2022-01-15 (×10): 5000 [IU] via SUBCUTANEOUS
  Filled 2022-01-12 (×10): qty 1

## 2022-01-12 MED ORDER — DIPHENHYDRAMINE HCL 12.5 MG/5ML PO ELIX: 12.5000 mg | ORAL_SOLUTION | Freq: Four times a day (QID) | ORAL | Status: DC | PRN

## 2022-01-12 MED ORDER — LACTATED RINGERS IV SOLN: INTRAVENOUS | Status: DC

## 2022-01-12 MED ORDER — BUPIVACAINE-EPINEPHRINE 0.5% -1:200000 IJ SOLN
INTRAMUSCULAR | Status: DC | PRN
  Administered 2022-01-12: 30 mL

## 2022-01-12 MED ORDER — STERILE WATER FOR IRRIGATION IR SOLN
Status: DC | PRN
  Administered 2022-01-12: 500 mL

## 2022-01-12 MED ORDER — ACETAMINOPHEN 500 MG PO TABS: 1000.0000 mg | ORAL_TABLET | ORAL | Status: DC

## 2022-01-12 MED ORDER — NEOMYCIN SULFATE 500 MG PO TABS: 1000.0000 mg | ORAL_TABLET | ORAL | Status: DC

## 2022-01-12 MED ORDER — ROCURONIUM BROMIDE 10 MG/ML (PF) SYRINGE
PREFILLED_SYRINGE | INTRAVENOUS | Status: AC
Start: 2022-01-12 — End: ?
  Filled 2022-01-12: qty 10

## 2022-01-12 MED ORDER — HEPARIN SODIUM (PORCINE) 5000 UNIT/ML IJ SOLN
5000.0000 [IU] | Freq: Once | INTRAMUSCULAR | Status: AC
  Administered 2022-01-12: 5000 [IU] via SUBCUTANEOUS
  Filled 2022-01-12: qty 1

## 2022-01-12 MED ORDER — DROPERIDOL 2.5 MG/ML IJ SOLN: 0.6250 mg | Freq: Once | INTRAMUSCULAR | Status: DC | PRN

## 2022-01-12 MED ORDER — ONDANSETRON HCL 4 MG/2ML IJ SOLN: 4.0000 mg | Freq: Four times a day (QID) | INTRAMUSCULAR | Status: DC | PRN

## 2022-01-12 MED ORDER — POLYETHYLENE GLYCOL 3350 17 GM/SCOOP PO POWD: 1.0000 | Freq: Once | ORAL | Status: DC

## 2022-01-12 MED ORDER — FENTANYL CITRATE (PF) 100 MCG/2ML IJ SOLN
INTRAMUSCULAR | Status: AC
  Filled 2022-01-12: qty 2

## 2022-01-12 MED ORDER — PHENYLEPHRINE HCL-NACL 20-0.9 MG/250ML-% IV SOLN
INTRAVENOUS | Status: AC
  Filled 2022-01-12: qty 500

## 2022-01-12 MED ORDER — IBUPROFEN 400 MG PO TABS: 600.0000 mg | ORAL_TABLET | Freq: Four times a day (QID) | ORAL | Status: DC | PRN

## 2022-01-12 MED ORDER — DEXAMETHASONE SODIUM PHOSPHATE 10 MG/ML IJ SOLN
INTRAMUSCULAR | Status: AC
  Filled 2022-01-12: qty 1

## 2022-01-12 MED ORDER — ALVIMOPAN 12 MG PO CAPS
12.0000 mg | ORAL_CAPSULE | Freq: Two times a day (BID) | ORAL | Status: DC
  Administered 2022-01-13: 12 mg via ORAL
  Filled 2022-01-12: qty 1

## 2022-01-12 MED ORDER — ROCURONIUM BROMIDE 10 MG/ML (PF) SYRINGE
PREFILLED_SYRINGE | INTRAVENOUS | Status: DC | PRN
  Administered 2022-01-12: 40 mg via INTRAVENOUS
  Administered 2022-01-12: 60 mg via INTRAVENOUS
  Administered 2022-01-12: 20 mg via INTRAVENOUS

## 2022-01-12 MED ORDER — CHLORHEXIDINE GLUCONATE CLOTH 2 % EX PADS: 6.0000 | MEDICATED_PAD | Freq: Once | CUTANEOUS | Status: DC

## 2022-01-12 MED ORDER — SODIUM CHLORIDE 0.9 % IV SOLN
2.0000 g | INTRAVENOUS | Status: AC
  Administered 2022-01-12: 2 g via INTRAVENOUS
  Filled 2022-01-12: qty 2

## 2022-01-12 MED ORDER — BUPIVACAINE LIPOSOME 1.3 % IJ SUSP
INTRAMUSCULAR | Status: AC
Start: 2022-01-12 — End: ?
  Filled 2022-01-12: qty 20

## 2022-01-12 MED ORDER — ONDANSETRON HCL 4 MG PO TABS: 4.0000 mg | ORAL_TABLET | Freq: Four times a day (QID) | ORAL | Status: DC | PRN

## 2022-01-12 MED ORDER — FENTANYL CITRATE (PF) 100 MCG/2ML IJ SOLN
INTRAMUSCULAR | Status: DC | PRN
  Administered 2022-01-12 (×6): 50 ug via INTRAVENOUS

## 2022-01-12 MED ORDER — KETAMINE HCL 10 MG/ML IJ SOLN
INTRAMUSCULAR | Status: AC
  Filled 2022-01-12: qty 1

## 2022-01-12 MED ORDER — TRAMADOL HCL 50 MG PO TABS
50.0000 mg | ORAL_TABLET | Freq: Four times a day (QID) | ORAL | Status: DC | PRN
  Administered 2022-01-13 – 2022-01-15 (×6): 50 mg via ORAL
  Filled 2022-01-12 (×6): qty 1

## 2022-01-12 MED ORDER — PANTOPRAZOLE SODIUM 40 MG PO TBEC
40.0000 mg | DELAYED_RELEASE_TABLET | Freq: Every day | ORAL | Status: DC
  Administered 2022-01-12 – 2022-01-15 (×4): 40 mg via ORAL
  Filled 2022-01-12 (×4): qty 1

## 2022-01-12 MED ORDER — ACETAMINOPHEN 500 MG PO TABS
1000.0000 mg | ORAL_TABLET | Freq: Once | ORAL | Status: AC
  Administered 2022-01-12: 1000 mg via ORAL
  Filled 2022-01-12: qty 2

## 2022-01-12 MED ORDER — PROPOFOL 10 MG/ML IV BOLUS
INTRAVENOUS | Status: AC
  Filled 2022-01-12: qty 20

## 2022-01-12 MED ORDER — ESCITALOPRAM OXALATE 10 MG PO TABS
5.0000 mg | ORAL_TABLET | Freq: Every day | ORAL | Status: DC
  Administered 2022-01-12 – 2022-01-14 (×3): 5 mg via ORAL
  Filled 2022-01-12 (×3): qty 1

## 2022-01-12 MED ORDER — ONDANSETRON HCL 4 MG/2ML IJ SOLN
INTRAMUSCULAR | Status: AC
  Filled 2022-01-12: qty 2

## 2022-01-12 MED ORDER — EPHEDRINE SULFATE-NACL 50-0.9 MG/10ML-% IV SOSY
PREFILLED_SYRINGE | INTRAVENOUS | Status: DC | PRN
  Administered 2022-01-12: 10 mg via INTRAVENOUS

## 2022-01-12 MED ORDER — MIDAZOLAM HCL 5 MG/5ML IJ SOLN
INTRAMUSCULAR | Status: DC | PRN
  Administered 2022-01-12: 2 mg via INTRAVENOUS

## 2022-01-12 MED ORDER — CHLORHEXIDINE GLUCONATE 0.12 % MT SOLN
15.0000 mL | Freq: Once | OROMUCOSAL | Status: AC
  Administered 2022-01-12: 15 mL via OROMUCOSAL

## 2022-01-12 MED ORDER — ORAL CARE MOUTH RINSE: 15.0000 mL | Freq: Once | OROMUCOSAL | Status: AC

## 2022-01-12 MED ORDER — LIDOCAINE HCL (PF) 2 % IJ SOLN
INTRAMUSCULAR | Status: DC | PRN
  Administered 2022-01-12: 1.5 mg/kg/h via INTRADERMAL

## 2022-01-12 MED ORDER — DEXAMETHASONE SODIUM PHOSPHATE 10 MG/ML IJ SOLN
INTRAMUSCULAR | Status: DC | PRN
  Administered 2022-01-12: 8 mg via INTRAVENOUS

## 2022-01-12 MED ORDER — METRONIDAZOLE 500 MG PO TABS: 1000.0000 mg | ORAL_TABLET | ORAL | Status: DC

## 2022-01-12 MED ORDER — PROPOFOL 10 MG/ML IV BOLUS
INTRAVENOUS | Status: DC | PRN
  Administered 2022-01-12: 150 mg via INTRAVENOUS

## 2022-01-12 MED ORDER — ACETAMINOPHEN 500 MG PO TABS
1000.0000 mg | ORAL_TABLET | Freq: Four times a day (QID) | ORAL | Status: DC
  Administered 2022-01-12 – 2022-01-15 (×12): 1000 mg via ORAL
  Filled 2022-01-12 (×12): qty 2

## 2022-01-12 MED ORDER — LIDOCAINE HCL 2 % IJ SOLN
INTRAMUSCULAR | Status: AC
  Filled 2022-01-12: qty 20

## 2022-01-12 MED ORDER — HYDRALAZINE HCL 20 MG/ML IJ SOLN: 10.0000 mg | INTRAMUSCULAR | Status: DC | PRN

## 2022-01-12 MED ORDER — ENSURE PRE-SURGERY PO LIQD: 296.0000 mL | Freq: Once | ORAL | Status: DC

## 2022-01-12 MED ORDER — ONDANSETRON HCL 4 MG/2ML IJ SOLN
INTRAMUSCULAR | Status: DC | PRN
  Administered 2022-01-12: 4 mg via INTRAVENOUS

## 2022-01-12 MED ORDER — SIMETHICONE 80 MG PO CHEW: 40.0000 mg | CHEWABLE_TABLET | Freq: Four times a day (QID) | ORAL | Status: DC | PRN

## 2022-01-12 MED ORDER — HYDROMORPHONE HCL 1 MG/ML IJ SOLN
0.5000 mg | INTRAMUSCULAR | Status: DC | PRN
  Administered 2022-01-12 – 2022-01-14 (×5): 0.5 mg via INTRAVENOUS
  Filled 2022-01-12 (×7): qty 0.5

## 2022-01-12 MED ORDER — ALVIMOPAN 12 MG PO CAPS
12.0000 mg | ORAL_CAPSULE | ORAL | Status: AC
  Administered 2022-01-12: 12 mg via ORAL
  Filled 2022-01-12: qty 1

## 2022-01-12 MED ORDER — HYDROMORPHONE HCL 1 MG/ML IJ SOLN: 0.2500 mg | INTRAMUSCULAR | Status: DC | PRN

## 2022-01-12 MED ORDER — MIDAZOLAM HCL 2 MG/2ML IJ SOLN
INTRAMUSCULAR | Status: AC
  Filled 2022-01-12: qty 2

## 2022-01-12 MED ORDER — ROCURONIUM BROMIDE 10 MG/ML (PF) SYRINGE
PREFILLED_SYRINGE | INTRAVENOUS | Status: AC
  Filled 2022-01-12: qty 10

## 2022-01-12 MED ORDER — BUPIVACAINE LIPOSOME 1.3 % IJ SUSP: 20.0000 mL | Freq: Once | INTRAMUSCULAR | Status: DC

## 2022-01-12 MED ORDER — ENSURE PRE-SURGERY PO LIQD: 592.0000 mL | Freq: Once | ORAL | Status: DC

## 2022-01-12 MED ORDER — DIPHENHYDRAMINE HCL 50 MG/ML IJ SOLN: 12.5000 mg | Freq: Four times a day (QID) | INTRAMUSCULAR | Status: DC | PRN

## 2022-01-12 MED ORDER — BISACODYL 5 MG PO TBEC: 20.0000 mg | DELAYED_RELEASE_TABLET | Freq: Once | ORAL | Status: DC

## 2022-01-12 MED ORDER — LIDOCAINE 2% (20 MG/ML) 5 ML SYRINGE
INTRAMUSCULAR | Status: DC | PRN
  Administered 2022-01-12: 100 mg via INTRAVENOUS

## 2022-01-12 MED ORDER — ALUM & MAG HYDROXIDE-SIMETH 200-200-20 MG/5ML PO SUSP: 30.0000 mL | Freq: Four times a day (QID) | ORAL | Status: DC | PRN

## 2022-01-12 SURGICAL SUPPLY — 120 items
APPLIER CLIP 5 13 M/L LIGAMAX5 (MISCELLANEOUS)
APPLIER CLIP ROT 10 11.4 M/L (STAPLE)
BAG COUNTER SPONGE SURGICOUNT (BAG) IMPLANT
BLADE EXTENDED COATED 6.5IN (ELECTRODE) ×1 IMPLANT
CANNULA REDUC XI 12-8 STAPL (CANNULA) ×1
CANNULA REDUCER 12-8 DVNC XI (CANNULA) ×1 IMPLANT
CELLS DAT CNTRL 66122 CELL SVR (MISCELLANEOUS) IMPLANT
CHLORAPREP W/TINT 26 (MISCELLANEOUS) ×1 IMPLANT
CLIP APPLIE 5 13 M/L LIGAMAX5 (MISCELLANEOUS) IMPLANT
CLIP APPLIE ROT 10 11.4 M/L (STAPLE) IMPLANT
CLIP LIGATING HEM O LOK PURPLE (MISCELLANEOUS) IMPLANT
CLIP LIGATING HEMO O LOK GREEN (MISCELLANEOUS) IMPLANT
COVER SURGICAL LIGHT HANDLE (MISCELLANEOUS) ×2 IMPLANT
COVER TIP SHEARS 8 DVNC (MISCELLANEOUS) ×1 IMPLANT
COVER TIP SHEARS 8MM DA VINCI (MISCELLANEOUS) ×1
DEFOGGER SCOPE WARMER CLEARIFY (MISCELLANEOUS) ×1 IMPLANT
DERMABOND ADVANCED (GAUZE/BANDAGES/DRESSINGS) ×2
DERMABOND ADVANCED .7 DNX12 (GAUZE/BANDAGES/DRESSINGS) IMPLANT
DEVICE TROCAR PUNCTURE CLOSURE (ENDOMECHANICALS) IMPLANT
DRAIN CHANNEL 19F RND (DRAIN) ×1 IMPLANT
DRAPE ARM DVNC X/XI (DISPOSABLE) ×4 IMPLANT
DRAPE COLUMN DVNC XI (DISPOSABLE) ×1 IMPLANT
DRAPE DA VINCI XI ARM (DISPOSABLE) ×4
DRAPE DA VINCI XI COLUMN (DISPOSABLE) ×1
DRAPE SURG IRRIG POUCH 19X23 (DRAPES) ×1 IMPLANT
DRSG OPSITE POSTOP 4X10 (GAUZE/BANDAGES/DRESSINGS) IMPLANT
DRSG OPSITE POSTOP 4X6 (GAUZE/BANDAGES/DRESSINGS) IMPLANT
DRSG OPSITE POSTOP 4X8 (GAUZE/BANDAGES/DRESSINGS) IMPLANT
DRSG TEGADERM 2-3/8X2-3/4 SM (GAUZE/BANDAGES/DRESSINGS) ×5 IMPLANT
DRSG TEGADERM 4X4.75 (GAUZE/BANDAGES/DRESSINGS) ×1 IMPLANT
ELECT REM PT RETURN 15FT ADLT (MISCELLANEOUS) ×1 IMPLANT
ENDOLOOP SUT PDS II  0 18 (SUTURE)
ENDOLOOP SUT PDS II 0 18 (SUTURE) IMPLANT
EVACUATOR SILICONE 100CC (DRAIN) ×1 IMPLANT
GAUZE SPONGE 2X2 8PLY STRL LF (GAUZE/BANDAGES/DRESSINGS) ×1 IMPLANT
GAUZE SPONGE 4X4 12PLY STRL (GAUZE/BANDAGES/DRESSINGS) IMPLANT
GLOVE BIO SURGEON STRL SZ7.5 (GLOVE) ×3 IMPLANT
GLOVE INDICATOR 8.0 STRL GRN (GLOVE) ×3 IMPLANT
GOWN SRG XL LVL 4 BRTHBL STRL (GOWNS) ×1 IMPLANT
GOWN STRL NON-REIN XL LVL4 (GOWNS) ×1
GOWN STRL REUS W/ TWL XL LVL3 (GOWN DISPOSABLE) ×5 IMPLANT
GOWN STRL REUS W/TWL XL LVL3 (GOWN DISPOSABLE) ×5
GRASPER SUT TROCAR 14GX15 (MISCELLANEOUS) IMPLANT
HOLDER FOLEY CATH W/STRAP (MISCELLANEOUS) ×1 IMPLANT
IRRIG SUCT STRYKERFLOW 2 WTIP (MISCELLANEOUS) ×1
IRRIGATION SUCT STRKRFLW 2 WTP (MISCELLANEOUS) ×1 IMPLANT
KIT PROCEDURE DA VINCI SI (MISCELLANEOUS)
KIT PROCEDURE DVNC SI (MISCELLANEOUS) IMPLANT
KIT TURNOVER KIT A (KITS) IMPLANT
NDL INSUFFLATION 14GA 120MM (NEEDLE) ×1 IMPLANT
NEEDLE INSUFFLATION 14GA 120MM (NEEDLE) ×1 IMPLANT
PACK CARDIOVASCULAR III (CUSTOM PROCEDURE TRAY) ×1 IMPLANT
PACK COLON (CUSTOM PROCEDURE TRAY) ×1 IMPLANT
PAD POSITIONING PINK XL (MISCELLANEOUS) ×1 IMPLANT
PENCIL SMOKE EVACUATOR (MISCELLANEOUS) IMPLANT
PROTECTOR NERVE ULNAR (MISCELLANEOUS) ×2 IMPLANT
RELOAD STAPLE 45 3.5 BLU DVNC (STAPLE) IMPLANT
RELOAD STAPLE 45 4.3 GRN DVNC (STAPLE) IMPLANT
RELOAD STAPLE 60 3.5 BLU DVNC (STAPLE) IMPLANT
RELOAD STAPLE 60 4.3 GRN DVNC (STAPLE) IMPLANT
RELOAD STAPLER 3.5X45 BLU DVNC (STAPLE) IMPLANT
RELOAD STAPLER 3.5X60 BLU DVNC (STAPLE) IMPLANT
RELOAD STAPLER 4.3X45 GRN DVNC (STAPLE) IMPLANT
RELOAD STAPLER 4.3X60 GRN DVNC (STAPLE) ×1 IMPLANT
RETRACTOR WND ALEXIS 18 MED (MISCELLANEOUS) IMPLANT
RTRCTR WOUND ALEXIS 18CM MED (MISCELLANEOUS)
SCISSORS LAP 5X35 DISP (ENDOMECHANICALS) IMPLANT
SEAL CANN UNIV 5-8 DVNC XI (MISCELLANEOUS) ×4 IMPLANT
SEAL XI 5MM-8MM UNIVERSAL (MISCELLANEOUS) ×4
SEALER VESSEL DA VINCI XI (MISCELLANEOUS) ×1
SEALER VESSEL EXT DVNC XI (MISCELLANEOUS) ×1 IMPLANT
SLEEVE ADV FIXATION 5X100MM (TROCAR) IMPLANT
SOLUTION ELECTROLUBE (MISCELLANEOUS) ×1 IMPLANT
SPIKE FLUID TRANSFER (MISCELLANEOUS) ×1 IMPLANT
SPONGE GAUZE 2X2 STER 10/PKG (GAUZE/BANDAGES/DRESSINGS) ×1
STAPLER 60 DA VINCI SURE FORM (STAPLE) ×1
STAPLER 60 SUREFORM DVNC (STAPLE) IMPLANT
STAPLER CANNULA SEAL DVNC XI (STAPLE) ×1 IMPLANT
STAPLER CANNULA SEAL XI (STAPLE) ×1
STAPLER ECHELON POWER CIR 29 (STAPLE) IMPLANT
STAPLER ECHELON POWER CIR 31 (STAPLE) IMPLANT
STAPLER RELOAD 3.5X45 BLU DVNC (STAPLE)
STAPLER RELOAD 3.5X45 BLUE (STAPLE)
STAPLER RELOAD 3.5X60 BLU DVNC (STAPLE)
STAPLER RELOAD 3.5X60 BLUE (STAPLE)
STAPLER RELOAD 4.3X45 GREEN (STAPLE)
STAPLER RELOAD 4.3X45 GRN DVNC (STAPLE)
STAPLER RELOAD 4.3X60 GREEN (STAPLE) ×1
STAPLER RELOAD 4.3X60 GRN DVNC (STAPLE) ×1
STOPCOCK 4 WAY LG BORE MALE ST (IV SETS) ×2 IMPLANT
SURGILUBE 2OZ TUBE FLIPTOP (MISCELLANEOUS) ×1 IMPLANT
SUT MNCRL AB 4-0 PS2 18 (SUTURE) ×1 IMPLANT
SUT PDS AB 1 CT1 27 (SUTURE) IMPLANT
SUT PDS AB 1 TP1 96 (SUTURE) IMPLANT
SUT PROLENE 0 CT 2 (SUTURE) IMPLANT
SUT PROLENE 2 0 KS (SUTURE) ×1 IMPLANT
SUT PROLENE 2 0 SH DA (SUTURE) IMPLANT
SUT SILK 2 0 (SUTURE)
SUT SILK 2 0 SH CR/8 (SUTURE) IMPLANT
SUT SILK 2-0 18XBRD TIE 12 (SUTURE) IMPLANT
SUT SILK 3 0 (SUTURE) ×1
SUT SILK 3 0 SH CR/8 (SUTURE) ×1 IMPLANT
SUT SILK 3-0 18XBRD TIE 12 (SUTURE) ×1 IMPLANT
SUT V-LOC BARB 180 2/0GR6 GS22 (SUTURE)
SUT VIC AB 3-0 SH 18 (SUTURE) IMPLANT
SUT VIC AB 3-0 SH 27 (SUTURE)
SUT VIC AB 3-0 SH 27XBRD (SUTURE) IMPLANT
SUT VICRYL 0 UR6 27IN ABS (SUTURE) ×1 IMPLANT
SUTURE V-LC BRB 180 2/0GR6GS22 (SUTURE) IMPLANT
SYR 10ML LL (SYRINGE) ×1 IMPLANT
SYS LAPSCP GELPORT 120MM (MISCELLANEOUS)
SYS WOUND ALEXIS 18CM MED (MISCELLANEOUS) ×1
SYSTEM LAPSCP GELPORT 120MM (MISCELLANEOUS) IMPLANT
SYSTEM WOUND ALEXIS 18CM MED (MISCELLANEOUS) ×1 IMPLANT
TAPE UMBILICAL 1/8 X36 TWILL (MISCELLANEOUS) ×1 IMPLANT
TOWEL OR NON WOVEN STRL DISP B (DISPOSABLE) ×1 IMPLANT
TRAY FOLEY MTR SLVR 16FR STAT (SET/KITS/TRAYS/PACK) ×1 IMPLANT
TROCAR ADV FIXATION 5X100MM (TROCAR) ×1 IMPLANT
TUBING CONNECTING 10 (TUBING) ×2 IMPLANT
TUBING INSUFFLATION 10FT LAP (TUBING) ×1 IMPLANT

## 2022-01-12 NOTE — Consult Note (Addendum)
[place] Nurse requested for preoperative stoma site marking  Discussed surgical procedure and stoma creation with patient and wife.  Explained role of the [place] nurse team.  Provided the patient with educational booklet and provided samples of pouching options. Answered patient's questions.   Examined patient lying and sitting upright in the OR holding area, in order to place the marking in the patient's visual field, away from any creases or abdominal contour issues and within the rectus muscle.  Attempted to mark below the patient's belt line, but this was not possible, since a crease occurs lower on the abd when he is sitting upright and this should be avoided if possible.    Marked for colostomy in the LLQ  _7___ cm to the left of the umbilicus and __8.8__FO above the umbilicus.  Marked for ileostomy in the RLQ  __7__cm to the right of the umbilicus and  _2.7___ cm above the umbilicus.  Patient's abdomen cleansed with CHG wipes at site markings, allowed to air dry prior to marking. Pt plans for OR this morning.  [place] Nurse team will follow up with patient after surgery for continued ostomy care and teaching on Monday.  Thank-you,  Julien Girt MSN, [place], [place], [place], [place]

## 2022-01-12 NOTE — Consult Note (Addendum)
Consult requested for new ileostomy; Pt had surgery performed today. The [place] team is not available on weekends;  we will follow-up with patient for ostomy care and teaching session on Monday as requested.  Thank-you,  Julien Girt MSN, [place], [place], [place], [place]

## 2022-01-12 NOTE — H&P (Signed)
CC: Here today for surgery  HPI: Jerry Allison is an 46 y.o. male with couple month hx of BRBPR, whom is seen in the office today as a referral by Dr. Burr Allison for evaluation of newly diagnosed rectal cancer.   He underwent colonoscopy 03/07/2021 with Dr. Candis Allison where he was found to have a malignant appearing partially obstructing tumor in the proximal rectum. Biopsied, tattooed. 3 mm polyp in the distal rectum removed. Remainder of the colon was normal. Tattoo was noted to be just proximal to the mass.  Pathology demonstrated adenocarcinoma. Polyp returned as a tubular adenoma.  CT chest/abdomen/pelvis 03/09/2021 showed asymmetrical thickening in the proximal/mid rectum. No evidence of metastatic disease.  He reports his symptoms preceding this were primarily that of some crampy lower abdominal pains for few months as well as hematochezia.  MRI 03/27/21 - "1. Low sigmoid primary, greater than 15 cm from the anal verge, as detailed above. Poorly delineated secondary to underdistention in this region. 2. No evidence of pelvic nodal metastasis. 3. Trace perisigmoid fluid, new since the prior CT."  INTERVAL HX uT3cN0M0 stage II Dr. Ardis Allison - favored rectal primary. CAPOX 05/10/21 initiated. Subsequent chemoXRT 10/09/21, finishing ~11/15/21. He denies any complaints today. He has been doing reasonably well on treatment. He denies any nausea, vomiting, abdominal pain or distention. No urgency or urinary issues either.  PMH: Denies  PSH: Denies  FHx: Denies any known family history of colorectal, breast, endometrial or ovarian cancer  Social Hx: Denies use of tobacco/illicit drug. Social EtOH use. He works in a [employer]. He is here today with his wife. He reports they do have children.  Past Medical History:  Diagnosis Date   Anxiety    Depression    Family history of bladder cancer    Rectal cancer (Jerry Allison) 02/2021    Past Surgical History:  Procedure Laterality Date   COLONOSCOPY      EUS N/A 04/27/2021   Procedure: LOWER ENDOSCOPIC ULTRASOUND (EUS);  Surgeon: Jerry Banister, MD;  Location: [facility];  Service: Endoscopy;  Laterality: N/A;   FLEXIBLE SIGMOIDOSCOPY N/A 04/27/2021   Procedure: FLEXIBLE SIGMOIDOSCOPY;  Surgeon: Jerry Banister, MD;  Location: [facility] ENDOSCOPY;  Service: Endoscopy;  Laterality: N/A;   NO PAST SURGERIES      Family History  Problem Relation Age of Onset   Anxiety disorder Father    Healthy Brother    Bladder Cancer Maternal Grandmother 5   Healthy Half-Sister        paternal half   Colon cancer Neg Hx    Esophageal cancer Neg Hx    Stomach cancer Neg Hx    Pancreatic cancer Neg Hx    Liver disease Neg Hx    Inflammatory bowel disease Neg Hx     Social:  reports that he has never smoked. He has never used smokeless tobacco. He reports current alcohol use. He reports current drug use. Drug: Marijuana.  Allergies:  Allergies  Allergen Reactions   Shellfish Allergy Anaphylaxis    Medications: I have reviewed the patient's current medications.  No results found for this or any previous visit (from the past 48 hour(s)).  No results found.  ROS - all of the below systems have been reviewed with the patient and positives are indicated with bold text General: chills, fever or night sweats Eyes: blurry vision or double vision ENT: epistaxis or sore throat Allergy/Immunology: itchy/watery eyes or nasal congestion Hematologic/Lymphatic: bleeding problems, blood clots or swollen lymph nodes Endocrine: temperature intolerance  or unexpected weight changes Breast: new or changing breast lumps or nipple discharge Resp: cough, shortness of breath, or wheezing CV: chest pain or dyspnea on exertion GI: as per HPI GU: dysuria, trouble voiding, or hematuria MSK: joint pain or joint stiffness Neuro: TIA or stroke symptoms Derm: pruritus and skin lesion changes Psych: anxiety and depression  PE Blood pressure (!) 138/92, pulse 82,  temperature 98.1 F (36.7 C), temperature source Oral, resp. rate 16, weight 76.2 kg, SpO2 99 %. Constitutional: NAD; conversant; no deformities Eyes: Moist conjunctiva; no lid lag; anicteric; PERRL Neck: Trachea midline; no thyromegaly Lungs: Normal respiratory effort; no tactile fremitus CV: RRR; no pitting edema GI: Abd soft, NT/ND; no palpable hepatosplenomegaly MSK: Normal range of motion of extremities Psychiatric: Appropriate affect; alert and oriented x3  No results found for this or any previous visit (from the past 48 hour(s)).  No results found.  A/P: Jerry Allison is an 46 y.o. male with no medical hx here for surgery for mid rectal cancer  CEA 1.7 CT CAP 02/2021 - no evidence of metastatic dz MRI Pelvis 03/27/21 - 1. Low sigmoid primary, greater than 15 cm from the anal verge, as detailed above. Poorly delineated secondary to underdistention in this region. 2. No evidence of pelvic nodal metastasis. 3. Trace perisigmoid fluid, new since the prior CT.  Genetics negative  TNT - chemo completed; neoadjuvanct cXRT completion date 11/15/21  -CT CAP 11/14/21 - 1. Although poorly evaluated on this examination there appears to be slightly decreased focal wall thickening of the distal sigmoid/proximal rectum which may reflect improvement in patient's known primary tumor. 2. No metastatic disease in the chest, abdomen or pelvis. 3. Similar trace fluid/stranding in the sigmoid mesentery.  MR Pelvis 01/01/22 -  1. Response to therapy of low sigmoid or rectosigmoid junction primary. Focal area of underdistention and mild wall thickening may all be treatment related. No well-defined residual mass. 2. No pelvic adenopathy.  A severe stenosis measuring 3 mm (inner diameter) was found in the recto-sigmoid colon, located approximately 15 cm from the anal verge. This was non-traversable. Biopsies were taken with a cold forceps for histology. Estimated blood loss was  minimal.  Also met with Dr. Morton Allison at [facility] for second opinion  We have all agreed surgery is the next step for various reasons including the intrinsic stenosis  He is ready to proceed and agrees with the plan  -The anatomy and physiology of the GI tract was reviewed with the patient as it pertains to his current rectal cancer. We discussed pathophysiology of rectal cancer. Given that most of this mass is in the more proximal rectum and were unable to palpate the full extent of it, discussed that watch and wait type approaches are generally not recommended in these patients. -We have again discussed robotic assisted low anterior resection, diverting loop ileostomy, flexible sigmoidoscopy. We discussed possible scenarios where open surgery could be necessary. We spent time discussing what a loop ileostomy is and its perceived benefits. We worked to help him understand expectations as well. His wife has had an ileostomy in the past as well -The planned procedures, material risks (including, but not limited to, pain, bleeding, infection, scarring, need for blood transfusion, damage to surrounding structures- blood vessels/nerves/viscus/organs, damage to ureter, urine leak, leak from anastomosis, need for additional procedures, sexual dysfunction, scenarios where a stoma could even be permanent, worsening of pre-existing medical conditions, hernia, recurrence of cancer despite surgery, pneumonia, heart attack, stroke, death) benefits and alternatives to  surgery were discussed at length. The patient's questions were answered to his satisfaction, he voiced understanding and elected to proceed with surgery. Additionally, we discussed typical postoperative expectations and the recovery process.  Nadeen Landau, [place] [facility], [place]

## 2022-01-12 NOTE — Op Note (Signed)
PATIENT: Jerry Allison  46 y.o. male  Patient Care Team: Derinda Late, MD as PCP - General (Family Medicine) [name], MD as Consulting Physician (Gastroenterology) [name], MD as Consulting Physician (Hematology) Nadeen Landau (Inactive) Kyung Rudd, MD as Consulting Physician (Radiation Oncology) [name], RN as Oncology Nurse Navigator (Oncology) Cheryll Cockayne, MD as Referring Physician (Surgical Oncology)  PREOP DIAGNOSIS: RECTAL CANCER  POSTOP DIAGNOSIS: RECTAL CANCER  PROCEDURE:  Robotic assisted low anterior resection with double stapled colorectal anastomosis with Diverting loop ileostomy Diagnostic flexible sigmoidoscopy to localize lesion Bilateral transversus abdominus plane (TAP) blocks  SURGEON: Sharon Mt. Chudney Scheffler, MD  ASSISTANT: Michael Boston, MD  ANESTHESIA: General endotracheal  EBL: 50 mL Total I/O In: 1100 [I.V.:1000; IV Piggyback:100] Out: 175 [Urine:125; Blood:50]  DRAINS: None  SPECIMEN:  Rectosigmoid colon, opened and proximal Distal anastomotic donut  COUNTS: Sponge, needle and instrument counts were reported correct x2  FINDINGS: No evident metastatic disease on visceral nor parietal peritoneum or liver surface. Tattoo in the proximal rectum and in the mid rectum.  No obvious mass.  Diagnostic flexible sigmoidoscopy was therefore carried out to localize the intraluminal location of the lesion.  There is a relatively dense stricture between the upper valve of [name] in the mid valve of [name].  We are able to localize an approximate 5 cm distal margin and marked this endoscopically.  Following this, a low anterior resection was carried out with a double stapled colorectal anastomosis. A well perfused, tension free, hemostatic, air tight 31 mm EEA colorectal anastomosis fashioned 8 cm from the anal verge by flexible sigmoidoscopy.   NARRATIVE: Informed consent was verified. The patient was taken to the operating room,  placed supine on the operating table and SCD's were applied. General endotracheal anesthesia was induced without difficulty. He was then positioned in the lithotomy position with Allen stirrups.  Pressure points were evaluated and padded.  A foley catheter was then placed by nursing under sterile conditions. Hair on the abdomen was clipped.  He was secured to the operating table. The abdomen was then prepped and draped in the standard sterile fashion. Surgical timeout was called indicating the correct patient, procedure, positioning and need for preoperative antibiotics.   An OG tube was placed by anesthesia and confirmed to be to suction.  At Palmer's point, a stab incision was created and the Veress needle was introduced into the peritoneal cavity on the first attempt.  Intraperitoneal location was confirmed by the aspiration and saline drop test.  Pneumoperitoneum was established to a maximum pressure of 15 mmHg using CO2.  Following this, the abdomen was marked for planned trocar sites.  Just to the right and cephalad to the umbilicus, an 8 mm incision was created and an 8 mm blunt tipped robotic trocar was cautiously placed into the peritoneal cavity.  The laparoscope was inserted and demonstrated no evidence of trocar site nor Veress needle site complications.  The Veress needle was removed.  Bilateral transversus abdominis plane blocks were then created using a dilute mixture of Exparel with Marcaine.  3 additional 8 mm robotic trochars were placed under direct visualization roughly in a line extending from the right ASIS towards the left upper quadrant. The bladder was inspected and noted to be at/below the pubic symphysis. An additional 5 mm assist port was placed in the right lateral abdomen under direct visualization.  The abdomen was surveyed and there was normal-appearing peritoneal surfaces.  The surface of the liver is normal in appearance.  He was positioned in Trendelenburg with the left side  tilted slightly up.  Small bowel was carefully retracted out of the pelvis.  The robot was then docked and I went to the console.   The sigmoid colon was readily identified.  This is followed out of the pelvis at which point the proximalmost tattoo was identified.  The distal tattoo is apparent just above the peritoneal reflection.  There is no obvious lesion or palpable mass at these locations.  We wanted to be sure that we were able to mark where the distal target of our planned low anterior resection was.  With the colon clamped, went below and passed a flexible sigmoidoscope under direct visualization through the anus.  The rectum was insufflated and we are able to identify a relatively dense stricture that appears just above the second valve of [name].  We were able to mark an area approximately 5 cm distal to this is at roughly 8 cm.  Pneumo rectum is then evacuated.  I went back to the console.   Attachments of the sigmoid colon were taken down from the intersigmoid fossa.  The rectosigmoid colon was grasped and elevated anteriorly.  Beginning with a medial to lateral approach, the peritoneum overlying the presacral space was incised.  The TME plane was readily gained working in a plane between the fascia propria of the rectum and the presacral fascia.  Hypogastric nerves were seen going along the the presacral fascia and were protected free of injury.  Working more proximally, the mesorectum and sigmoid mesentery were carefully mobilized off of the peritoneum.  The left ureter was identified and protected free of injury.  The left gonadal vessels were identified and protected.  These were both swept "down."  The superior hemorrhoidal and IMA pedicles were identified. Further mesocolon was mobilized proximally staying in this plane between the retroperitoneum proper and the mesocolon. Attention was then turned to the lateral portion of dissection.  The sigmoid colon was then retracted to the right.  The  sigmoid colon was fully mobilized. The descending colon was mobilized by incising the Deshanda Molitor line of [place].  This was done all the way up to the level of the splenic flexure.  The associated mesocolon was also mobilized medially.  The left ureter again was confirmed to be well away from the vasculature which had been dissected medially.  The rectosigmoid colon was elevated anteriorly. The left ureter was re-identified. The IMA was clear of this. The IMA was then divided with the vessel sealer just beyond its origin from the aorta in a "high" manner. The stump was inspected and noted to be completely hemostatic with a good seal.  The mesentery was divided out to the point of planned proximal division on the sigmoid mesocolon.  We then turned our attention back to the proctectomy portion of the procedure.  Continuing our posterior dissection first, we worked between the fascia propria the rectum and the presacral fascia.  This was done all the way down to the level of our planned distal division.  This is just above the peritoneal reflection.  The dissection is then commenced laterally and completed anteriorly.  At the level of our planned distal transection, the mesorectum is cleared circumferentially for a mesorectal specific/tumor specific mesorectal excision.    Our 8 mm port to have been placed at the ileostomy site was upsized to a 12 mm trocar.  Using a 60 mm sure form robotic green load stapler, the rectum is divided at  the level of our mesenteric clearing.  This was done with a single firing of the stapler.  The staple line is inspected and noted to be intact and with well-formed staples.  The stump was intact and healthy in appearance.  We elected not to perform an ICG perfusion test given his anaphylactic/shellfish allergy.  There is however a healthy mesorectal envelope with a well-perfused appearing rectum.  There is also a visible pulse in the mesentery going out to our cleared point of proximal  division on the sigmoid colon.  The sigmoid is pink in appearance.   Attention was turned to the extracorporeal portion of the procedure.  The robot was undocked.  I scrubbed back in. A Pfannenstiel incision was created approximately 3 fingerbreadths above the pubic symphysis.  The rectus fascia was incised and then elevated.  The rectus muscle was mobilized free of the overlying fascia.  The peritoneum was incised in the midline well above the location of the bladder.  An Adena wound protector was placed.  Towels were placed around the field.  The divided colon was passed through the wound protector.  The point of proximal division was identified and was again on a healthy segment of supple colon with a palpable pulse in the mesentery. This was pink in color.  A pursestring device was applied.  A 2-0 Prolene on a Keith needle was passed.  The colon was divided and passed off with the open end being proximal.  EEA sizers were then introduced and a 31 mm EEA selected.  "Belt loops" consisting of 3-0 silk were placed around the pursestring suture line.  The anvil was placed and the pursestring tied.  A small amount of fat was cleared from the planned anastomosis and no diverticula were apparent within this.  This was placed back into the abdomen and a cap placed over the wound protector port site.  Pneumoperitoneum was reestablished.  I then went below to pass the stapler.  My partner remained above.  EEA sizers were cautiously introduced via the anus and advanced under direct visualization.  The stapler was passed and the spike deployed just anterior to the staple line.  The components were then mated.  Orientation was confirmed such that there is no twisting of the colon nor small bowel underneath the mesenteric defect. Care was taken to ensure no other structures were incorporated within this either.  The stapler was then closed, held, and fired. This was then removed. The donuts were inspected and noted to be  complete.  The colon proximal to the anastomosis was then gently occluded. The pelvis was filled with sterile irrigation. Under direct visualization, I passed a flexible sigmoidoscope.  The anastomosis was under water.  With good distention of the anastomosis there was no air leak. The anastomosis pink in appearance.  This is located at 8 cm from the anal verge by flexible sigmoidoscopy.  It is hemostatic.  Additionally, looking from above, there is no tension on the colon or mesentery.  Sigmoidoscope was withdrawn.  I scrubbed back in. Irrigation was evacuated from the pelvis.  The abdomen and pelvis are surveyed and noted to be completely hemostatic without any apparent injury.   The cecum is identified.  The terminal ileum was identified.  We worked back approximately 20 cm from the ileocecal valve for the planned location of her diverting loop ileostomy.  This was then externalized through our 12 mm trocar site after slightly widening the fascia.  A Kary Kos is placed to  maintain orientation.  We reinspected orientation intracorporeally and confirmed that there is no twisting of the ileum and at the proximal end is up.  Under direct visualization, all trochars are removed.  The Alden wound protector was removed.  Gowns/gloves are changed and a fresh set of clean instruments utilized. Additional sterile drapes were placed around the field.   The Pfannenstiel peritoneum was closed with a running 2-0 Vicryl suture.  The rectus fascia was then closed using 2 running #1 PDS sutures.  The fascia was then palpated and noted to be completely closed.  Additional anesthetic was infiltrated at the Pfannenstiel site.  Sponge, needle, and instrument counts were reported correct x2. 4-0 Monocryl subcuticular suture was used to close the skin of all incision sites.  Dermabond was placed over all incisions.  The ileostomy was then matured in a loop configuration with by Brooking the stoma using 3-0 Vicryl suture.  An  ostomy appliance was then cut to fit and applied.   He was then taken out of lithotomy, awakened from anesthesia, extubated, and transferred to a stretcher for transport to PACU in satisfactory condition having tolerated the procedure well.

## 2022-01-12 NOTE — Anesthesia Postprocedure Evaluation (Signed)
Anesthesia Post Note  Patient: Jerry Allison  Procedure(s) Performed: XI ROBOTIC ASSISTED LOWER ANTERIOR RESECTION DIVERTING LOOP ILEOSTOMY FLEXIBLE SIGMOIDOSCOPY     Patient location during evaluation: PACU Anesthesia Type: General Level of consciousness: awake and alert Pain management: pain level controlled Vital Signs Assessment: post-procedure vital signs reviewed and stable Respiratory status: spontaneous breathing, nonlabored ventilation and respiratory function stable Cardiovascular status: blood pressure returned to baseline Postop Assessment: no apparent nausea or vomiting Anesthetic complications: no   No notable events documented.  Last Vitals:  Vitals:   01/12/22 1254 01/12/22 1349  BP: (!) 142/82 (!) 139/94  Pulse: 63 (!) 58  Resp: 14 14  Temp: 36.6 C 36.5 C  SpO2: 97% 98%    Last Pain:  Vitals:   01/12/22 1349  TempSrc: Oral  PainSc:                  Marthenia Rolling

## 2022-01-12 NOTE — Anesthesia Procedure Notes (Addendum)
Procedure Name: Intubation Date/Time: 01/12/2022 7:45 AM  Performed by: West Pugh, CRNAPre-anesthesia Checklist: Patient identified, Emergency Drugs available, Suction available, Patient being monitored and Timeout performed Patient Re-evaluated:Patient Re-evaluated prior to induction Oxygen Delivery Method: Circle system utilized Preoxygenation: Pre-oxygenation with 100% oxygen Induction Type: IV induction Ventilation: Mask ventilation without difficulty Laryngoscope Size: Mac and 4 Grade View: Grade II Tube type: Oral Tube size: 7.5 mm Number of attempts: 1 Airway Equipment and Method: Stylet Placement Confirmation: ETT inserted through vocal cords under direct vision, positive ETCO2, CO2 detector and breath sounds checked- equal and bilateral Secured at: 22 cm Tube secured with: Tape Dental Injury: Teeth and Oropharynx as per pre-operative assessment

## 2022-01-12 NOTE — Transfer of Care (Signed)
Immediate Anesthesia Transfer of Care Note  Patient: Jerry Allison  Procedure(s) Performed: XI ROBOTIC ASSISTED LOWER ANTERIOR RESECTION DIVERTING LOOP ILEOSTOMY FLEXIBLE SIGMOIDOSCOPY  Patient Location: PACU  Anesthesia Type:General  Level of Consciousness: drowsy and patient cooperative  Airway & Oxygen Therapy: Patient Spontanous Breathing and Patient connected to face mask oxygen  Post-op Assessment: Report given to RN and Post -op Vital signs reviewed and stable  Post vital signs: Reviewed and stable  Last Vitals:  Vitals Value Taken Time  BP 142/81 01/12/22 1123  Temp    Pulse 64 01/12/22 1125  Resp 11 01/12/22 1125  SpO2 100 % 01/12/22 1125  Vitals shown include unvalidated device data.  Last Pain:  Vitals:   01/12/22 0552  TempSrc:   PainSc: 0-No pain         Complications: No notable events documented.

## 2022-01-13 ENCOUNTER — Encounter (HOSPITAL_COMMUNITY): Payer: Self-pay | Admitting: Surgery

## 2022-01-13 LAB — BASIC METABOLIC PANEL
Anion gap: 5 (ref 5–15)
BUN: 11 mg/dL (ref 6–20)
CO2: 27 mmol/L (ref 22–32)
Calcium: 8.6 mg/dL — ABNORMAL LOW (ref 8.9–10.3)
Chloride: 105 mmol/L (ref 98–111)
Creatinine, Ser: 1.18 mg/dL (ref 0.61–1.24)
GFR, Estimated: >60 mL/min (ref >60)
Glucose, Bld: 119 mg/dL — ABNORMAL HIGH (ref 70–99)
Potassium: 4 mmol/L (ref 3.5–5.1)
Sodium: 137 mmol/L (ref 135–145)

## 2022-01-13 LAB — CBC
HCT: 43 % (ref 39.0–52.0)
Hemoglobin: 14.4 g/dL (ref 13.0–17.0)
MCH: 32.3 pg (ref 26.0–34.0)
MCHC: 33.5 g/dL (ref 30.0–36.0)
MCV: 96.4 fL (ref 80.0–100.0)
Platelets: 255 10*3/uL (ref 150–400)
RBC: 4.46 MIL/uL (ref 4.22–5.81)
RDW: 13.2 % (ref 11.5–15.5)
WBC: 11.5 10*3/uL — ABNORMAL HIGH (ref 4.0–10.5)
nRBC: 0 % (ref 0.0–0.2)

## 2022-01-13 NOTE — Progress Notes (Signed)
1 Day Post-Op   Subjective/Chief Complaint: No complaints other than some soreness   Objective: Vital signs in last 24 hours: Temp:  [97.5 F (36.4 C)-98.7 F (37.1 C)] 98.6 F (37 C) (08/19 0454) Pulse Rate:  [52-73] 66 (08/19 0913) Resp:  [9-18] 16 (08/19 0913) BP: (126-152)/(78-100) 138/100 (08/19 0913) SpO2:  [96 %-100 %] 98 % (08/19 0913) Last BM Date : 01/11/22  Intake/Output from previous day: 08/18 0701 - 08/19 0700 In: 2867.4 [P.O.:120; I.V.:2647.4; IV Piggyback:100] Out: 1721 [Urine:1671; Blood:50] Intake/Output this shift: No intake/output data recorded.  General appearance: alert and cooperative Resp: clear to auscultation bilaterally Cardio: regular rate and rhythm GI: soft, mild tenderness. Incisions ok. Ostomy pink and productive  Lab Results:  Recent Labs    01/13/22 0452  WBC 11.5*  HGB 14.4  HCT 43.0  PLT 255   BMET Recent Labs    01/13/22 0452  NA 137  K 4.0  CL 105  CO2 27  GLUCOSE 119*  BUN 11  CREATININE 1.18  CALCIUM 8.6*   PT/INR No results for input(s): "LABPROT", "INR" in the last 72 hours. ABG No results for input(s): "PHART", "HCO3" in the last 72 hours.  Invalid input(s): "PCO2", "PO2"  Studies/Results: No results found.  Anti-infectives: Anti-infectives (From admission, onward)    Start     Dose/Rate Route Frequency Ordered Stop   01/12/22 1400  neomycin (MYCIFRADIN) tablet 1,000 mg  Status:  Discontinued       See Hyperspace for full Linked Orders Report.   1,000 mg Oral 3 times per day 01/12/22 0531 01/12/22 0535   01/12/22 1400  metroNIDAZOLE (FLAGYL) tablet 1,000 mg  Status:  Discontinued       See Hyperspace for full Linked Orders Report.   1,000 mg Oral 3 times per day 01/12/22 0531 01/12/22 0535   01/12/22 0600  cefoTEtan (CEFOTAN) 2 g in sodium chloride 0.9 % 100 mL IVPB        2 g 200 mL/hr over 30 Minutes Intravenous On call to O.R. 01/12/22 0531 01/12/22 0817       Assessment/Plan: s/p  Procedure(s): XI ROBOTIC ASSISTED LOWER ANTERIOR RESECTION (N/A) DIVERTING LOOP ILEOSTOMY (N/A) FLEXIBLE SIGMOIDOSCOPY (N/A) Advance diet Ambulate Ostomy teaching Monday POD 1  LOS: 1 day    Autumn Messing III 01/13/2022

## 2022-01-14 LAB — CBC
HCT: 44.8 % (ref 39.0–52.0)
Hemoglobin: 15 g/dL (ref 13.0–17.0)
MCH: 32.7 pg (ref 26.0–34.0)
MCHC: 33.5 g/dL (ref 30.0–36.0)
MCV: 97.6 fL (ref 80.0–100.0)
Platelets: 243 10*3/uL (ref 150–400)
RBC: 4.59 MIL/uL (ref 4.22–5.81)
RDW: 13.3 % (ref 11.5–15.5)
WBC: 9.1 10*3/uL (ref 4.0–10.5)
nRBC: 0 % (ref 0.0–0.2)

## 2022-01-14 LAB — BASIC METABOLIC PANEL
Anion gap: 4 — ABNORMAL LOW (ref 5–15)
BUN: 9 mg/dL (ref 6–20)
CO2: 27 mmol/L (ref 22–32)
Calcium: 8.6 mg/dL — ABNORMAL LOW (ref 8.9–10.3)
Chloride: 105 mmol/L (ref 98–111)
Creatinine, Ser: 1.17 mg/dL (ref 0.61–1.24)
GFR, Estimated: >60 mL/min (ref >60)
Glucose, Bld: 95 mg/dL (ref 70–99)
Potassium: 3.7 mmol/L (ref 3.5–5.1)
Sodium: 136 mmol/L (ref 135–145)

## 2022-01-14 NOTE — Progress Notes (Signed)
2 Days Post-Op   Subjective/Chief Complaint: No complaints. Tolerating fulls   Objective: Vital signs in last 24 hours: Temp:  [97.9 F (36.6 C)-98.6 F (37 C)] 98.2 F (36.8 C) (08/20 0405) Pulse Rate:  [63-80] 69 (08/20 0405) Resp:  [16-18] 18 (08/20 0405) BP: (138-150)/(92-105) 150/95 (08/20 0405) SpO2:  [96 %-99 %] 97 % (08/20 0405) Weight:  [76 kg] 76 kg (08/19 2330) Last BM Date : 01/13/22 (ileostomy)  Intake/Output from previous day: 08/19 0701 - 08/20 0700 In: 2110.5 [P.O.:920; I.V.:1190.5] Out: 2725 [Urine:2125; Stool:600] Intake/Output this shift: No intake/output data recorded.  General appearance: alert and cooperative Resp: clear to auscultation bilaterally Cardio: regular rate and rhythm GI: soft, minimal tenderness. Ostomy pink and productive  Lab Results:  Recent Labs    01/13/22 0452 01/14/22 0423  WBC 11.5* 9.1  HGB 14.4 15.0  HCT 43.0 44.8  PLT 255 243   BMET Recent Labs    01/13/22 0452 01/14/22 0423  NA 137 136  K 4.0 3.7  CL 105 105  CO2 27 27  GLUCOSE 119* 95  BUN 11 9  CREATININE 1.18 1.17  CALCIUM 8.6* 8.6*   PT/INR No results for input(s): "LABPROT", "INR" in the last 72 hours. ABG No results for input(s): "PHART", "HCO3" in the last 72 hours.  Invalid input(s): "PCO2", "PO2"  Studies/Results: No results found.  Anti-infectives: Anti-infectives (From admission, onward)    Start     Dose/Rate Route Frequency Ordered Stop   01/12/22 1400  neomycin (MYCIFRADIN) tablet 1,000 mg  Status:  Discontinued       See Hyperspace for full Linked Orders Report.   1,000 mg Oral 3 times per day 01/12/22 0531 01/12/22 0535   01/12/22 1400  metroNIDAZOLE (FLAGYL) tablet 1,000 mg  Status:  Discontinued       See Hyperspace for full Linked Orders Report.   1,000 mg Oral 3 times per day 01/12/22 0531 01/12/22 0535   01/12/22 0600  cefoTEtan (CEFOTAN) 2 g in sodium chloride 0.9 % 100 mL IVPB        2 g 200 mL/hr over 30 Minutes  Intravenous On call to O.R. 01/12/22 0531 01/12/22 0817       Assessment/Plan: s/p Procedure(s): XI ROBOTIC ASSISTED LOWER ANTERIOR RESECTION (N/A) DIVERTING LOOP ILEOSTOMY (N/A) FLEXIBLE SIGMOIDOSCOPY (N/A) Advance diet Ambulate POD 2 Waiting for ostomy teaching tomorrow then home  LOS: 2 days    Autumn Messing III 01/14/2022

## 2022-01-15 ENCOUNTER — Encounter: Payer: Self-pay | Admitting: Hematology

## 2022-01-15 ENCOUNTER — Other Ambulatory Visit (HOSPITAL_COMMUNITY): Payer: Self-pay

## 2022-01-15 LAB — BASIC METABOLIC PANEL
Anion gap: 3 — ABNORMAL LOW (ref 5–15)
BUN: 11 mg/dL (ref 6–20)
CO2: 27 mmol/L (ref 22–32)
Calcium: 9.2 mg/dL (ref 8.9–10.3)
Chloride: 108 mmol/L (ref 98–111)
Creatinine, Ser: 1.19 mg/dL (ref 0.61–1.24)
GFR, Estimated: >60 mL/min (ref >60)
Glucose, Bld: 100 mg/dL — ABNORMAL HIGH (ref 70–99)
Potassium: 4.1 mmol/L (ref 3.5–5.1)
Sodium: 138 mmol/L (ref 135–145)

## 2022-01-15 LAB — CBC
HCT: 45.3 % (ref 39.0–52.0)
Hemoglobin: 15.2 g/dL (ref 13.0–17.0)
MCH: 32.1 pg (ref 26.0–34.0)
MCHC: 33.6 g/dL (ref 30.0–36.0)
MCV: 95.8 fL (ref 80.0–100.0)
Platelets: 249 10*3/uL (ref 150–400)
RBC: 4.73 MIL/uL (ref 4.22–5.81)
RDW: 13 % (ref 11.5–15.5)
WBC: 5.7 10*3/uL (ref 4.0–10.5)
nRBC: 0 % (ref 0.0–0.2)

## 2022-01-15 LAB — SURGICAL PATHOLOGY

## 2022-01-15 MED ORDER — PSYLLIUM 95 % PO PACK
1.0000 | PACK | Freq: Two times a day (BID) | ORAL | Status: DC
  Administered 2022-01-15: 1 via ORAL
  Filled 2022-01-15: qty 1

## 2022-01-15 MED ORDER — TRAMADOL HCL 50 MG PO TABS
50.0000 mg | ORAL_TABLET | Freq: Four times a day (QID) | ORAL | 0 refills | Status: DC | PRN
  Filled 2022-01-15: qty 20, 5d supply, fill #0

## 2022-01-15 NOTE — Plan of Care (Signed)

## 2022-01-15 NOTE — Progress Notes (Signed)
  Subjective No acute events. Doing great. No complaints, nor nausea. Ambulating. Pain well controlled  Objective: Vital signs in last 24 hours: Temp:  [97.8 F (36.6 C)-98.8 F (37.1 C)] 98.8 F (37.1 C) (08/20 2219) Pulse Rate:  [72-74] 74 (08/20 2219) Resp:  [16-18] 18 (08/20 2219) BP: (137-138)/(80-102) 137/102 (08/20 2219) SpO2:  [97 %-98 %] 97 % (08/20 2219) Last BM Date : 01/14/22  Intake/Output from previous day: 08/20 0701 - 08/21 0700 In: 2918.9 [P.O.:1140; I.V.:1778.9] Out: 2825 [Urine:2000; Stool:825] Intake/Output this shift: No intake/output data recorded.  Gen: NAD, comfortable CV: RRR Pulm: Normal work of breathing Abd: Soft, NT/ND. Ileostomy pink, productive Ext: SCDs in place  Lab Results: CBC  Recent Labs    01/14/22 0423 01/15/22 0433  WBC 9.1 5.7  HGB 15.0 15.2  HCT 44.8 45.3  PLT 243 249   BMET Recent Labs    01/14/22 0423 01/15/22 0433  NA 136 138  K 3.7 4.1  CL 105 108  CO2 27 27  GLUCOSE 95 100*  BUN 9 11  CREATININE 1.17 1.19  CALCIUM 8.6* 9.2   PT/INR No results for input(s): "LABPROT", "INR" in the last 72 hours. ABG No results for input(s): "PHART", "HCO3" in the last 72 hours.  Invalid input(s): "PCO2", "PO2"  Studies/Results:  Anti-infectives: Anti-infectives (From admission, onward)    Start     Dose/Rate Route Frequency Ordered Stop   01/12/22 1400  neomycin (MYCIFRADIN) tablet 1,000 mg  Status:  Discontinued       See Hyperspace for full Linked Orders Report.   1,000 mg Oral 3 times per day 01/12/22 0531 01/12/22 0535   01/12/22 1400  metroNIDAZOLE (FLAGYL) tablet 1,000 mg  Status:  Discontinued       See Hyperspace for full Linked Orders Report.   1,000 mg Oral 3 times per day 01/12/22 0531 01/12/22 0535   01/12/22 0600  cefoTEtan (CEFOTAN) 2 g in sodium chloride 0.9 % 100 mL IVPB        2 g 200 mL/hr over 30 Minutes Intravenous On call to O.R. 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

## 2022-01-15 NOTE — Consult Note (Signed)
[place] Nurse ostomy consult note Stoma type/location:  Pt had ileostomy surgery performed Fri.  Wife at bedside for pouch change and teaching session. She is familiar with pouching activities, since she had an ostomy many years ago.  Stomal assessment/size: Stoma is red and viable, 1 1/2 inches, slightly above skin level.  Peristomal assessment: intact skin surrounding Output: 50cc liquid brown stool Ostomy pouching: 1pc. Education provided:  Demonstrated pouch change using barrier ring and one piece convex pouch.  Use barrier ring, [place] # [id number] and Roselee Culver # [phone] Pt was able to shape barrier ring, apply to pouch, and apply pouch over stoma using a hand held mirror.  He could open and close to empty without assistance.  Discussed pouching routines and ordering supplies.  Discussed dietary precautions and importance of avoiding dehydration.  5 sets of supplies provided to patient for use after discharge, long with educational materials.  Enrolled patient in [place] program: Yes Thank-you,  Julien Girt MSN, [place], [place], [place], [place]

## 2022-01-15 NOTE — Discharge Instructions (Signed)
POST OP INSTRUCTIONS AFTER COLON SURGERY  DIET: Be sure to include lots of fluids daily to stay hydrated - 64oz of water per day (8, 8 oz glasses).  Avoid fast food or heavy meals for the first couple of weeks as your are more likely to get nauseated. Avoid raw/uncooked fruits or vegetables for the first 4 weeks (its ok to have these if they are blended into smoothie form). If you have fruits/vegetables, make sure they are cooked until soft enough to mash on the roof of your mouth and chew your food well. Otherwise, diet as tolerated.  Take your usually prescribed home medications unless otherwise directed.  PAIN CONTROL: Pain is best controlled by a usual combination of three different methods TOGETHER: Ice/Heat Over the counter pain medication Prescription pain medication Most patients will experience some swelling and bruising around the surgical site.  Ice packs or heating pads (30-60 minutes up to 6 times a day) will help. Some people prefer to use ice alone, heat alone, alternating between ice & heat.  Experiment to what works for you.  Swelling and bruising can take several weeks to resolve.   It is helpful to take an over-the-counter pain medication regularly for the first few weeks: Ibuprofen (Motrin/Advil) - '200mg'$  tabs - take 3 tabs ('600mg'$ ) every 6 hours as needed for pain (unless you have been directed previously to avoid NSAIDs/ibuprofen) Acetaminophen (Tylenol) - you may take '650mg'$  every 6 hours as needed. You can take this with motrin as they act differently on the body. If you are taking a narcotic pain medication that has acetaminophen in it, do not take over the counter tylenol at the same time. NOTE: You may take both of these medications together - most patients  find it most helpful when alternating between the two (i.e. Ibuprofen at 6am, tylenol at 9am, ibuprofen at 12pm ..Marland Kitchen) A  prescription for pain medication should be given to you upon discharge.  Take your pain medication as  prescribed if your pain is not adequatly controlled with the over-the-counter pain reliefs mentioned above.  Avoid getting constipated.  Between the surgery and the pain medications, it is common to experience some constipation.  Increasing fluid intake and taking a fiber supplement (such as Metamucil, Citrucel, FiberCon, MiraLax, etc) 1-2 times a day regularly will usually help prevent this problem from occurring.  A mild laxative (prune juice, Milk of Magnesia, MiraLax, etc) should be taken according to package directions if there are no bowel movements after 48 hours.    Dressing: Your incisions are covered in Dermabond which is like sterile superglue for the skin. This will come off on it's own in a couple weeks. It is waterproof and you may bathe normally starting the day after your surgery in a shower. Avoid baths/pools/lakes/oceans until your wounds have fully healed.  Ileostomy care: Empty and record your ileostomy output and keep a log of the volume.  This should total no more than 1200 mL (1.2 L) in 24 hours.  Output should be the consistency of toothpaste.  Taking Metamucil 1-2 times daily will help thicken your output and decrease the volume.  If the output is in excess of 1.2 L in 24 hours, please let us know.  It would be important to continue to stay hydrated by drinking plenty of water.  Monitoring your urine output to ensure that you are voiding at least 4-5 times daily and that it is relatively clear.  If the ileostomy output is noted to be  in excess of 1.2 L in 24 hours, generally we will begin by starting Imodium over-the-counter 2 to 3 tablets/day spaced every 8 hours.  ACTIVITIES as tolerated:   Avoid heavy lifting (>10lbs or 1 gallon of milk) for the next 6 weeks. You may resume regular daily activities as tolerated--such as daily self-care, walking, climbing stairs--gradually increasing activities as tolerated.  If you can walk 30 minutes without difficulty, it is safe to try more  intense activity such as jogging, treadmill, bicycling, low-impact aerobics.  DO NOT PUSH THROUGH PAIN.  Let pain be your guide: If it hurts to do something, don't do it. You may drive when you are no longer taking prescription pain medication, you can comfortably wear a seatbelt, and you can safely maneuver your car and apply brakes.  FOLLOW UP in our office Please call [facility] at [phone] to set up an appointment to see your surgeon in the office for a follow-up appointment approximately 2 weeks after your surgery. Make sure that you call for this appointment the day you arrive home to insure a convenient appointment time.  9. If you have disability or family leave forms that need to be completed, you may have them completed by your primary care physician's office; for return to work instructions, please ask our office staff and they will be happy to assist you in obtaining this documentation   When to call us [phone]: Poor pain control Reactions / problems with new medications (rash/itching, etc)  Fever over 101.5 F (38.5 C) Inability to urinate Nausea/vomiting Worsening swelling or bruising Continued bleeding from incision. Increased pain, redness, or drainage from the incision  The clinic staff is available to answer your questions during regular business hours (8:30am-5pm).  Please don't hesitate to call and ask to speak to one of our nurses for clinical concerns.   A surgeon from [facility] is always on call at the hospitals   If you have a medical emergency, go to the nearest emergency room or call 911.  [facility], [place] [address] [place], [place], [place]  [address] MAIN: [phone] FAX: [phone] [email or web address]

## 2022-01-15 NOTE — Progress Notes (Signed)
  Transition of Care [facility]) Screening Note   Patient Details  Name: Jerry Allison Date of Birth: [birth year]   Transition of Care [facility]) CM/SW Contact:    Lennart Pall, LCSW Phone Number: 01/15/2022, 10:29 AM    Transition of Care Department [facility]) has reviewed patient and no TOC needs have been identified at this time. We will continue to monitor patient advancement through interdisciplinary progression rounds. If new patient transition needs arise, please place a TOC consult.

## 2022-01-15 NOTE — Progress Notes (Signed)
Assessment unchanged. Pt and wife verbalized understanding of dc instructions including meds, follow up care and when to call the doctor, as well as ostomy care. Discharged to front entrance via wc accompanied by wife and Nurse.

## 2022-01-16 NOTE — Discharge Summary (Signed)
Patient ID: Jerry Allison MRN: [id number] DOB/AGE: [birth year] 46 y.o.  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  His diet was gradually advanced.  His ileostomy output was controlled without requiring any Imodium supplementation.  He underwent teaching with our wound ostomy care team.  On 01/15/2022, he was comfortable managing his ostomy, output controlled, pain controlled, tolerating a diet.  He is comfortable with and stable for discharge home.    Allergies as of 01/15/2022       Reactions   Shellfish Allergy Anaphylaxis        Medication List     TAKE these medications    escitalopram 10 MG tablet Commonly known as: LEXAPRO Take 5 mg by mouth at bedtime.   gabapentin 100 MG capsule Commonly known as: Neurontin Take 1 capsule (100 mg total) by mouth at bedtime. Ok to increase t0 3 capsules at night if needed and tolerates well What changed:  when to take this reasons to take this additional instructions   omeprazole 20 MG capsule Commonly known as: PRILOSEC Take 1 capsule (20 mg total) by mouth daily.   traMADol 50 MG tablet Commonly known as: Ultram Take 1 tablet by mouth every 6 hours as needed for up to 5 days (postop pain not controlled with tylenol and  ibuprofen first).          Follow-up Information     Ileana Roup, MD Follow up in 2 week(s).   Specialties: General Surgery, Colon and Rectal Surgery Contact information: [place] [address] [place] Dema Severin, M.D. [facility]

## 2022-01-19 ENCOUNTER — Observation Stay (HOSPITAL_COMMUNITY)
Admission: EM | Admit: 2022-01-19 | Discharge: 2022-01-21 | Disposition: A | Discharge status: Home / Self Care | Payer: BC Managed Care – PPO | Attending: General Surgery | Admitting: General Surgery

## 2022-01-19 ENCOUNTER — Other Ambulatory Visit: Payer: Self-pay

## 2022-01-19 ENCOUNTER — Encounter (HOSPITAL_COMMUNITY): Payer: Self-pay

## 2022-01-19 DIAGNOSIS — Z85048 Personal history of other malignant neoplasm of rectum, rectosigmoid junction, and anus: Secondary | ICD-10-CM | POA: Diagnosis not present

## 2022-01-19 DIAGNOSIS — Z932 Ileostomy status: Secondary | ICD-10-CM | POA: Insufficient documentation

## 2022-01-19 DIAGNOSIS — Y833 Surgical operation with formation of external stoma as the cause of abnormal reaction of the patient, or of later complication, without mention of misadventure at the time of the procedure: Secondary | ICD-10-CM | POA: Insufficient documentation

## 2022-01-19 DIAGNOSIS — R112 Nausea with vomiting, unspecified: Secondary | ICD-10-CM | POA: Diagnosis present

## 2022-01-19 DIAGNOSIS — F419 Anxiety disorder, unspecified: Secondary | ICD-10-CM | POA: Diagnosis not present

## 2022-01-19 DIAGNOSIS — K9189 Other postprocedural complications and disorders of digestive system: Secondary | ICD-10-CM | POA: Diagnosis not present

## 2022-01-19 DIAGNOSIS — K567 Ileus, unspecified: Secondary | ICD-10-CM | POA: Diagnosis present

## 2022-01-19 DIAGNOSIS — Z79899 Other long term (current) drug therapy: Secondary | ICD-10-CM | POA: Diagnosis not present

## 2022-01-19 LAB — CBC WITH DIFFERENTIAL/PLATELET
Abs Immature Granulocytes: 0.05 10*3/uL (ref 0.00–0.07)
Basophils Absolute: 0 10*3/uL (ref 0.0–0.1)
Basophils Relative: 0 %
Eosinophils Absolute: 0 10*3/uL (ref 0.0–0.5)
Eosinophils Relative: 0 %
HCT: 53.6 % — ABNORMAL HIGH (ref 39.0–52.0)
Hemoglobin: 18.4 g/dL — ABNORMAL HIGH (ref 13.0–17.0)
Immature Granulocytes: 0 %
Lymphocytes Relative: 9 %
Lymphs Abs: 1 10*3/uL (ref 0.7–4.0)
MCH: 31.8 pg (ref 26.0–34.0)
MCHC: 34.3 g/dL (ref 30.0–36.0)
MCV: 92.6 fL (ref 80.0–100.0)
Monocytes Absolute: 1 10*3/uL (ref 0.1–1.0)
Monocytes Relative: 9 %
Neutro Abs: 9.7 10*3/uL — ABNORMAL HIGH (ref 1.7–7.7)
Neutrophils Relative %: 82 %
Platelets: 351 10*3/uL (ref 150–400)
RBC: 5.79 MIL/uL (ref 4.22–5.81)
RDW: 13.3 % (ref 11.5–15.5)
WBC: 11.9 10*3/uL — ABNORMAL HIGH (ref 4.0–10.5)
nRBC: 0 % (ref 0.0–0.2)

## 2022-01-19 LAB — URINALYSIS, ROUTINE W REFLEX MICROSCOPIC
Bilirubin Urine: NEGATIVE
Glucose, UA: NEGATIVE mg/dL
Ketones, ur: 5 mg/dL — AB
Leukocytes,Ua: NEGATIVE
Nitrite: NEGATIVE
Protein, ur: NEGATIVE mg/dL
Specific Gravity, Urine: 1.024 (ref 1.005–1.030)
pH: 6 (ref 5.0–8.0)

## 2022-01-19 MED ORDER — HYDROMORPHONE HCL 2 MG/ML IJ SOLN
1.0000 mg | Freq: Once | INTRAMUSCULAR | Status: AC
  Administered 2022-01-19: 1 mg via INTRAVENOUS
  Filled 2022-01-19: qty 1

## 2022-01-19 MED ORDER — LACTATED RINGERS IV BOLUS
1000.0000 mL | Freq: Once | INTRAVENOUS | Status: AC
  Administered 2022-01-20: 1000 mL via INTRAVENOUS

## 2022-01-19 MED ORDER — ONDANSETRON HCL 4 MG/2ML IJ SOLN
4.0000 mg | Freq: Once | INTRAMUSCULAR | Status: AC
  Administered 2022-01-19: 4 mg via INTRAVENOUS
  Filled 2022-01-19: qty 2

## 2022-01-19 MED ORDER — METOCLOPRAMIDE HCL 5 MG/ML IJ SOLN
10.0000 mg | Freq: Once | INTRAMUSCULAR | Status: AC
  Administered 2022-01-19: 10 mg via INTRAVENOUS
  Filled 2022-01-19: qty 2

## 2022-01-19 NOTE — ED Triage Notes (Signed)
Reports recent bowel resection and having a colostomy placed.   Has not had any output into bag in almost 2 days.   Sent in by PCP for eval of SBO. Nausea and 2 episodes of vomiting.

## 2022-01-19 NOTE — ED Provider Triage Note (Signed)
Emergency Medicine Provider Triage Evaluation Note  Jerry Allison , a 46 y.o. male  was evaluated in triage.  Pt complains of post op problem. States he had a laparoscopic sigmoidectomy with colostomy placement for colon cancer on Friday that was without complication. States that he has not had any output from his colostomy site for the past 2 days and is having generalized pain, nausea, and vomiting. Called his surgeon who sent him here for evaluation.   Review of Systems  Positive:  Negative:   Physical Exam  BP (!) 146/116 (BP Location: Right Arm)   Pulse (!) 126   Temp 99 F (37.2 C) (Oral)   Resp 18   Ht 6' (1.829 m)   Wt 81.6 kg   SpO2 95%   BMI 24.41 kg/m  Gen:   Awake, no distress   Resp:  Normal effort  MSK:   Moves extremities without difficulty  Other:  Abdominal tenderness, minimal liquid in colostomy bag  Medical Decision Making  Medically screening exam initiated at 10:43 PM.  Appropriate orders placed.  Jerry Allison was informed that the remainder of the evaluation will be completed by another provider, this initial triage assessment does not replace that evaluation, and the importance of remaining in the ED until their evaluation is complete.     Bud Face, PA-C 01/19/22 2245

## 2022-01-20 ENCOUNTER — Emergency Department (HOSPITAL_COMMUNITY): Payer: BC Managed Care – PPO

## 2022-01-20 ENCOUNTER — Encounter (HOSPITAL_COMMUNITY): Payer: Self-pay

## 2022-01-20 DIAGNOSIS — K567 Ileus, unspecified: Secondary | ICD-10-CM | POA: Diagnosis present

## 2022-01-20 LAB — COMPREHENSIVE METABOLIC PANEL
ALT: 67 U/L — ABNORMAL HIGH (ref 0–44)
AST: 33 U/L (ref 15–41)
Albumin: 4.1 g/dL (ref 3.5–5.0)
Alkaline Phosphatase: 102 U/L (ref 38–126)
Anion gap: 11 (ref 5–15)
BUN: 20 mg/dL (ref 6–20)
CO2: 21 mmol/L — ABNORMAL LOW (ref 22–32)
Calcium: 9.8 mg/dL (ref 8.9–10.3)
Chloride: 104 mmol/L (ref 98–111)
Creatinine, Ser: 1.12 mg/dL (ref 0.61–1.24)
GFR, Estimated: >60 mL/min (ref >60)
Glucose, Bld: 141 mg/dL — ABNORMAL HIGH (ref 70–99)
Potassium: 4.6 mmol/L (ref 3.5–5.1)
Sodium: 136 mmol/L (ref 135–145)
Total Bilirubin: 0.8 mg/dL (ref 0.3–1.2)
Total Protein: 8 g/dL (ref 6.5–8.1)

## 2022-01-20 LAB — LIPASE, BLOOD: Lipase: 31 U/L (ref 11–51)

## 2022-01-20 MED ORDER — PROCHLORPERAZINE EDISYLATE 10 MG/2ML IJ SOLN: 5.0000 mg | INTRAMUSCULAR | Status: DC | PRN

## 2022-01-20 MED ORDER — LACTATED RINGERS IV SOLN: INTRAVENOUS | Status: DC

## 2022-01-20 MED ORDER — OXYCODONE HCL 5 MG PO TABS
5.0000 mg | ORAL_TABLET | ORAL | Status: DC | PRN
  Administered 2022-01-21: 10 mg via ORAL
  Filled 2022-01-20: qty 2

## 2022-01-20 MED ORDER — POLYETHYLENE GLYCOL 3350 17 G PO PACK
17.0000 g | PACK | Freq: Every day | ORAL | Status: DC
  Administered 2022-01-20: 17 g via ORAL
  Filled 2022-01-20: qty 1

## 2022-01-20 MED ORDER — ESCITALOPRAM OXALATE 10 MG PO TABS
5.0000 mg | ORAL_TABLET | Freq: Every day | ORAL | Status: DC
  Administered 2022-01-20: 5 mg via ORAL
  Filled 2022-01-20: qty 1

## 2022-01-20 MED ORDER — ACETAMINOPHEN 650 MG RE SUPP: 650.0000 mg | Freq: Four times a day (QID) | RECTAL | Status: DC | PRN

## 2022-01-20 MED ORDER — SIMETHICONE 80 MG PO CHEW: 40.0000 mg | CHEWABLE_TABLET | Freq: Four times a day (QID) | ORAL | Status: DC | PRN

## 2022-01-20 MED ORDER — LACTATED RINGERS IV BOLUS
1000.0000 mL | Freq: Once | INTRAVENOUS | Status: AC
  Administered 2022-01-20: 1000 mL via INTRAVENOUS

## 2022-01-20 MED ORDER — MENTHOL 3 MG MT LOZG: 1.0000 | LOZENGE | OROMUCOSAL | Status: DC | PRN

## 2022-01-20 MED ORDER — LACTATED RINGERS IV BOLUS: 1000.0000 mL | Freq: Three times a day (TID) | INTRAVENOUS | Status: DC | PRN

## 2022-01-20 MED ORDER — DIPHENHYDRAMINE HCL 50 MG/ML IJ SOLN: 12.5000 mg | Freq: Four times a day (QID) | INTRAMUSCULAR | Status: DC | PRN

## 2022-01-20 MED ORDER — MAGIC MOUTHWASH: 15.0000 mL | Freq: Four times a day (QID) | ORAL | Status: DC | PRN

## 2022-01-20 MED ORDER — PHENOL 1.4 % MT LIQD: 2.0000 | OROMUCOSAL | Status: DC | PRN

## 2022-01-20 MED ORDER — IOHEXOL 300 MG/ML  SOLN
100.0000 mL | Freq: Once | INTRAMUSCULAR | Status: AC | PRN
  Administered 2022-01-20: 100 mL via INTRAVENOUS

## 2022-01-20 MED ORDER — SODIUM CHLORIDE 0.9 % IV SOLN: INTRAVENOUS | Status: DC

## 2022-01-20 MED ORDER — CALCIUM POLYCARBOPHIL 625 MG PO TABS
625.0000 mg | ORAL_TABLET | Freq: Two times a day (BID) | ORAL | Status: DC
  Administered 2022-01-20 – 2022-01-21 (×3): 625 mg via ORAL
  Filled 2022-01-20 (×3): qty 1

## 2022-01-20 MED ORDER — METOCLOPRAMIDE HCL 5 MG/ML IJ SOLN: 5.0000 mg | Freq: Three times a day (TID) | INTRAMUSCULAR | Status: DC | PRN

## 2022-01-20 MED ORDER — METHOCARBAMOL 1000 MG/10ML IJ SOLN
500.0000 mg | Freq: Three times a day (TID) | INTRAVENOUS | Status: DC | PRN
  Filled 2022-01-20: qty 5

## 2022-01-20 MED ORDER — LIP MEDEX EX OINT
TOPICAL_OINTMENT | Freq: Two times a day (BID) | CUTANEOUS | Status: DC
  Administered 2022-01-20: 75 via TOPICAL
  Filled 2022-01-20: qty 7

## 2022-01-20 MED ORDER — ONDANSETRON HCL 4 MG/2ML IJ SOLN
4.0000 mg | Freq: Four times a day (QID) | INTRAMUSCULAR | Status: DC | PRN
  Administered 2022-01-20 – 2022-01-21 (×2): 4 mg via INTRAVENOUS

## 2022-01-20 MED ORDER — METHOCARBAMOL 500 MG PO TABS
500.0000 mg | ORAL_TABLET | Freq: Three times a day (TID) | ORAL | Status: DC | PRN
  Administered 2022-01-21: 500 mg via ORAL
  Filled 2022-01-20: qty 1

## 2022-01-20 MED ORDER — ENOXAPARIN SODIUM 40 MG/0.4ML IJ SOSY
40.0000 mg | PREFILLED_SYRINGE | Freq: Every day | INTRAMUSCULAR | Status: DC
  Administered 2022-01-20 – 2022-01-21 (×2): 40 mg via SUBCUTANEOUS
  Filled 2022-01-20 (×2): qty 0.4

## 2022-01-20 MED ORDER — ACETAMINOPHEN 325 MG PO TABS: 650.0000 mg | ORAL_TABLET | Freq: Four times a day (QID) | ORAL | Status: DC | PRN

## 2022-01-20 MED ORDER — ALUM & MAG HYDROXIDE-SIMETH 200-200-20 MG/5ML PO SUSP: 30.0000 mL | Freq: Four times a day (QID) | ORAL | Status: DC | PRN

## 2022-01-20 MED ORDER — ONDANSETRON HCL 4 MG/2ML IJ SOLN
4.0000 mg | Freq: Four times a day (QID) | INTRAMUSCULAR | Status: DC | PRN
  Filled 2022-01-20 (×2): qty 2

## 2022-01-20 MED ORDER — ONDANSETRON 4 MG PO TBDP: 4.0000 mg | ORAL_TABLET | Freq: Four times a day (QID) | ORAL | Status: DC | PRN

## 2022-01-20 MED ORDER — HYDROMORPHONE HCL 2 MG/ML IJ SOLN
1.0000 mg | INTRAMUSCULAR | Status: DC | PRN
  Administered 2022-01-20 – 2022-01-21 (×5): 1 mg via INTRAVENOUS
  Filled 2022-01-20 (×6): qty 1

## 2022-01-20 NOTE — ED Provider Notes (Signed)
[facility] DEPT Provider Note   CSN: [id number] Arrival date & time: 01/19/22  2218     History  Chief Complaint  Patient presents with   Post-op Problem    Jerry Allison is a 46 y.o. male.  Patient presents to the emergency department for evaluation of nausea, vomiting.  Patient recently had colostomy secondary to adenocarcinoma of the rectum.  Patient reports that he has not had any output from his colostomy for the last 2 days.  Tonight started having pain, nausea and vomiting.       Home Medications Prior to Admission medications   Medication Sig Start Date End Date Taking? Authorizing Provider  escitalopram (LEXAPRO) 10 MG tablet Take 5 mg by mouth at bedtime. 11/14/20  Yes [provider]  traMADol (ULTRAM) 50 MG tablet Take 1 tablet by mouth every 6 hours as needed for up to 5 days (postop pain not controlled with tylenol and ibuprofen first). 01/15/22 01/20/22 Yes Ileana Roup, MD  gabapentin (NEURONTIN) 100 MG capsule Take 1 capsule (100 mg total) by mouth at bedtime. Ok to increase t0 3 capsules at night if needed and tolerates well Patient not taking: Reported on 01/19/2022 12/14/21   [name], MD  omeprazole (PRILOSEC) 20 MG capsule Take 1 capsule (20 mg total) by mouth daily. Patient not taking: Reported on 01/02/2022 09/21/21   [name], MD      Allergies    Shellfish allergy    Review of Systems   Review of Systems  Physical Exam Updated Vital Signs BP (!) 135/98   Pulse 78   Temp 99 F (37.2 C) (Oral)   Resp 18   Ht 6' (1.829 m)   Wt 81.6 kg   SpO2 96%   BMI 24.41 kg/m  Physical Exam Vitals and nursing note reviewed.  Constitutional:      General: He is not in acute distress.    Appearance: He is well-developed.  HENT:     Head: Normocephalic and atraumatic.     Mouth/Throat:     Mouth: Mucous membranes are moist.  Eyes:     General: Vision grossly intact. Gaze aligned appropriately.      Extraocular Movements: Extraocular movements intact.     Conjunctiva/sclera: Conjunctivae normal.  Cardiovascular:     Rate and Rhythm: Normal rate and regular rhythm.     Pulses: Normal pulses.     Heart sounds: Normal heart sounds, S1 normal and S2 normal. No murmur heard.    No friction rub. No gallop.  Pulmonary:     Effort: Pulmonary effort is normal. No respiratory distress.     Breath sounds: Normal breath sounds.  Abdominal:     Palpations: Abdomen is soft.     Tenderness: There is generalized abdominal tenderness. There is no guarding or rebound.     Hernia: No hernia is present.     Comments: Bowel sounds are present, slightly high-pitched  Musculoskeletal:        General: No swelling.     Cervical back: Full passive range of motion without pain, normal range of motion and neck supple. No pain with movement, spinous process tenderness or muscular tenderness. Normal range of motion.     Right lower leg: No edema.     Left lower leg: No edema.  Skin:    General: Skin is warm and dry.     Capillary Refill: Capillary refill takes less than 2 seconds.     Findings: No ecchymosis,  erythema, lesion or wound.  Neurological:     Mental Status: He is alert and oriented to person, place, and time.     GCS: GCS eye subscore is 4. GCS verbal subscore is 5. GCS motor subscore is 6.     Cranial Nerves: Cranial nerves 2-12 are intact.     Sensory: Sensation is intact.     Motor: Motor function is intact. No weakness or abnormal muscle tone.     Coordination: Coordination is intact.  Psychiatric:        Mood and Affect: Mood normal.        Speech: Speech normal.        Behavior: Behavior normal.     ED Results / Procedures / Treatments   Labs (all labs ordered are listed, but only abnormal results are displayed) Labs Reviewed  COMPREHENSIVE METABOLIC PANEL - Abnormal; Notable for the following components:      Result Value   CO2 21 (*)    Glucose, Bld 141 (*)    ALT 67 (*)     All other components within normal limits  CBC WITH DIFFERENTIAL/PLATELET - Abnormal; Notable for the following components:   WBC 11.9 (*)    Hemoglobin 18.4 (*)    HCT 53.6 (*)    Neutro Abs 9.7 (*)    All other components within normal limits  URINALYSIS, ROUTINE W REFLEX MICROSCOPIC - Abnormal; Notable for the following components:   Hgb urine dipstick SMALL (*)    Ketones, ur 5 (*)    Bacteria, UA RARE (*)    All other components within normal limits  LIPASE, BLOOD    EKG None  Radiology CT ABDOMEN PELVIS W CONTRAST  Result Date: 01/20/2022 CLINICAL DATA:  Postoperative abdominal pain. Rectal adenocarcinoma. EXAM: CT ABDOMEN AND PELVIS WITH CONTRAST TECHNIQUE: Multidetector CT imaging of the abdomen and pelvis was performed using the standard protocol following bolus administration of intravenous contrast. RADIATION DOSE REDUCTION: This exam was performed according to the departmental dose-optimization program which includes automated exposure control, adjustment of the mA and/or kV according to patient size and/or use of iterative reconstruction technique. CONTRAST:  151m OMNIPAQUE IOHEXOL 300 MG/ML  SOLN COMPARISON:  11/14/2021 FINDINGS: Lower Chest: Normal. Hepatobiliary: Normal hepatic contours. No intra- or extrahepatic biliary dilatation. The gallbladder is normal. Pancreas: Normal pancreas. No ductal dilatation or peripancreatic fluid collection. Spleen: Normal. Adrenals/Urinary Tract: The adrenal glands are normal. No hydronephrosis, nephroureterolithiasis or solid renal mass. The urinary bladder is normal for degree of distention Stomach/Bowel: Anastomotic line at the proximal rectum. There is a right lower quadrant diverting ileostomy. Mildly dilated small bowel with transition point at the ileostomy site. Vascular/Lymphatic: Normal course and caliber of the major abdominal vessels. No abdominal or pelvic lymphadenopathy. Reproductive: Normal prostate size with symmetric seminal  vesicles. Other: There is small volume free intraperitoneal air, which is postoperative. Musculoskeletal: No bony spinal canal stenosis or focal osseous abnormality. IMPRESSION: 1. Status post right lower quadrant diverting ileostomy with mildly dilated small bowel with transition point at the ileostomy site, compatible with obstruction. 2. Small volume free intraperitoneal air, which is postoperative. Electronically Signed   By: KUlyses JarredM.D.   On: 01/20/2022 01:09    Procedures Procedures    Medications Ordered in ED Medications  HYDROmorphone (DILAUDID) injection 1 mg (has no administration in time range)  ondansetron (ZOFRAN) injection 4 mg (has no administration in time range)  lactated ringers infusion (has no administration in time range)  lactated ringers bolus  1,000 mL (0 mLs Intravenous Stopped 01/20/22 0229)  HYDROmorphone (DILAUDID) injection 1 mg (1 mg Intravenous Given 01/19/22 2356)  ondansetron (ZOFRAN) injection 4 mg (4 mg Intravenous Given 01/19/22 2353)  metoCLOPramide (REGLAN) injection 10 mg (10 mg Intravenous Given 01/19/22 2354)  iohexol (OMNIPAQUE) 300 MG/ML solution 100 mL (100 mLs Intravenous Contrast Given 01/20/22 0036)    ED Course/ Medical Decision Making/ A&P                           Medical Decision Making Risk Prescription drug management. Decision regarding hospitalization.   Presents for evaluation of decreased output into his colostomy with nausea and vomiting.  Patient with minimal distention, mild generalized tenderness.  CT scan shows diffuse dilation of bowel with transition point at ostomy, could be obstruction.  Examination more consistent with ileus.  An NG tube was attempted, unsuccessful.  Will admit to general surgery, continue IV fluids, analgesia, antiemetics.        Final Clinical Impression(s) / ED Diagnoses Final diagnoses:  Ileus, postoperative [facility])    Rx / DC Orders ED Discharge Orders     None         Lysha Schrade,  Gwenyth Allegra, MD 01/20/22 [phone]

## 2022-01-20 NOTE — H&P (Signed)
Jerry Allison is an 46 y.o. male.   Chief Complaint: n/v HPI: 72 yom pod 8 from lar/ileostomy for rectal cancer. Was doing well and discharged.  Then had decreased output from ileostomy associated with bloating. We talked last night and I recommended er if he didn't tolerate clear and had n/v.  He did develop n/v and came to er. No fever. Labs show wbc of 11.9 otherwise ok.  Ct shows what I think is ileus and radiology reads as sbo possibly at stoma site.  I was asked to see him  Past Medical History:  Diagnosis Date   Anxiety    Depression    Family history of bladder cancer    Rectal cancer ([place]) 02/2021    Past Surgical History:  Procedure Laterality Date   COLONOSCOPY     EUS N/A 04/27/2021   Procedure: LOWER ENDOSCOPIC ULTRASOUND (EUS);  Surgeon: [name], MD;  Location: [facility];  Service: Endoscopy;  Laterality: N/A;   FLEXIBLE SIGMOIDOSCOPY N/A 04/27/2021   Procedure: FLEXIBLE SIGMOIDOSCOPY;  Surgeon: [name], MD;  Location: [facility];  Service: Endoscopy;  Laterality: N/A;   FLEXIBLE SIGMOIDOSCOPY N/A 01/12/2022   Procedure: FLEXIBLE SIGMOIDOSCOPY;  Surgeon: Ileana Roup, MD;  Location: [facility];  Service: General;  Laterality: N/A;   ILEO LOOP DIVERSION N/A 01/12/2022   Procedure: DIVERTING LOOP ILEOSTOMY;  Surgeon: Ileana Roup, MD;  Location: [facility];  Service: General;  Laterality: N/A;   NO PAST SURGERIES     XI ROBOTIC ASSISTED LOWER ANTERIOR RESECTION N/A 01/12/2022   Procedure: XI ROBOTIC ASSISTED LOWER ANTERIOR RESECTION;  Surgeon: Ileana Roup, MD;  Location: [facility];  Service: General;  Laterality: N/A;    Family History  Problem Relation Age of Onset   Anxiety disorder Father    Healthy Brother    Bladder Cancer Maternal Grandmother 76   Healthy Half-Sister        paternal half   Colon cancer Neg Hx    Esophageal cancer Neg Hx    Stomach cancer Neg Hx    Pancreatic cancer Neg Hx    Liver disease Neg Hx     Inflammatory bowel disease Neg Hx    Social History:  reports that he has never smoked. He has never used smokeless tobacco. He reports current alcohol use. He reports current drug use. Drug: Marijuana.  Allergies:  Allergies  Allergen Reactions   Shellfish Allergy Anaphylaxis    (Not in a hospital admission)   Results for orders placed or performed during the hospital encounter of 01/19/22 (from the past 48 hour(s))  Comprehensive metabolic panel     Status: Abnormal   Collection Time: 01/19/22 10:43 PM  Result Value Ref Range   Sodium 136 135 - 145 mmol/L   Potassium 4.6 3.5 - 5.1 mmol/L   Chloride 104 98 - 111 mmol/L   CO2 21 (L) 22 - 32 mmol/L   Glucose, Bld 141 (H) 70 - 99 mg/dL    Comment: Glucose reference range applies only to samples taken after fasting for at least 8 hours.   BUN 20 6 - 20 mg/dL   Creatinine, Ser 1.12 0.61 - 1.24 mg/dL   Calcium 9.8 8.9 - 10.3 mg/dL   Total Protein 8.0 6.5 - 8.1 g/dL   Albumin 4.1 3.5 - 5.0 g/dL   AST 33 15 - 41 U/L   ALT 67 (H) 0 - 44 U/L   Alkaline Phosphatase 102 38 - 126 U/L  Total Bilirubin 0.8 0.3 - 1.2 mg/dL   GFR, Estimated >60 >60 mL/min    Comment: (NOTE) Calculated using the CKD-EPI Creatinine Equation (2021)    Anion gap 11 5 - 15    Comment: Performed at [facility], [place] [address] [place], [place] [address]  Lipase, blood     Status: None   Collection Time: 01/19/22 10:43 PM  Result Value Ref Range   Lipase 31 11 - 51 U/L    Comment: Performed at [facility], [place] [address] [place], [place] [address]  CBC with Diff     Status: Abnormal   Collection Time: 01/19/22 10:43 PM  Result Value Ref Range   WBC 11.9 (H) 4.0 - 10.5 K/uL   RBC 5.79 4.22 - 5.81 MIL/uL   Hemoglobin 18.4 (H) 13.0 - 17.0 g/dL   HCT 53.6 (H) 39.0 - 52.0 %   MCV 92.6 80.0 - 100.0 fL   MCH 31.8 26.0 - 34.0 pg   MCHC 34.3 30.0 - 36.0 g/dL   RDW 13.3 11.5 - 15.5 %   Platelets 351 150 - 400 K/uL    nRBC 0.0 0.0 - 0.2 %   Neutrophils Relative % 82 %   Neutro Abs 9.7 (H) 1.7 - 7.7 K/uL   Lymphocytes Relative 9 %   Lymphs Abs 1.0 0.7 - 4.0 K/uL   Monocytes Relative 9 %   Monocytes Absolute 1.0 0.1 - 1.0 K/uL   Eosinophils Relative 0 %   Eosinophils Absolute 0.0 0.0 - 0.5 K/uL   Basophils Relative 0 %   Basophils Absolute 0.0 0.0 - 0.1 K/uL   Immature Granulocytes 0 %   Abs Immature Granulocytes 0.05 0.00 - 0.07 K/uL    Comment: Performed at [facility], [place] [address], [place], [place] [address]  Urinalysis, Routine w reflex microscopic     Status: Abnormal   Collection Time: 01/19/22 10:43 PM  Result Value Ref Range   Color, Urine YELLOW YELLOW   APPearance CLEAR CLEAR   Specific Gravity, Urine 1.024 1.005 - 1.030   pH 6.0 5.0 - 8.0   Glucose, UA NEGATIVE NEGATIVE mg/dL   Hgb urine dipstick SMALL (A) NEGATIVE   Bilirubin Urine NEGATIVE NEGATIVE   Ketones, ur 5 (A) NEGATIVE mg/dL   Protein, ur NEGATIVE NEGATIVE mg/dL   Nitrite NEGATIVE NEGATIVE   Leukocytes,Ua NEGATIVE NEGATIVE   RBC / HPF 0-5 0 - 5 RBC/hpf   WBC, UA 0-5 0 - 5 WBC/hpf   Bacteria, UA RARE (A) NONE SEEN   Squamous Epithelial / LPF 0-5 0 - 5   Mucus PRESENT    Hyaline Casts, UA PRESENT     Comment: Performed at [facility], [place] [address], [place], [place] [address]   CT ABDOMEN PELVIS W CONTRAST  Result Date: 01/20/2022 CLINICAL DATA:  Postoperative abdominal pain. Rectal adenocarcinoma. EXAM: CT ABDOMEN AND PELVIS WITH CONTRAST TECHNIQUE: Multidetector CT imaging of the abdomen and pelvis was performed using the standard protocol following bolus administration of intravenous contrast. RADIATION DOSE REDUCTION: This exam was performed according to the departmental dose-optimization program which includes automated exposure control, adjustment of the mA and/or kV according to patient size and/or use of iterative reconstruction technique. CONTRAST:  114m OMNIPAQUE IOHEXOL  300 MG/ML  SOLN COMPARISON:  11/14/2021 FINDINGS: Lower Chest: Normal. Hepatobiliary: Normal hepatic contours. No intra- or extrahepatic biliary dilatation. The gallbladder is normal. Pancreas: Normal pancreas. No ductal dilatation or peripancreatic fluid collection. Spleen:  Normal. Adrenals/Urinary Tract: The adrenal glands are normal. No hydronephrosis, nephroureterolithiasis or solid renal mass. The urinary bladder is normal for degree of distention Stomach/Bowel: Anastomotic line at the proximal rectum. There is a right lower quadrant diverting ileostomy. Mildly dilated small bowel with transition point at the ileostomy site. Vascular/Lymphatic: Normal course and caliber of the major abdominal vessels. No abdominal or pelvic lymphadenopathy. Reproductive: Normal prostate size with symmetric seminal vesicles. Other: There is small volume free intraperitoneal air, which is postoperative. Musculoskeletal: No bony spinal canal stenosis or focal osseous abnormality. IMPRESSION: 1. Status post right lower quadrant diverting ileostomy with mildly dilated small bowel with transition point at the ileostomy site, compatible with obstruction. 2. Small volume free intraperitoneal air, which is postoperative. Electronically Signed   By: Ulyses Jarred M.D.   On: 01/20/2022 01:09    Review of Systems  Constitutional:  Negative for fever.  Gastrointestinal:  Positive for abdominal distention, constipation, nausea and vomiting.  All other systems reviewed and are negative.   Blood pressure (!) 149/97, pulse 78, temperature 98 F (36.7 C), temperature source Oral, resp. rate 18, height 6' (1.829 m), weight 81.6 kg, SpO2 96 %. Physical Exam Constitutional:      Appearance: Normal appearance.  Cardiovascular:     Rate and Rhythm: Normal rate.  Pulmonary:     Effort: Pulmonary effort is normal.  Abdominal:     Palpations: Abdomen is soft.     Tenderness: There is no abdominal tenderness.     Comments: Incisoins  healing, stoma pink with some eschar, I digitized the proximal limb and this is somewhat edematous but finger passes through fascia, there was no rush of fluid with passage of digit  Skin:    General: Skin is warm and dry.  Neurological:     Mental Status: He is alert.  Psychiatric:        Mood and Affect: Mood normal.        Behavior: Behavior normal.      Assessment/Plan POD 8 LAR with diverting ileostomy- CW -likely just has ileus, radiology read states at ileostomy site but I dont agree with that and stoma has been digitized and this is not the case. No other issue on ct scan. -admission, he has some liquid and has had some minor amount air now so will try clears, hydrate today, hopefully home soon -lovenox, scds  Rolm Bookbinder, MD 01/20/2022, 7:34 AM

## 2022-01-21 LAB — BASIC METABOLIC PANEL
Anion gap: 7 (ref 5–15)
BUN: 16 mg/dL (ref 6–20)
CO2: 27 mmol/L (ref 22–32)
Calcium: 8.7 mg/dL — ABNORMAL LOW (ref 8.9–10.3)
Chloride: 105 mmol/L (ref 98–111)
Creatinine, Ser: 1.19 mg/dL (ref 0.61–1.24)
GFR, Estimated: >60 mL/min (ref >60)
Glucose, Bld: 112 mg/dL — ABNORMAL HIGH (ref 70–99)
Potassium: 4.1 mmol/L (ref 3.5–5.1)
Sodium: 139 mmol/L (ref 135–145)

## 2022-01-21 LAB — CBC
HCT: 45.9 % (ref 39.0–52.0)
Hemoglobin: 15.4 g/dL (ref 13.0–17.0)
MCH: 32 pg (ref 26.0–34.0)
MCHC: 33.6 g/dL (ref 30.0–36.0)
MCV: 95.2 fL (ref 80.0–100.0)
Platelets: 303 10*3/uL (ref 150–400)
RBC: 4.82 MIL/uL (ref 4.22–5.81)
RDW: 13.2 % (ref 11.5–15.5)
WBC: 8.3 10*3/uL (ref 4.0–10.5)
nRBC: 0 % (ref 0.0–0.2)

## 2022-01-21 LAB — MAGNESIUM: Magnesium: 2.3 mg/dL (ref 1.7–2.4)

## 2022-01-21 MED ORDER — TRAMADOL HCL 50 MG PO TABS: 50.0000 mg | ORAL_TABLET | Freq: Four times a day (QID) | ORAL | 0 refills | Status: AC | PRN

## 2022-01-21 NOTE — Plan of Care (Signed)
  Problem: Education: Goal: Understanding of discharge needs will improve Outcome: Adequate for Discharge Goal: Verbalization of understanding of the causes of altered bowel function will improve Outcome: Adequate for Discharge

## 2022-01-21 NOTE — Progress Notes (Signed)
Dr Donne Hazel aware of pt's concern of scrotal swelling and redness of private area since foley taken out on previous admission. No new orders. MD advised me to let pt know it's going to take some time to resolve since just recently had sx. Allayed pt's concern and told pt if it didn't resolve to call MD office.

## 2022-01-21 NOTE — Discharge Summary (Signed)
Physician Discharge Summary  Patient ID: Duc Crocket MRN: [id number] DOB/AGE: July 29, 1975 46 y.o.  Admit date: 01/19/2022 Discharge date: 01/21/2022  Admission Diagnoses: S/p lar with ileostomy for rectal cancer Anxiety  Discharge Diagnoses:  Principal Problem:   Ileus, postoperative [facility])   Discharged Condition: good  Hospital Course: 45yom admitted after lar/ileostomy for decreased ileostomy output and ab pain. Ct showed mostly ileus with possible obstruction at ileostomy. This was not case as digital exam passed easily.  His ileus resolved and he tolerated diet to be discharged home  Consults: None  Significant Diagnostic Studies: none  Treatments: IV hydration  Discharge Exam: Blood pressure (!) 149/92, pulse 88, temperature 98.4 F (36.9 C), temperature source Oral, resp. rate 17, height 6' (1.829 m), weight 81.6 kg, SpO2 97 %. Ab soft nontender nondistended ileostomy functional  Disposition: Discharge disposition: 01-Home or Self Care        Allergies as of 01/21/2022       Reactions   Shellfish Allergy Anaphylaxis        Medication List     TAKE these medications    escitalopram 10 MG tablet Commonly known as: LEXAPRO Take 5 mg by mouth at bedtime.   gabapentin 100 MG capsule Commonly known as: Neurontin Take 1 capsule (100 mg total) by mouth at bedtime. Ok to increase t0 3 capsules at night if needed and tolerates well   omeprazole 20 MG capsule Commonly known as: PRILOSEC Take 1 capsule (20 mg total) by mouth daily.   traMADol 50 MG tablet Commonly known as: Ultram Take 1 tablet by mouth every 6 hours as needed for up to 5 days (postop pain not controlled with tylenol and ibuprofen first).        Follow-up Information     Ileana Roup, MD Follow up.   Specialties: General Surgery, Colon and Rectal Surgery Why: as planned Contact information: [place] [id number] [phone]                  Signed: Rolm Bookbinder 01/21/2022, 6:05 PM

## 2022-01-21 NOTE — Progress Notes (Signed)
   Subjective/Chief Complaint: Having bowel function now just filled bag, feels much better, tol clears   Objective: Vital signs in last 24 hours: Temp:  [97.6 F (36.4 C)-98.5 F (36.9 C)] 98 F (36.7 C) (08/27 0853) Pulse Rate:  [76-92] 91 (08/27 0853) Resp:  [16-18] 17 (08/27 0853) BP: (128-149)/(87-101) 140/92 (08/27 0853) SpO2:  [95 %-98 %] 97 % (08/27 0853) Last BM Date : 01/17/22  Intake/Output from previous day: 08/26 0701 - 08/27 0700 In: 4892.5 [P.O.:720; I.V.:2172.5; IV Piggyback:2000] Out: 50 [Stool:50] Intake/Output this shift: No intake/output data recorded.  Ab soft nontender nondistended ileostomy with stool in bag and air  Lab Results:  Recent Labs    01/19/22 2243 01/21/22 0442  WBC 11.9* 8.3  HGB 18.4* 15.4  HCT 53.6* 45.9  PLT 351 303   BMET Recent Labs    01/19/22 2243 01/21/22 0442  NA 136 139  K 4.6 4.1  CL 104 105  CO2 21* 27  GLUCOSE 141* 112*  BUN 20 16  CREATININE 1.12 1.19  CALCIUM 9.8 8.7*   PT/INR No results for input(s): "LABPROT", "INR" in the last 72 hours. ABG No results for input(s): "PHART", "HCO3" in the last 72 hours.  Invalid input(s): "PCO2", "PO2"  Studies/Results: CT ABDOMEN PELVIS W CONTRAST  Result Date: 01/20/2022 CLINICAL DATA:  Postoperative abdominal pain. Rectal adenocarcinoma. EXAM: CT ABDOMEN AND PELVIS WITH CONTRAST TECHNIQUE: Multidetector CT imaging of the abdomen and pelvis was performed using the standard protocol following bolus administration of intravenous contrast. RADIATION DOSE REDUCTION: This exam was performed according to the departmental dose-optimization program which includes automated exposure control, adjustment of the mA and/or kV according to patient size and/or use of iterative reconstruction technique. CONTRAST:  147m OMNIPAQUE IOHEXOL 300 MG/ML  SOLN COMPARISON:  11/14/2021 FINDINGS: Lower Chest: Normal. Hepatobiliary: Normal hepatic contours. No intra- or extrahepatic biliary  dilatation. The gallbladder is normal. Pancreas: Normal pancreas. No ductal dilatation or peripancreatic fluid collection. Spleen: Normal. Adrenals/Urinary Tract: The adrenal glands are normal. No hydronephrosis, nephroureterolithiasis or solid renal mass. The urinary bladder is normal for degree of distention Stomach/Bowel: Anastomotic line at the proximal rectum. There is a right lower quadrant diverting ileostomy. Mildly dilated small bowel with transition point at the ileostomy site. Vascular/Lymphatic: Normal course and caliber of the major abdominal vessels. No abdominal or pelvic lymphadenopathy. Reproductive: Normal prostate size with symmetric seminal vesicles. Other: There is small volume free intraperitoneal air, which is postoperative. Musculoskeletal: No bony spinal canal stenosis or focal osseous abnormality. IMPRESSION: 1. Status post right lower quadrant diverting ileostomy with mildly dilated small bowel with transition point at the ileostomy site, compatible with obstruction. 2. Small volume free intraperitoneal air, which is postoperative. Electronically Signed   By: KUlyses JarredM.D.   On: 01/20/2022 01:09    Anti-infectives: Anti-infectives (From admission, onward)    None       Assessment/Plan: POD 9 LAR with diverting ileostomy- CW -resolved this am, will do soft diet and dc home after lunch if doing well -lovenox, scds   MRolm Bookbinder8/27/2023

## 2022-01-21 NOTE — TOC Transition Note (Signed)
Transition of Care [facility]) - CM/SW Discharge Note   Patient Details  Name: Jerry Allison MRN: [id number] Date of Birth: [birth year]  Transition of Care [facility]) CM/SW Contact:  Dessa Phi, RN Phone Number: 01/21/2022, 10:00 AM   Clinical Narrative:d/c today no orders or CM needs.             Patient Goals and CMS Choice        Discharge Placement                       Discharge Plan and Services                                     Social Determinants of Health (SDOH) Interventions     Readmission Risk Interventions    01/15/2022   10:30 AM  Readmission Risk Prevention Plan  Post Dischage Appt Complete  Medication Screening Complete  Transportation Screening Complete

## 2022-01-21 NOTE — Progress Notes (Signed)
Assessment unchanged. Pt and wife verbalized understanding of dc instructions through teach back including meds to resume. Discharged to front entrance.

## 2022-01-22 ENCOUNTER — Ambulatory Visit (HOSPITAL_COMMUNITY)
Admission: RE | Admit: 2022-01-22 | Discharge: 2022-01-22 | Disposition: A | Discharge status: Home / Self Care | Payer: BC Managed Care – PPO | Source: Ambulatory Visit | Attending: Surgery | Admitting: Surgery

## 2022-01-22 DIAGNOSIS — Z932 Ileostomy status: Secondary | ICD-10-CM | POA: Insufficient documentation

## 2022-01-22 DIAGNOSIS — K9419 Other complications of enterostomy: Secondary | ICD-10-CM | POA: Diagnosis not present

## 2022-01-22 DIAGNOSIS — L24B3 Irritant contact dermatitis related to fecal or urinary stoma or fistula: Secondary | ICD-10-CM

## 2022-01-22 DIAGNOSIS — Z432 Encounter for attention to ileostomy: Secondary | ICD-10-CM | POA: Diagnosis not present

## 2022-01-22 NOTE — Progress Notes (Signed)
[facility]   Reason for visit:  RLQ ileostomy HPI:  Rectal cancer with sigmoidectomy and loop ileostomy ROS  Review of Systems Vital signs:  BP (!) 144/96 (BP Location: Right Arm)   Pulse 81   Temp 98 F (36.7 C) (Oral)   Resp 18   SpO2 95%  Exam:  Physical Exam Vitals reviewed.  Constitutional:      Appearance: Normal appearance.  Abdominal:     Palpations: Abdomen is soft.  Skin:    Findings: Rash present.     Comments: Peristomal breakdown  Neurological:     Mental Status: He is alert.  Psychiatric:     Comments: Anxious about stoma and stoma function     Stoma type/location:  RLQ ileostomy Stomal assessment/size:  1 1/2" pink and moist Peristomal assessment:  denuded circumferentially.  Tender to touch. Barrier ring is adhered to this area and difficult to remove.  Stoma is well budded.  We will apply a convex pouch and no barrier ring.  I will protect the skin with stoma powder and skin prep.  Treatment options for stomal/peristomal skin: stoma powder and skin prep, convex pouch and adding an ostomy belt Output: soft brown stool.  Fluctuates between liquid and and soft stool.  Ostomy pouching: 1pc. Convex  with belt. Education provided:  We discussed how to apply powder and skin prep to protect.  We removed the adherent pouch with adhesive remover.  After application, we applied an ostomy belt.  Patient and wife are more comfortable with the belt in place.  Patient had blockage last week.Spontaneous return to function.   Bowel function has resumed.  We discussed warm bath, relaxation, etc if he has that feeling again.   We discuss showering, life with an ostomy. I encourage exercise, advance as tolerated, healthy diet Impression/dx  Contact dermatitis Ileostomy  Discussion  See above Plan  I will set them up with [employer] for supplies.     Visit time: 55 minutes.   Domenic Moras FNP-BC

## 2022-01-23 DIAGNOSIS — L24B3 Irritant contact dermatitis related to fecal or urinary stoma or fistula: Secondary | ICD-10-CM | POA: Insufficient documentation

## 2022-01-23 DIAGNOSIS — Z432 Encounter for attention to ileostomy: Secondary | ICD-10-CM | POA: Insufficient documentation

## 2022-01-23 DIAGNOSIS — K9419 Other complications of enterostomy: Secondary | ICD-10-CM | POA: Insufficient documentation

## 2022-01-23 NOTE — Discharge Instructions (Signed)
We switched to 1 piece flexible convex.  Item # [id number]  Stoma powder Item # [id number] Skin protectant item# [id number] [facility] Item # [phone]

## 2022-01-24 ENCOUNTER — Other Ambulatory Visit: Payer: Self-pay

## 2022-01-24 NOTE — Progress Notes (Signed)
error 

## 2022-01-24 NOTE — Progress Notes (Signed)
The proposed treatment discussed in conference is for discussion purpose only and is not a binding recommendation.  The patients have not been physically examined, or presented with their treatment options.  Therefore, final treatment plans cannot be decided.  

## 2022-01-30 ENCOUNTER — Emergency Department (HOSPITAL_COMMUNITY): Payer: BC Managed Care – PPO

## 2022-01-30 ENCOUNTER — Observation Stay (HOSPITAL_COMMUNITY)
Admission: EM | Admit: 2022-01-30 | Discharge: 2022-01-31 | Disposition: A | Discharge status: Home / Self Care | Payer: BC Managed Care – PPO | Attending: Surgery | Admitting: Surgery

## 2022-01-30 ENCOUNTER — Encounter (HOSPITAL_COMMUNITY): Payer: Self-pay

## 2022-01-30 ENCOUNTER — Other Ambulatory Visit: Payer: Self-pay

## 2022-01-30 DIAGNOSIS — Z85048 Personal history of other malignant neoplasm of rectum, rectosigmoid junction, and anus: Secondary | ICD-10-CM | POA: Insufficient documentation

## 2022-01-30 DIAGNOSIS — K56600 Partial intestinal obstruction, unspecified as to cause: Principal | ICD-10-CM | POA: Insufficient documentation

## 2022-01-30 DIAGNOSIS — D500 Iron deficiency anemia secondary to blood loss (chronic): Secondary | ICD-10-CM | POA: Diagnosis not present

## 2022-01-30 DIAGNOSIS — R1033 Periumbilical pain: Secondary | ICD-10-CM | POA: Diagnosis not present

## 2022-01-30 DIAGNOSIS — Z932 Ileostomy status: Secondary | ICD-10-CM

## 2022-01-30 DIAGNOSIS — K9419 Other complications of enterostomy: Secondary | ICD-10-CM | POA: Diagnosis present

## 2022-01-30 LAB — CBC WITH DIFFERENTIAL/PLATELET
Abs Immature Granulocytes: 0.04 10*3/uL (ref 0.00–0.07)
Basophils Absolute: 0 10*3/uL (ref 0.0–0.1)
Basophils Relative: 0 %
Eosinophils Absolute: 0.1 10*3/uL (ref 0.0–0.5)
Eosinophils Relative: 1 %
HCT: 51.8 % (ref 39.0–52.0)
Hemoglobin: 17.4 g/dL — ABNORMAL HIGH (ref 13.0–17.0)
Immature Granulocytes: 0 %
Lymphocytes Relative: 10 %
Lymphs Abs: 1.1 10*3/uL (ref 0.7–4.0)
MCH: 31.2 pg (ref 26.0–34.0)
MCHC: 33.6 g/dL (ref 30.0–36.0)
MCV: 93 fL (ref 80.0–100.0)
Monocytes Absolute: 1.4 10*3/uL — ABNORMAL HIGH (ref 0.1–1.0)
Monocytes Relative: 13 %
Neutro Abs: 7.8 10*3/uL — ABNORMAL HIGH (ref 1.7–7.7)
Neutrophils Relative %: 76 %
Platelets: 409 10*3/uL — ABNORMAL HIGH (ref 150–400)
RBC: 5.57 MIL/uL (ref 4.22–5.81)
RDW: 12.8 % (ref 11.5–15.5)
WBC: 10.4 10*3/uL (ref 4.0–10.5)
nRBC: 0 % (ref 0.0–0.2)

## 2022-01-30 LAB — COMPREHENSIVE METABOLIC PANEL
ALT: 25 U/L (ref 0–44)
AST: 23 U/L (ref 15–41)
Albumin: 4.3 g/dL (ref 3.5–5.0)
Alkaline Phosphatase: 79 U/L (ref 38–126)
Anion gap: 11 (ref 5–15)
BUN: 16 mg/dL (ref 6–20)
CO2: 21 mmol/L — ABNORMAL LOW (ref 22–32)
Calcium: 9.9 mg/dL (ref 8.9–10.3)
Chloride: 105 mmol/L (ref 98–111)
Creatinine, Ser: 1.16 mg/dL (ref 0.61–1.24)
GFR, Estimated: >60 mL/min (ref >60)
Glucose, Bld: 113 mg/dL — ABNORMAL HIGH (ref 70–99)
Potassium: 4.2 mmol/L (ref 3.5–5.1)
Sodium: 137 mmol/L (ref 135–145)
Total Bilirubin: 0.9 mg/dL (ref 0.3–1.2)
Total Protein: 8.2 g/dL — ABNORMAL HIGH (ref 6.5–8.1)

## 2022-01-30 LAB — URINALYSIS, ROUTINE W REFLEX MICROSCOPIC
Bilirubin Urine: NEGATIVE
Glucose, UA: NEGATIVE mg/dL
Hgb urine dipstick: NEGATIVE
Ketones, ur: NEGATIVE mg/dL
Leukocytes,Ua: NEGATIVE
Nitrite: NEGATIVE
Protein, ur: NEGATIVE mg/dL
Specific Gravity, Urine: 1.01 (ref 1.005–1.030)
pH: 5 (ref 5.0–8.0)

## 2022-01-30 LAB — LIPASE, BLOOD: Lipase: 37 U/L (ref 11–51)

## 2022-01-30 LAB — HIV ANTIBODY (ROUTINE TESTING W REFLEX): HIV Screen 4th Generation wRfx: NONREACTIVE

## 2022-01-30 MED ORDER — ONDANSETRON HCL 4 MG/2ML IJ SOLN
4.0000 mg | Freq: Once | INTRAMUSCULAR | Status: AC
  Administered 2022-01-30: 4 mg via INTRAVENOUS
  Filled 2022-01-30: qty 2

## 2022-01-30 MED ORDER — IOHEXOL 300 MG/ML  SOLN
100.0000 mL | Freq: Once | INTRAMUSCULAR | Status: AC | PRN
  Administered 2022-01-30: 100 mL via INTRAVENOUS

## 2022-01-30 MED ORDER — METOCLOPRAMIDE HCL 5 MG/ML IJ SOLN
5.0000 mg | Freq: Once | INTRAMUSCULAR | Status: AC
  Administered 2022-01-30: 5 mg via INTRAVENOUS
  Filled 2022-01-30: qty 2

## 2022-01-30 MED ORDER — METHOCARBAMOL 1000 MG/10ML IJ SOLN: 500.0000 mg | Freq: Three times a day (TID) | INTRAVENOUS | Status: DC | PRN

## 2022-01-30 MED ORDER — METHOCARBAMOL 500 MG PO TABS
500.0000 mg | ORAL_TABLET | Freq: Three times a day (TID) | ORAL | Status: DC | PRN
  Administered 2022-01-30 – 2022-01-31 (×2): 500 mg via ORAL
  Filled 2022-01-30 (×2): qty 1

## 2022-01-30 MED ORDER — ENOXAPARIN SODIUM 40 MG/0.4ML IJ SOSY
40.0000 mg | PREFILLED_SYRINGE | INTRAMUSCULAR | Status: DC
  Administered 2022-01-30: 40 mg via SUBCUTANEOUS
  Filled 2022-01-30: qty 0.4

## 2022-01-30 MED ORDER — PANTOPRAZOLE SODIUM 40 MG IV SOLR
40.0000 mg | Freq: Every day | INTRAVENOUS | Status: DC
  Administered 2022-01-30: 40 mg via INTRAVENOUS
  Filled 2022-01-30: qty 10

## 2022-01-30 MED ORDER — DIPHENHYDRAMINE HCL 50 MG/ML IJ SOLN
12.5000 mg | Freq: Once | INTRAMUSCULAR | Status: AC
Start: 2022-01-30 — End: 2022-01-30
  Administered 2022-01-30: 12.5 mg via INTRAVENOUS
  Filled 2022-01-30: qty 1

## 2022-01-30 MED ORDER — ESCITALOPRAM OXALATE 10 MG PO TABS
5.0000 mg | ORAL_TABLET | Freq: Every day | ORAL | Status: DC
  Administered 2022-01-30: 5 mg via ORAL
  Filled 2022-01-30: qty 1

## 2022-01-30 MED ORDER — HYDROMORPHONE HCL 2 MG/ML IJ SOLN
0.5000 mg | INTRAMUSCULAR | Status: DC | PRN
  Administered 2022-01-30 – 2022-01-31 (×3): 1 mg via INTRAVENOUS
  Filled 2022-01-30 (×3): qty 1

## 2022-01-30 MED ORDER — HYDROMORPHONE HCL 2 MG/ML IJ SOLN
1.0000 mg | Freq: Once | INTRAMUSCULAR | Status: AC
  Administered 2022-01-30: 1 mg via INTRAVENOUS
  Filled 2022-01-30: qty 1

## 2022-01-30 MED ORDER — DIPHENHYDRAMINE HCL 50 MG/ML IJ SOLN: 25.0000 mg | Freq: Four times a day (QID) | INTRAMUSCULAR | Status: DC | PRN

## 2022-01-30 MED ORDER — ONDANSETRON 4 MG PO TBDP: 4.0000 mg | ORAL_TABLET | Freq: Four times a day (QID) | ORAL | Status: DC | PRN

## 2022-01-30 MED ORDER — PROCHLORPERAZINE EDISYLATE 10 MG/2ML IJ SOLN: 5.0000 mg | Freq: Four times a day (QID) | INTRAMUSCULAR | Status: DC | PRN

## 2022-01-30 MED ORDER — SODIUM CHLORIDE 0.9 % IV BOLUS
1000.0000 mL | Freq: Once | INTRAVENOUS | Status: AC
  Administered 2022-01-30: 1000 mL via INTRAVENOUS

## 2022-01-30 MED ORDER — ACETAMINOPHEN 650 MG RE SUPP: 650.0000 mg | Freq: Four times a day (QID) | RECTAL | Status: DC | PRN

## 2022-01-30 MED ORDER — SODIUM CHLORIDE 0.9 % IV SOLN: INTRAVENOUS | Status: DC

## 2022-01-30 MED ORDER — DIPHENHYDRAMINE HCL 25 MG PO CAPS: 25.0000 mg | ORAL_CAPSULE | Freq: Four times a day (QID) | ORAL | Status: DC | PRN

## 2022-01-30 MED ORDER — ONDANSETRON HCL 4 MG/2ML IJ SOLN
4.0000 mg | Freq: Four times a day (QID) | INTRAMUSCULAR | Status: DC | PRN
  Administered 2022-01-31: 4 mg via INTRAVENOUS
  Filled 2022-01-30: qty 2

## 2022-01-30 MED ORDER — SODIUM CHLORIDE (PF) 0.9 % IJ SOLN
INTRAMUSCULAR | Status: AC
  Filled 2022-01-30: qty 50

## 2022-01-30 MED ORDER — HYDRALAZINE HCL 20 MG/ML IJ SOLN: 10.0000 mg | INTRAMUSCULAR | Status: DC | PRN

## 2022-01-30 MED ORDER — PROCHLORPERAZINE MALEATE 10 MG PO TABS: 10.0000 mg | ORAL_TABLET | Freq: Four times a day (QID) | ORAL | Status: DC | PRN

## 2022-01-30 MED ORDER — ACETAMINOPHEN 325 MG PO TABS
650.0000 mg | ORAL_TABLET | Freq: Four times a day (QID) | ORAL | Status: DC | PRN
  Administered 2022-01-30: 650 mg via ORAL
  Filled 2022-01-30: qty 2

## 2022-01-30 NOTE — ED Notes (Signed)
Pt had one episode of vomiting. Feels better and pain easing off.

## 2022-01-30 NOTE — ED Triage Notes (Signed)
Patient arrived stating he has had no output from his colostomy bag since Sunday. Complaints of abdominal pain.

## 2022-01-30 NOTE — ED Provider Notes (Signed)
[facility] DEPT Provider Note   CSN: [id number] Arrival date & time: 01/30/22  0316     History  Chief Complaint  Patient presents with   Post-op Problem    Jerry Allison is a 46 y.o. male.  Patient as above with significant medical history as below, including rectal cancer status post sigmoidectomy and loop ileostomy 8/18 Dr Gross/Dr Dema Severin. Was admitted and subsequentially discharged 8/27 under care of Dr. Donne Hazel following ?ileus, patient was able to tolerate diet and began having output from his ileostomy.  Discharged stable condition.  Presents to the ED with complaint of minimal output from ostomy since Sunday 9/3.   Nausea, abd pain, poor PO intake. No vomiting. Has not been eating or drinking because he is worried it will cause vomiting. Reduced UOP, no fevers or chills, no cp or dib, no fever/chills. No medications PTA. No melena or blood output to his ostomy. No sig pain around the ostomy site but having peri-umbilical pain last 48 hours.   s/p robotoic assisted lower anterior resection/diverting loop ileostomy Dr Gross/Dr Dema Severin 8/18   Past Medical History:  Diagnosis Date   Anxiety    Depression    Family history of bladder cancer    Rectal cancer ([place]) 02/2021    Past Surgical History:  Procedure Laterality Date   COLONOSCOPY     EUS N/A 04/27/2021   Procedure: LOWER ENDOSCOPIC ULTRASOUND (EUS);  Surgeon: [name], MD;  Location: [facility];  Service: Endoscopy;  Laterality: N/A;   FLEXIBLE SIGMOIDOSCOPY N/A 04/27/2021   Procedure: FLEXIBLE SIGMOIDOSCOPY;  Surgeon: [name], MD;  Location: [facility];  Service: Endoscopy;  Laterality: N/A;   FLEXIBLE SIGMOIDOSCOPY N/A 01/12/2022   Procedure: FLEXIBLE SIGMOIDOSCOPY;  Surgeon: Ileana Roup, MD;  Location: [facility];  Service: General;  Laterality: N/A;   ILEO LOOP DIVERSION N/A 01/12/2022   Procedure: DIVERTING LOOP ILEOSTOMY;  Surgeon: Ileana Roup,  MD;  Location: [facility];  Service: General;  Laterality: N/A;   NO PAST SURGERIES     XI ROBOTIC ASSISTED LOWER ANTERIOR RESECTION N/A 01/12/2022   Procedure: XI ROBOTIC ASSISTED LOWER ANTERIOR RESECTION;  Surgeon: Ileana Roup, MD;  Location: [facility];  Service: General;  Laterality: N/A;     The history is provided by the patient and the spouse. No language interpreter was used.       Home Medications Prior to Admission medications   Medication Sig Start Date End Date Taking? Authorizing Provider  escitalopram (LEXAPRO) 10 MG tablet Take 5 mg by mouth at bedtime. 11/14/20  Yes [provider]  gabapentin (NEURONTIN) 100 MG capsule Take 1 capsule (100 mg total) by mouth at bedtime. Ok to increase t0 3 capsules at night if needed and tolerates well Patient taking differently: Take 100 mg by mouth daily as needed (for nerve pain in feet). Ok to increase t0 3 capsules at night if needed and tolerates well 12/14/21  Yes [name], MD  omeprazole (PRILOSEC) 20 MG capsule Take 1 capsule (20 mg total) by mouth daily. Patient taking differently: Take 20 mg by mouth daily as needed (for heartburn). 09/21/21  Yes [name], MD      Allergies    Shellfish allergy    Review of Systems   Review of Systems  Constitutional:  Positive for appetite change. Negative for chills and fever.  HENT:  Negative for facial swelling and trouble swallowing.   Eyes:  Negative for photophobia and visual disturbance.  Respiratory:  Negative for cough and shortness of breath.   Cardiovascular:  Negative for chest pain and palpitations.  Gastrointestinal:  Positive for nausea. Negative for abdominal pain and vomiting.  Endocrine: Negative for polydipsia and polyuria.  Genitourinary:  Negative for difficulty urinating and hematuria.  Musculoskeletal:  Negative for gait problem and joint swelling.  Skin:  Negative for pallor and rash.  Neurological:  Negative for syncope and headaches.   Psychiatric/Behavioral:  Negative for agitation and confusion.     Physical Exam Updated Vital Signs BP (!) 137/96   Pulse 73   Temp 98 F (36.7 C) (Oral)   Resp 12   Ht 6' (1.829 m)   Wt 79.4 kg   SpO2 95%   BMI 23.73 kg/m  Physical Exam Vitals and nursing note reviewed.  Constitutional:      General: He is not in acute distress.    Appearance: Normal appearance. He is well-developed. He is not ill-appearing.  HENT:     Head: Normocephalic and atraumatic.     Right Ear: External ear normal.     Left Ear: External ear normal.     Mouth/Throat:     Mouth: Mucous membranes are moist.  Eyes:     General: No scleral icterus. Cardiovascular:     Rate and Rhythm: Regular rhythm. Tachycardia present.     Pulses: Normal pulses.     Heart sounds: Normal heart sounds.  Pulmonary:     Effort: Pulmonary effort is normal. No respiratory distress.     Breath sounds: Normal breath sounds.  Abdominal:     General: Abdomen is flat.     Palpations: Abdomen is soft.     Tenderness: There is abdominal tenderness. There is no guarding or rebound.    Musculoskeletal:        General: Normal range of motion.     Cervical back: Normal range of motion.     Right lower leg: No edema.     Left lower leg: No edema.  Skin:    General: Skin is warm and dry.     Capillary Refill: Capillary refill takes less than 2 seconds.  Neurological:     Mental Status: He is alert and oriented to person, place, and time.     GCS: GCS eye subscore is 4. GCS verbal subscore is 5. GCS motor subscore is 6.  Psychiatric:        Mood and Affect: Mood normal.        Behavior: Behavior normal.     ED Results / Procedures / Treatments   Labs (all labs ordered are listed, but only abnormal results are displayed) Labs Reviewed  CBC WITH DIFFERENTIAL/PLATELET - Abnormal; Notable for the following components:      Result Value   Hemoglobin 17.4 (*)    Platelets 409 (*)    Neutro Abs 7.8 (*)    Monocytes  Absolute 1.4 (*)    All other components within normal limits  COMPREHENSIVE METABOLIC PANEL - Abnormal; Notable for the following components:   CO2 21 (*)    Glucose, Bld 113 (*)    Total Protein 8.2 (*)    All other components within normal limits  LIPASE, BLOOD  URINALYSIS, ROUTINE W REFLEX MICROSCOPIC    EKG EKG Interpretation  Date/Time:  Tuesday January 30 2022 06:29:33 EDT Ventricular Rate:  78 PR Interval:  156 QRS Duration: 99 QT Interval:  377 QTC Calculation: 430 R Axis:   89 Text Interpretation: Sinus rhythm  Confirmed by Wynona Dove ([id number]) on 01/30/2022 7:17:32 AM  Radiology CT ABDOMEN PELVIS W CONTRAST  Result Date: 01/30/2022 CLINICAL DATA:  History of colon resection for cancer. No ostomy output for 3 days EXAM: CT ABDOMEN AND PELVIS WITH CONTRAST TECHNIQUE: Multidetector CT imaging of the abdomen and pelvis was performed using the standard protocol following bolus administration of intravenous contrast. RADIATION DOSE REDUCTION: This exam was performed according to the departmental dose-optimization program which includes automated exposure control, adjustment of the mA and/or kV according to patient size and/or use of iterative reconstruction technique. CONTRAST:  157m OMNIPAQUE IOHEXOL 300 MG/ML  SOLN COMPARISON:  01/20/2022 FINDINGS: Lower chest:  No contributory findings. Hepatobiliary: No focal liver abnormality.No evidence of biliary obstruction or stone. Pancreas: Unremarkable. Spleen: Unremarkable. Adrenals/Urinary Tract: Negative adrenals. No hydronephrosis or stone. Unchanged 3.2 cm cyst in the upper pole left kidney, noted to be septated on 2022 ultrasound. Unremarkable bladder. Stomach/Bowel: Postoperative distal colon. Ileostomy in the right lower quadrant. Progressive fluid distension of small bowel loops with collapse just before the ileostomy. Terminal ileum beyond the ileostomy is collapsed as expected. Some colonic fluid is present. Vascular/Lymphatic: No  acute vascular abnormality. No mass or adenopathy. Reproductive:No pathologic findings. Other: Small volume pelvic ascites. Mesorectal edema which is likely postoperative. Musculoskeletal: No acute abnormalities. IMPRESSION: 1. Progressive fluid dilated small bowel before the right lower quadrant ileostomy, suggesting partial obstruction. 2. Small volume ascites. Electronically Signed   By: JJorje GuildM.D.   On: 01/30/2022 06:27   DG Chest Portable 1 View  Result Date: 01/30/2022 CLINICAL DATA:  No output from colostomy for 3 days EXAM: PORTABLE CHEST 1 VIEW COMPARISON:  Chest CT 11/14/2021 FINDINGS: The heart size and mediastinal contours are within normal limits. Both lungs are clear. The visualized skeletal structures are unremarkable. IMPRESSION: No active disease. Electronically Signed   By: JJorje GuildM.D.   On: 01/30/2022 04:11    Procedures Procedures    Medications Ordered in ED Medications  sodium chloride (PF) 0.9 % injection (has no administration in time range)  sodium chloride 0.9 % bolus 1,000 mL (0 mLs Intravenous Stopped 01/30/22 0709)  HYDROmorphone (DILAUDID) injection 1 mg (1 mg Intravenous Given 01/30/22 0449)  ondansetron (ZOFRAN) injection 4 mg (4 mg Intravenous Given 01/30/22 0449)  iohexol (OMNIPAQUE) 300 MG/ML solution 100 mL (100 mLs Intravenous Contrast Given 01/30/22 0545)  metoCLOPramide (REGLAN) injection 5 mg (5 mg Intravenous Given 01/30/22 0630)  diphenhydrAMINE (BENADRYL) injection 12.5 mg (12.5 mg Intravenous Given 01/30/22 0630)    ED Course/ Medical Decision Making/ A&P                           Medical Decision Making Amount and/or Complexity of Data Reviewed Labs: ordered. Radiology: ordered. ECG/medicine tests: ordered.  Risk Prescription drug management. Decision regarding hospitalization.   This patient presents to the ED with chief complaint(s) of abd pain, minimal ostomy o/p with pertinent past medical history of above which further  complicates the presenting complaint. The complaint involves an extensive differential diagnosis and also carries with it a high risk of complications and morbidity.    Differential diagnosis includes but is not exclusive to acute appendicitis, renal colic, testicular torsion, urinary tract infection, prostatitis,  diverticulitis, small bowel obstruction, colitis, abdominal aortic aneurysm, gastroenteritis, constipation etc.  Differential diagnosis includes but is not exclusive to acute cholecystitis, intrathoracic causes for epigastric abdominal pain, gastritis, duodenitis, pancreatitis, small bowel or large bowel obstruction, abdominal aortic  aneurysm, hernia, gastritis, etc.  Serious etiologies were considered.   The initial plan is to screening labs/imaging/analgesics/ivf/ctap   Additional history obtained: Additional history obtained from spouse Records reviewed previous admission documents and prior ed visits, prior labs/imaging  Independent labs interpretation:  The following labs were independently interpreted:  Hemoglobin 17.4, suggests hemoconcentration; poor PO last 48 hours, will give IVF CMP stable, lipase WNL UA pending at time of shift change.   Independent visualization of imaging: - I independently visualized the following imaging with scope of interpretation limited to determining acute life threatening conditions related to emergency care: CXR, CTAP, which revealed CXR w/o acute process, CTAP with ?partial obstruction, ascites. Pt with nausea/no ostomy output last 48 hours.   Cardiac monitoring was reviewed and interpreted by myself which shows NSR  Treatment and Reassessment: Dilaudid, zofran, ivf >> symptoms improved  Did require repeat dose of anti-emetic, given reglan/benadryl as nausea returned >> improved following reglan/benadryl  Consultation: - Consulted or discussed management/test interpretation w/ external professional: general surgery Dr Ninfa Linden, they  will see in consult.   Consideration for admission or further workup: Admission was considered   Recommend admission for partial SBO, nausea, unable to tolerate PO intake. Will d/w triad Dr Marylyn Ishihara who accepts pt for admission.    Social Determinants of health: Social History   Tobacco Use   Smoking status: Never   Smokeless tobacco: Never  Vaping Use   Vaping Use: Never used  Substance Use Topics   Alcohol use: Yes    Comment: social-weekly   Drug use: Yes    Types: Marijuana    Comment: occ            Final Clinical Impression(s) / ED Diagnoses Final diagnoses:  Periumbilical abdominal pain  Partial intestinal obstruction, unspecified cause [facility])    Rx / DC Orders ED Discharge Orders     None         Jeanell Sparrow, DO 01/30/22 [phone]

## 2022-01-30 NOTE — H&P (Signed)
[facility] Admission Note  Jerry Allison [birth year]  [id number].    Requesting MD: Wynona Dove Chief Complaint/Reason for Consult: SBO  HPI:  Jerry Allison is a 46 y.o. male PMH rectal cancer s/p Robotic assisted low anterior resection with double stapled colorectal anastomosis with Diverting loop ileostomy by Dr. Dema Severin on 01/13/52. He was readmitted 8/26 through 8/27 with ileus versus pSBO after quickly clinically improving. States that he was doing well since discharge until about 2 days ago at which point he developed progressive abdominal pain, bloating, nausea, vomiting, and decreased ileostomy output. Pain is mostly around the ostomy. States that the pain is worse this time than last time.  CT scan shows progressive fluid dilated small bowel before the right lower quadrant ileostomy, suggesting partial obstruction. General surgery asked to see.  Family History  Problem Relation Age of Onset   Anxiety disorder Father    Healthy Brother    Bladder Cancer Maternal Grandmother 61   Healthy Half-Sister        paternal half   Colon cancer Neg Hx    Esophageal cancer Neg Hx    Stomach cancer Neg Hx    Pancreatic cancer Neg Hx    Liver disease Neg Hx    Inflammatory bowel disease Neg Hx     Past Medical History:  Diagnosis Date   Anxiety    Depression    Family history of bladder cancer    Rectal cancer ([place]) 02/2021    Past Surgical History:  Procedure Laterality Date   COLONOSCOPY     EUS N/A 04/27/2021   Procedure: LOWER ENDOSCOPIC ULTRASOUND (EUS);  Surgeon: [name], MD;  Location: [facility];  Service: Endoscopy;  Laterality: N/A;   FLEXIBLE SIGMOIDOSCOPY N/A 04/27/2021   Procedure: FLEXIBLE SIGMOIDOSCOPY;  Surgeon: [name], MD;  Location: [facility];  Service: Endoscopy;  Laterality: N/A;   FLEXIBLE SIGMOIDOSCOPY N/A 01/12/2022   Procedure: FLEXIBLE SIGMOIDOSCOPY;  Surgeon: Ileana Roup, MD;  Location: [facility];  Service:  General;  Laterality: N/A;   ILEO LOOP DIVERSION N/A 01/12/2022   Procedure: DIVERTING LOOP ILEOSTOMY;  Surgeon: Ileana Roup, MD;  Location: [facility];  Service: General;  Laterality: N/A;   NO PAST SURGERIES     XI ROBOTIC ASSISTED LOWER ANTERIOR RESECTION N/A 01/12/2022   Procedure: XI ROBOTIC ASSISTED LOWER ANTERIOR RESECTION;  Surgeon: Ileana Roup, MD;  Location: [facility];  Service: General;  Laterality: N/A;    Social History:  reports that he has never smoked. He has never used smokeless tobacco. He reports current alcohol use. He reports current drug use. Drug: Marijuana.  Allergies:  Allergies  Allergen Reactions   Shellfish Allergy Anaphylaxis    (Not in a hospital admission)   Prior to Admission medications   Medication Sig Start Date End Date Taking? Authorizing Provider  escitalopram (LEXAPRO) 10 MG tablet Take 5 mg by mouth at bedtime. 11/14/20  Yes [provider]  gabapentin (NEURONTIN) 100 MG capsule Take 1 capsule (100 mg total) by mouth at bedtime. Ok to increase t0 3 capsules at night if needed and tolerates well Patient taking differently: Take 100 mg by mouth daily as needed (for nerve pain in feet). Ok to increase t0 3 capsules at night if needed and tolerates well 12/14/21  Yes [name], MD  omeprazole (PRILOSEC) 20 MG capsule Take 1 capsule (20 mg total) by mouth daily. Patient taking differently: Take 20 mg by mouth daily as needed (for heartburn). 09/21/21  Yes [name], MD    Blood pressure (!) 125/90, pulse 71, temperature 97.7 F (36.5 C), temperature source Oral, resp. rate 10, height 6' (1.829 m), weight 79.4 kg, SpO2 95 %. Physical Exam: General: pleasant, WD/WN male who is laying in bed in NAD HEENT: head is normocephalic, atraumatic.  Sclera are noninjected.  Pupils equal and round.  Ears and nose without any masses or lesions.  Mouth is pink and moist. Dentition fair Heart: regular, rate, and rhythm Lungs: CTAB, no wheezes,  rhonchi, or rales noted.  Respiratory effort nonlabored Abd: incisions cdi, soft, mild distension, mild tenderness mostly proximal/medial to the stoma. Stoma viable, on digital exam it feels somewhat edematous but able to pass digit through  Results for orders placed or performed during the hospital encounter of 01/30/22 (from the past 48 hour(s))  CBC with Differential     Status: Abnormal   Collection Time: 01/30/22  4:25 AM  Result Value Ref Range   WBC 10.4 4.0 - 10.5 K/uL   RBC 5.57 4.22 - 5.81 MIL/uL   Hemoglobin 17.4 (H) 13.0 - 17.0 g/dL   HCT 51.8 39.0 - 52.0 %   MCV 93.0 80.0 - 100.0 fL   MCH 31.2 26.0 - 34.0 pg   MCHC 33.6 30.0 - 36.0 g/dL   RDW 12.8 11.5 - 15.5 %   Platelets 409 (H) 150 - 400 K/uL   nRBC 0.0 0.0 - 0.2 %   Neutrophils Relative % 76 %   Neutro Abs 7.8 (H) 1.7 - 7.7 K/uL   Lymphocytes Relative 10 %   Lymphs Abs 1.1 0.7 - 4.0 K/uL   Monocytes Relative 13 %   Monocytes Absolute 1.4 (H) 0.1 - 1.0 K/uL   Eosinophils Relative 1 %   Eosinophils Absolute 0.1 0.0 - 0.5 K/uL   Basophils Relative 0 %   Basophils Absolute 0.0 0.0 - 0.1 K/uL   Immature Granulocytes 0 %   Abs Immature Granulocytes 0.04 0.00 - 0.07 K/uL    Comment: Performed at [facility], [place] [address] [place], [place] [address]  Comprehensive metabolic panel     Status: Abnormal   Collection Time: 01/30/22  4:25 AM  Result Value Ref Range   Sodium 137 135 - 145 mmol/L   Potassium 4.2 3.5 - 5.1 mmol/L   Chloride 105 98 - 111 mmol/L   CO2 21 (L) 22 - 32 mmol/L   Glucose, Bld 113 (H) 70 - 99 mg/dL    Comment: Glucose reference range applies only to samples taken after fasting for at least 8 hours.   BUN 16 6 - 20 mg/dL   Creatinine, Ser 1.16 0.61 - 1.24 mg/dL   Calcium 9.9 8.9 - 10.3 mg/dL   Total Protein 8.2 (H) 6.5 - 8.1 g/dL   Albumin 4.3 3.5 - 5.0 g/dL   AST 23 15 - 41 U/L   ALT 25 0 - 44 U/L   Alkaline Phosphatase 79 38 - 126 U/L   Total Bilirubin 0.9 0.3 - 1.2  mg/dL   GFR, Estimated >60 >60 mL/min    Comment: (NOTE) Calculated using the CKD-EPI Creatinine Equation (2021)    Anion gap 11 5 - 15    Comment: Performed at [facility], [place] [address], [place], [place] [address]  Lipase, blood     Status: None   Collection Time: 01/30/22  4:25 AM  Result Value Ref Range   Lipase 37 11 - 51 U/L    Comment:  Performed at [facility], [place] [address], [place], [place] [address]   CT ABDOMEN PELVIS W CONTRAST  Result Date: 01/30/2022 CLINICAL DATA:  History of colon resection for cancer. No ostomy output for 3 days EXAM: CT ABDOMEN AND PELVIS WITH CONTRAST TECHNIQUE: Multidetector CT imaging of the abdomen and pelvis was performed using the standard protocol following bolus administration of intravenous contrast. RADIATION DOSE REDUCTION: This exam was performed according to the departmental dose-optimization program which includes automated exposure control, adjustment of the mA and/or kV according to patient size and/or use of iterative reconstruction technique. CONTRAST:  149m OMNIPAQUE IOHEXOL 300 MG/ML  SOLN COMPARISON:  01/20/2022 FINDINGS: Lower chest:  No contributory findings. Hepatobiliary: No focal liver abnormality.No evidence of biliary obstruction or stone. Pancreas: Unremarkable. Spleen: Unremarkable. Adrenals/Urinary Tract: Negative adrenals. No hydronephrosis or stone. Unchanged 3.2 cm cyst in the upper pole left kidney, noted to be septated on 2022 ultrasound. Unremarkable bladder. Stomach/Bowel: Postoperative distal colon. Ileostomy in the right lower quadrant. Progressive fluid distension of small bowel loops with collapse just before the ileostomy. Terminal ileum beyond the ileostomy is collapsed as expected. Some colonic fluid is present. Vascular/Lymphatic: No acute vascular abnormality. No mass or adenopathy. Reproductive:No pathologic findings. Other: Small volume pelvic ascites. Mesorectal edema which is  likely postoperative. Musculoskeletal: No acute abnormalities. IMPRESSION: 1. Progressive fluid dilated small bowel before the right lower quadrant ileostomy, suggesting partial obstruction. 2. Small volume ascites. Electronically Signed   By: JJorje GuildM.D.   On: 01/30/2022 06:27   DG Chest Portable 1 View  Result Date: 01/30/2022 CLINICAL DATA:  No output from colostomy for 3 days EXAM: PORTABLE CHEST 1 VIEW COMPARISON:  Chest CT 11/14/2021 FINDINGS: The heart size and mediastinal contours are within normal limits. Both lungs are clear. The visualized skeletal structures are unremarkable. IMPRESSION: No active disease. Electronically Signed   By: JJorje GuildM.D.   On: 01/30/2022 04:11      Assessment/Plan Rectal cancer -POD#18  s/p Robotic assisted low anterior resection with double stapled colorectal anastomosis with Diverting loop ileostomy 8/18 Dr. WDema Severin- 2nd readmission since surgery with SBO vs ileus - CT scan shows progressive fluid dilated small bowel before the right lower quadrant ileostomy, suggesting partial obstruction  - Stoma does feel tight but I am able to digitize - Discussed with Dr. WDema Severin Will admit for bowel rest and hydration. Hold on NG tube given difficulty with this last time and he currently denies emesis.   ID - none VTE - SCDs, lovenox FEN - IVF, NPO Foley - none  Anxiety/depression  I reviewed ED provider notes, last 24 h vitals and pain scores, last 48 h intake and output, last 24 h labs and trends, and last 24 h imaging results   BWellington Hampshire PA-C [facility] 01/30/2022, 8:11 AM Please see Amion for pager number during day hours 7:00am-4:30pm

## 2022-01-30 NOTE — ED Notes (Signed)
2 attempts to get IV unsuccessful. Will use ultrasound

## 2022-01-30 NOTE — ED Notes (Signed)
Urinal at bedside.  

## 2022-01-31 LAB — BASIC METABOLIC PANEL
Anion gap: 5 (ref 5–15)
BUN: 14 mg/dL (ref 6–20)
CO2: 26 mmol/L (ref 22–32)
Calcium: 8.7 mg/dL — ABNORMAL LOW (ref 8.9–10.3)
Chloride: 108 mmol/L (ref 98–111)
Creatinine, Ser: 1.07 mg/dL (ref 0.61–1.24)
GFR, Estimated: >60 mL/min (ref >60)
Glucose, Bld: 97 mg/dL (ref 70–99)
Potassium: 4.7 mmol/L (ref 3.5–5.1)
Sodium: 139 mmol/L (ref 135–145)

## 2022-01-31 LAB — CBC
HCT: 42.7 % (ref 39.0–52.0)
Hemoglobin: 13.9 g/dL (ref 13.0–17.0)
MCH: 31.4 pg (ref 26.0–34.0)
MCHC: 32.6 g/dL (ref 30.0–36.0)
MCV: 96.6 fL (ref 80.0–100.0)
Platelets: 321 10*3/uL (ref 150–400)
RBC: 4.42 MIL/uL (ref 4.22–5.81)
RDW: 12.7 % (ref 11.5–15.5)
WBC: 3.2 10*3/uL — ABNORMAL LOW (ref 4.0–10.5)
nRBC: 0 % (ref 0.0–0.2)

## 2022-01-31 LAB — PHOSPHORUS: Phosphorus: 3.4 mg/dL (ref 2.5–4.6)

## 2022-01-31 LAB — MAGNESIUM: Magnesium: 2 mg/dL (ref 1.7–2.4)

## 2022-01-31 NOTE — Discharge Instructions (Signed)
POST OP INSTRUCTIONS  DIET: As tolerated. Follow a light bland diet the first 24 hours after arrival home, such as soup, liquids, crackers, etc.  Be sure to include lots of fluids daily.  Avoid fast food or heavy meals as your are more likely to get nauseated.  Eat a low fat the next few days after surgery.  Take your usually prescribed home medications unless otherwise directed.  PAIN CONTROL: Pain is best controlled by a usual combination of three different methods TOGETHER: Ice/Heat Over the counter pain medication Prescription pain medication Most patients will experience some swelling and bruising around the surgical site.  Ice packs or heating pads (30-60 minutes up to 6 times a day) will help. Some people prefer to use ice alone, heat alone, alternating between ice & heat.  Experiment to what works for you.  Swelling and bruising can take several weeks to resolve.   It is helpful to take an over-the-counter pain medication regularly for the first few weeks: Ibuprofen (Motrin/Advil) - '200mg'$  tabs - take 3 tabs ('600mg'$ ) every 6 hours as needed for pain Acetaminophen (Tylenol) - you may take '650mg'$  every 6 hours as needed. You can take this with motrin as they act differently on the body. If you are taking a narcotic pain medication that has acetaminophen in it, do not take over the counter tylenol at the same time.  Iii. NOTE: You may take both of these medications together - most patients  find it most helpful when alternating between the two (i.e. Ibuprofen at 6am,  tylenol at 9am, ibuprofen at 12pm ..Marland Kitchen) A  prescription for pain medication should be given to you upon discharge.  Take your pain medication as prescribed if your pain is not adequatly controlled with the over-the-counter pain reliefs mentioned above.  Avoid getting constipated.  Between the surgery and the pain medications, it is common to experience some constipation.  Increasing fluid intake and taking a fiber supplement (such as  Metamucil, Citrucel, FiberCon, MiraLax, etc) 1-2 times a day regularly will usually help prevent this problem from occurring.  A mild laxative (prune juice, Milk of Magnesia, MiraLax, etc) should be taken according to package directions if there are no bowel movements after 48 hours.    Ileostomy: Monitor ileostomy output - goal being <1.2L (1,200 mL) every 24 hrs. Keep a written log. Chew food REALLY well - turning food into pureed form before swallowing.  ACTIVITIES as tolerated:   Avoid heavy lifting (>10lbs or 1 gallon of milk) for the next 6 weeks. You may resume regular (light) daily activities beginning the next day--such as daily self-care, walking, climbing stairs--gradually increasing activities as tolerated.  If you can walk 30 minutes without difficulty, it is safe to try more intense activity such as jogging, treadmill, bicycling, low-impact aerobics.  DO NOT PUSH THROUGH PAIN.  Let pain be your guide: If it hurts to do something, don't do it. You may drive when you are no longer taking prescription pain medication, you can comfortably wear a seatbelt, and you can safely maneuver your car and apply brakes.   FOLLOW UP in our office Please call [facility] at [phone] to set up an appointment to see your surgeon in the office for a follow-up appointment approximately 2 weeks after your surgery. Make sure that you call for this appointment the day you arrive home to insure a convenient appointment time.  9. If you have disability or family leave forms that need to be completed, you may  have them completed by your primary care physician's office; for return to work instructions, please ask our office staff and they will be happy to assist you in obtaining this documentation   When to call us [phone]: Poor pain control Reactions / problems with new medications (rash/itching, etc)  Fever over 101.5 F (38.5 C) Inability to urinate Nausea/vomiting Worsening swelling or  bruising Continued bleeding from incision. Increased pain, redness, or drainage from the incision  The clinic staff is available to answer your questions during regular business hours (8:30am-5pm).  Please don't hesitate to call and ask to speak to one of our nurses for clinical concerns.   A surgeon from [facility] is always on call at the hospitals   If you have a medical emergency, go to the nearest emergency room or call 911.  [facility] [address] [place], [place], [place]  [address] MAIN: [phone] FAX: [phone] [email or web address]

## 2022-01-31 NOTE — TOC Progression Note (Signed)
  Transition of Care [facility]) Screening Note   Patient Details  Name: Jerry Allison Date of Birth: January 18, 1976   Transition of Care [facility]) CM/SW Contact:    Purcell Mouton, RN Phone Number: 01/31/2022, 2:45 PM    Transition of Care Department [facility]) has reviewed patient and no TOC needs have been identified at this time. We will continue to monitor patient advancement through interdisciplinary progression rounds. If new patient transition needs arise, please place a TOC consult.  Transition of Care [facility]) - Progression Note    Patient Details  Name: Jerry Allison MRN: [id number] Date of Birth: January 18, 1976  Transition of Care [facility]) CM/SW Contact  Purcell Mouton, RN Phone Number: 01/31/2022, 2:45 PM  Clinical Narrative:            Expected Discharge Plan and Services           Expected Discharge Date: 01/31/22                                     Social Determinants of Health (SDOH) Interventions    Readmission Risk Interventions    01/15/2022   10:30 AM  Readmission Risk Prevention Plan  Post Dischage Appt Complete  Medication Screening Complete  Transportation Screening Complete

## 2022-01-31 NOTE — Progress Notes (Signed)
  Subjective No acute events. Feeling MUCH better. Ileostomy working well again. No n/v. Distention/abdominal bloating resolved.  Objective: Vital signs in last 24 hours: Temp:  [97.4 F (36.3 C)-98.5 F (36.9 C)] 97.8 F (36.6 C) (09/06 0606) Pulse Rate:  [68-81] 81 (09/06 0606) Resp:  [16-18] 18 (09/06 0606) BP: (127-133)/(83-98) 132/91 (09/06 0606) SpO2:  [95 %-100 %] 99 % (09/06 0606) Weight:  [79.5 kg] 79.5 kg (09/05 0927) Last BM Date :  (Ileostomy RUQ)  Intake/Output from previous day: 09/05 0701 - 09/06 0700 In: 2171.5 [P.O.:240; I.V.:1931.5] Out: 775 [Stool:775] Intake/Output this shift: No intake/output data recorded.  Gen: NAD, comfortable CV: RRR Pulm: Normal work of breathing Abd: Soft, NT/ND. Ileostomy productive Ext: SCDs in place  Lab Results: CBC  Recent Labs    01/30/22 0425 01/31/22 0426  WBC 10.4 3.2*  HGB 17.4* 13.9  HCT 51.8 42.7  PLT 409* 321   BMET Recent Labs    01/30/22 0425 01/31/22 0426  NA 137 139  K 4.2 4.7  CL 105 108  CO2 21* 26  GLUCOSE 113* 97  BUN 16 14  CREATININE 1.16 1.07  CALCIUM 9.9 8.7*   PT/INR No results for input(s): "LABPROT", "INR" in the last 72 hours. ABG No results for input(s): "PHART", "HCO3" in the last 72 hours.  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

## 2022-02-01 ENCOUNTER — Other Ambulatory Visit: Payer: Self-pay

## 2022-02-01 ENCOUNTER — Encounter: Payer: BC Managed Care – PPO | Admitting: Nutrition

## 2022-02-01 ENCOUNTER — Inpatient Hospital Stay: Payer: BC Managed Care – PPO

## 2022-02-01 ENCOUNTER — Encounter: Payer: Self-pay | Admitting: Hematology

## 2022-02-01 ENCOUNTER — Inpatient Hospital Stay: Payer: BC Managed Care – PPO | Attending: Hematology | Admitting: Hematology

## 2022-02-01 ENCOUNTER — Ambulatory Visit: Payer: BC Managed Care – PPO | Admitting: Nutrition

## 2022-02-01 VITALS — BP 134/99 | HR 79 | Temp 98.6°F | Resp 17 | Ht 72.0 in | Wt 179.3 lb

## 2022-02-01 DIAGNOSIS — D630 Anemia in neoplastic disease: Secondary | ICD-10-CM

## 2022-02-01 DIAGNOSIS — Z79899 Other long term (current) drug therapy: Secondary | ICD-10-CM | POA: Insufficient documentation

## 2022-02-01 DIAGNOSIS — C20 Malignant neoplasm of rectum: Secondary | ICD-10-CM

## 2022-02-01 DIAGNOSIS — D500 Iron deficiency anemia secondary to blood loss (chronic): Secondary | ICD-10-CM

## 2022-02-01 DIAGNOSIS — G629 Polyneuropathy, unspecified: Secondary | ICD-10-CM | POA: Diagnosis not present

## 2022-02-01 LAB — CBC WITH DIFFERENTIAL (CANCER CENTER ONLY)
Abs Immature Granulocytes: 0.01 10*3/uL (ref 0.00–0.07)
Basophils Absolute: 0 10*3/uL (ref 0.0–0.1)
Basophils Relative: 0 %
Eosinophils Absolute: 0.1 10*3/uL (ref 0.0–0.5)
Eosinophils Relative: 3 %
HCT: 42.4 % (ref 39.0–52.0)
Hemoglobin: 14.9 g/dL (ref 13.0–17.0)
Immature Granulocytes: 0 %
Lymphocytes Relative: 27 %
Lymphs Abs: 1.1 10*3/uL (ref 0.7–4.0)
MCH: 31.9 pg (ref 26.0–34.0)
MCHC: 35.1 g/dL (ref 30.0–36.0)
MCV: 90.8 fL (ref 80.0–100.0)
Monocytes Absolute: 0.7 10*3/uL (ref 0.1–1.0)
Monocytes Relative: 18 %
Neutro Abs: 2.1 10*3/uL (ref 1.7–7.7)
Neutrophils Relative %: 52 %
Platelet Count: 331 10*3/uL (ref 150–400)
RBC: 4.67 MIL/uL (ref 4.22–5.81)
RDW: 12.5 % (ref 11.5–15.5)
WBC Count: 4.1 10*3/uL (ref 4.0–10.5)
nRBC: 0 % (ref 0.0–0.2)

## 2022-02-01 LAB — CMP (CANCER CENTER ONLY)
ALT: 23 U/L (ref 0–44)
AST: 22 U/L (ref 15–41)
Albumin: 4.1 g/dL (ref 3.5–5.0)
Alkaline Phosphatase: 72 U/L (ref 38–126)
Anion gap: 3 — ABNORMAL LOW (ref 5–15)
BUN: 9 mg/dL (ref 6–20)
CO2: 28 mmol/L (ref 22–32)
Calcium: 9.8 mg/dL (ref 8.9–10.3)
Chloride: 106 mmol/L (ref 98–111)
Creatinine: 1.13 mg/dL (ref 0.61–1.24)
GFR, Estimated: >60 mL/min (ref >60)
Glucose, Bld: 103 mg/dL — ABNORMAL HIGH (ref 70–99)
Potassium: 4 mmol/L (ref 3.5–5.1)
Sodium: 137 mmol/L (ref 135–145)
Total Bilirubin: 0.5 mg/dL (ref 0.3–1.2)
Total Protein: 7.1 g/dL (ref 6.5–8.1)

## 2022-02-01 LAB — FERRITIN: Ferritin: 58 ng/mL (ref 24–336)

## 2022-02-01 LAB — IRON AND IRON BINDING CAPACITY (CC-WL,HP ONLY)
Iron: 61 ug/dL (ref 45–182)
Saturation Ratios: 16 % — ABNORMAL LOW (ref 17.9–39.5)
TIBC: 385 ug/dL (ref 250–450)
UIBC: 324 ug/dL (ref 117–376)

## 2022-02-01 NOTE — Progress Notes (Signed)
[place]   [phone] [phone]   Clinic Follow up Note   Patient Care Team: Derinda Late, MD as PCP - General (Family Medicine) [name], MD as Consulting Physician (Gastroenterology) [name], MD as Consulting Physician (Hematology) Nadeen Landau (Inactive) Kyung Rudd, MD as Consulting Physician (Radiation Oncology) [name], RN as Oncology Nurse Navigator (Oncology) Cheryll Cockayne, MD as Referring Physician (Surgical Oncology)  Date of Service:  02/01/2022  CHIEF COMPLAINT: f/u of rectal cancer  CURRENT THERAPY:  Surveillance  ASSESSMENT & PLAN:  Jerry Allison is a 46 y.o. male with   1. 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 Colonoscopy on 03/07/21 by Dr. [name] showed a partially obstructing tumor in proximal rectum. Biopsy confirmed adenocarcinoma and a tubular adenoma polyp. -baseline CEA that day was normal at 1.7. -staging CT CAP 03/09/21 was negative for metastatic disease. -pelvic MRI 03/27/21 was also negative for nodal metastasis but was not able to give T stage  -he underwent EUS on 04/27/21 with Dr. Ardis Hughs showing: 4 cm rectal adenocarcinoma invading into and through the muscularis propria layer of rectal wall; 1.5 cm satellite nodule located 5-8 mm distal to tumor. This is staged as T3 -he began CAPOX on 05/10/21. He tolerated first cycle poorly with a lot of vomiting and fatigue. He tolerated cycle 2 better with Emend and reduced oxali dose. We held cycle 3 due to worsening transaminitis. Abdomen US on 06/29/21 was negative for metastatic disease, showed only mild steatosis. He completed 6 cycles on 09/21/21. -he received concurrent chemoRT with Xeloda 10/09/21 - 11/15/21. He tolerated relatively well overall, with expected GI side effects from radiation towards the end of treatment. -restaging CT CAP performed 11/14/21 showed  decreased rectal thickening and no metastatic disease.  -s/p resection 12/1821 by Dr. Dema Severin, path showed complete pathological response, with no residual carcinoma. His postoperative course has been complicated by recurrent obstructions. -while he was found to have a good response to treatment, he is very frustrated from all the side effects and complications from treatment and feels he was over-treated. I discussed the NCCN guideline and the evolving change of rectal cancer treatment.  -No adjuvant therapy is needed. I discussed the risk of cancer recurrence in the future. I discussed the surveillance plan, which is a physical exam and lab test (including CBC, CMP and CEA) every 3 months for the first 2 years, then every 6-12 months, colonoscopy in one year, and surveilliance CT scan every 6-12 month for up to 5 year.     2. 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 -we reviewed low fiber diet, and I provided him with a handout with further recommendations. -I sent him to nutrition for coupons for Ensure to try.  3. 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 I encouraged him to increase his gabapentin dose as needed.       PLAN:  -increase gabapentin to 238m, then 3073mat night as needed for neuropathy  -I gave him a print out of low fiber diet, and made referral to nutrition. Dietician BaRaford Pitcherill give him some ensure sample today  -f/u with Dr. WhDema Severinomorrow, 9/8 -lab and f/u in 4 months   No problem-specific Assessment & Plan notes found for this encounter.   SUMMARY OF ONCOLOGIC HISTORY: Oncology History Overview Note   Cancer Staging  Adenocarcinoma  of rectum [facility]) Staging form: Colon and Rectum - Neuroendocine Tumors, AJCC 8th Edition - Clinical stage from 03/29/2021: Stage IIB (cT3, cN0, cM0) - Signed by [name], MD on 06/20/2021    Adenocarcinoma of rectum ([place])  03/07/2021  Procedure   Colonoscopy, Dr. [name]  Impression - Malignant appearing partially obstructing tumor in the proximal rectum. Biopsied. Tattooed. - One 3 mm polyp in the distal rectum, removed with a cold snare. Resected and retrieved. - The examined portion of the ileum was normal. - The distal rectum and anal verge are normal on retroflexion view.   03/07/2021 Pathology Results   Diagnosis 1. Rectum, biopsy, mass - ADENOCARCINOMA - SEE COMMENT 2. Rectum, biopsy, polyp (1) - TUBULAR ADENOMA (1 OF 1 FRAGMENTS) - NO HIGH-GRADE DYSPLASIA OR MALIGNANCY IDENTIFIED   03/09/2021 Imaging   CT CAP  IMPRESSION: 1. Asymmetric wall thickening in the proximal/mid rectum may correspond to mass seen on endoscopy. 2. Negative for adenopathy or evidence of distal metastatic disease.   03/27/2021 Imaging   MRI Pelvis  IMPRESSION: 1. Low sigmoid primary, greater than 15 cm from the anal verge, as detailed above. Poorly delineated secondary to underdistention in this region. 2. No evidence of pelvic nodal metastasis. 3. Trace perisigmoid fluid, new since the prior CT.   03/28/2021 Initial Diagnosis   Adenocarcinoma of rectum ([place])   03/29/2021 Cancer Staging   Staging form: Colon and Rectum - Neuroendocine Tumors, AJCC 8th Edition - Clinical stage from 03/29/2021: Stage IIB (cT3, cN0, cM0) - Signed by [name], MD on 06/20/2021 Stage prefix: Initial diagnosis Histologic grade (G): G2 Histologic grading system: 3 grade system   04/12/2021 Genetic Testing   Negative genetic testing on the CancerNext-Expanded+RNAinsight panel.  The report date is 04/12/2021.  The CancerNext-Expanded gene panel offered by [facility] and includes sequencing and rearrangement analysis for the following 77 genes: AIP, ALK, APC*, ATM*, AXIN2, BAP1, BARD1, BLM, BMPR1A, BRCA1*, BRCA2*, BRIP1*, CDC73, CDH1*, CDK4, CDKN1B, CDKN2A, CHEK2*, CTNNA1, DICER1, FANCC, FH, FLCN, GALNT12, KIF1B, LZTR1, MAX, MEN1, MET,  MLH1*, MSH2*, MSH3, MSH6*, MUTYH*, NBN, NF1*, NF2, NTHL1, PALB2*, PHOX2B, PMS2*, POT1, PRKAR1A, PTCH1, PTEN*, RAD51C*, RAD51D*, RB1, RECQL, RET, SDHA, SDHAF2, SDHB, SDHC, SDHD, SMAD4, SMARCA4, SMARCB1, SMARCE1, STK11, SUFU, TMEM127, TP53*, TSC1, TSC2, VHL and XRCC2 (sequencing and deletion/duplication); EGFR, EGLN1, HOXB13, KIT, MITF, PDGFRA, POLD1, and POLE (sequencing only); EPCAM and GREM1 (deletion/duplication only). DNA and RNA analyses performed for * genes.    04/27/2021 Procedure   EUS, Dr. Ardis Hughs  Impression: - 4cm long, circumferential, at least uT3Nx rectal adenocarcinoma with distal edge located 10cm from the anal verge. 1.5cm satellite nodule located 5-84m distal to the tumor (around 9cm from the anal verge).   05/10/2021 - 10/04/2021 Chemotherapy   Patient is on Treatment Plan : RECTAL Xelox ([place]apeox) q21d x 6 cycles      01/12/2022 Definitive Surgery   FINAL MICROSCOPIC DIAGNOSIS:   A. COLON, RECTOSIGMOID, RESECTION:  - Fibrosis and mild chronic inflammation consistent with treatment  effect.  - No residual carcinoma (ypT0).  - All surgical margins negative for tumor.  - Eighteen lymph nodes negative for metastatic carcinoma (0/18)(ypN0).  - See oncology table.       INTERVAL HISTORY:  JHurbert Duranis here for a follow up of rectal cancer. He was last seen by me on 12/14/21. He presents to the clinic alone. His postoperative course has been very rough on him, as he has been hospitalized for two bowel obstructions. He expressed his frustration with  his situation today. He describes the last year as "nothing but pain and suffering, and for nothing." He feels we over-treated him and caused a lot of his problems.   All other systems were reviewed with the patient and are negative.  MEDICAL HISTORY:  Past Medical History:  Diagnosis Date   Anxiety    Depression    Family history of bladder cancer    Rectal cancer ([place]) 02/2021    SURGICAL HISTORY: Past Surgical  History:  Procedure Laterality Date   COLONOSCOPY     EUS N/A 04/27/2021   Procedure: LOWER ENDOSCOPIC ULTRASOUND (EUS);  Surgeon: [name], MD;  Location: [facility];  Service: Endoscopy;  Laterality: N/A;   FLEXIBLE SIGMOIDOSCOPY N/A 04/27/2021   Procedure: FLEXIBLE SIGMOIDOSCOPY;  Surgeon: [name], MD;  Location: [facility];  Service: Endoscopy;  Laterality: N/A;   FLEXIBLE SIGMOIDOSCOPY N/A 01/12/2022   Procedure: FLEXIBLE SIGMOIDOSCOPY;  Surgeon: Ileana Roup, MD;  Location: [facility];  Service: General;  Laterality: N/A;   ILEO LOOP DIVERSION N/A 01/12/2022   Procedure: DIVERTING LOOP ILEOSTOMY;  Surgeon: Ileana Roup, MD;  Location: [facility];  Service: General;  Laterality: N/A;   NO PAST SURGERIES     XI ROBOTIC ASSISTED LOWER ANTERIOR RESECTION N/A 01/12/2022   Procedure: XI ROBOTIC ASSISTED LOWER ANTERIOR RESECTION;  Surgeon: Ileana Roup, MD;  Location: [facility];  Service: General;  Laterality: N/A;    I have reviewed the social history and family history with the patient and they are unchanged from previous note.  ALLERGIES:  is allergic to shellfish allergy.  MEDICATIONS:  Current Outpatient Medications  Medication Sig Dispense Refill   escitalopram (LEXAPRO) 10 MG tablet Take 5 mg by mouth at bedtime.     gabapentin (NEURONTIN) 100 MG capsule Take 1 capsule (100 mg total) by mouth at bedtime. Ok to increase t0 3 capsules at night if needed and tolerates well (Patient taking differently: Take 100 mg by mouth daily as needed (for nerve pain in feet). Ok to increase t0 3 capsules at night if needed and tolerates well) 90 capsule 0   omeprazole (PRILOSEC) 20 MG capsule Take 1 capsule (20 mg total) by mouth daily. (Patient taking differently: Take 20 mg by mouth daily as needed (for heartburn).) 30 capsule 0   No current facility-administered medications for this visit.    PHYSICAL EXAMINATION: ECOG PERFORMANCE STATUS: 2 - Symptomatic, <50%  confined to bed  Vitals:   02/01/22 1007  BP: (!) 134/99  Pulse: 79  Resp: 17  Temp: 98.6 F (37 C)  SpO2: 98%   Wt Readings from Last 3 Encounters:  02/01/22 179 lb 4.8 oz (81.3 kg)  01/30/22 175 lb 4.3 oz (79.5 kg)  01/19/22 180 lb (81.6 kg)     GENERAL:alert, no distress and comfortable SKIN: skin color normal, no rashes or significant lesions EYES: normal, Conjunctiva are pink and non-injected, sclera clear  NEURO: alert & oriented x 3 with fluent speech  LABORATORY DATA:  I have reviewed the data as listed    Latest Ref Rng & Units 02/01/2022    9:46 AM 01/31/2022    4:26 AM 01/30/2022    4:25 AM  CBC  WBC 4.0 - 10.5 K/uL 4.1  3.2  10.4   Hemoglobin 13.0 - 17.0 g/dL 14.9  13.9  17.4   Hematocrit 39.0 - 52.0 % 42.4  42.7  51.8   Platelets 150 - 400 K/uL 331  321  409  Latest Ref Rng & Units 02/01/2022    9:46 AM 01/31/2022    4:26 AM 01/30/2022    4:25 AM  CMP  Glucose 70 - 99 mg/dL 103  97  113   BUN 6 - 20 mg/dL 9  14  16    Creatinine 0.61 - 1.24 mg/dL 1.13  1.07  1.16   Sodium 135 - 145 mmol/L 137  139  137   Potassium 3.5 - 5.1 mmol/L 4.0  4.7  4.2   Chloride 98 - 111 mmol/L 106  108  105   CO2 22 - 32 mmol/L 28  26  21    Calcium 8.9 - 10.3 mg/dL 9.8  8.7  9.9   Total Protein 6.5 - 8.1 g/dL 7.1   8.2   Total Bilirubin 0.3 - 1.2 mg/dL 0.5   0.9   Alkaline Phos 38 - 126 U/L 72   79   AST 15 - 41 U/L 22   23   ALT 0 - 44 U/L 23   25       RADIOGRAPHIC STUDIES: I have personally reviewed the radiological images as listed and agreed with the findings in the report. No results found.    No orders of the defined types were placed in this encounter.  All questions were answered. The patient knows to call the clinic with any problems, questions or concerns. No barriers to learning was detected. The total time spent in the appointment was 30 minutes.     [name], MD 02/01/2022   I, Wilburn Mylar, am acting as scribe for [name], MD.   I have  reviewed the above documentation for accuracy and completeness, and I agree with the above.

## 2022-02-01 NOTE — Progress Notes (Signed)
MD requesting nutrition add on today.  Patient is a 46 year old male diagnosed with rectal cancer status post concurrent chemoradiation therapy with Xeloda.  He is status post resection in August 4193 which was complicated by 2 bowel obstructions.  He has an ileostomy.  Patient verbalizes frustrations.  States he cannot tolerate solids of any kind.  States he will never eat red meat again.  He was told by Dr. Dema Severin not to take MiraLAX.  Reports ostomy is not working.  Patient confirms he was told to follow a low fiber diet with oral nutrition supplements.  He has tried these in the past and was not thrilled with them but is willing to accept supplements today.  I was able to provide patient with a variety of flavors of both Ensure and boost along with some clear liquid supplements.  Provided coupons.  Also provided updated copy of low fiber diet.  I encourage patient to contact me with questions or concerns and provided contact information.  **Disclaimer: This note was dictated with voice recognition software. Similar sounding words can inadvertently be transcribed and this note may contain transcription errors which may not have been corrected upon publication of note.**

## 2022-02-02 ENCOUNTER — Other Ambulatory Visit: Payer: Self-pay

## 2022-02-02 NOTE — Discharge Summary (Signed)
Patient ID: Jerry Allison MRN: [id number] DOB/AGE: 1975-09-16 46 y.o.  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 This resolved and began having significant output from his ileostomy . On 01/31/22 he was tolerating solid foods without n/v/distention and ostomy output picked back up. He was comfortable with and stable for discharge home.    Allergies as of 01/31/2022       Reactions   Shellfish Allergy Anaphylaxis        Medication List     TAKE these medications    escitalopram 10 MG tablet Commonly known as: LEXAPRO Take 5 mg by mouth at bedtime.   gabapentin 100 MG capsule Commonly known as: Neurontin Take 1 capsule (100 mg total) by mouth at bedtime. Ok to increase t0 3 capsules at night if needed and tolerates well What changed:  when to take this reasons to take this   omeprazole 20 MG capsule Commonly known as: PRILOSEC Take 1 capsule (20 mg total) by mouth daily. What changed:  when to take this reasons to take this          Follow-up Information     Ileana Roup, MD Follow up in 2 week(s).   Specialties: General Surgery, Colon and Rectal Surgery Contact information: [place] [phone] [place] Dema Severin, M.D. [facility]

## 2022-02-08 ENCOUNTER — Telehealth: Payer: Self-pay | Admitting: Hematology

## 2022-02-08 NOTE — Telephone Encounter (Signed)
Scheduled follow-up appointment per 9/7 los. Patient is aware. Mailed calendar.

## 2022-02-09 ENCOUNTER — Other Ambulatory Visit: Payer: Self-pay

## 2022-02-13 ENCOUNTER — Other Ambulatory Visit (HOSPITAL_COMMUNITY): Payer: Self-pay | Admitting: Nurse Practitioner

## 2022-02-13 DIAGNOSIS — Z432 Encounter for attention to ileostomy: Secondary | ICD-10-CM

## 2022-02-13 DIAGNOSIS — L24B3 Irritant contact dermatitis related to fecal or urinary stoma or fistula: Secondary | ICD-10-CM

## 2022-02-26 ENCOUNTER — Other Ambulatory Visit: Payer: Self-pay

## 2022-02-28 ENCOUNTER — Encounter (HOSPITAL_COMMUNITY): Payer: Self-pay | Admitting: Surgery

## 2022-03-07 ENCOUNTER — Other Ambulatory Visit: Payer: Self-pay

## 2022-03-07 ENCOUNTER — Ambulatory Visit (HOSPITAL_COMMUNITY): Payer: BC Managed Care – PPO | Admitting: Anesthesiology

## 2022-03-07 ENCOUNTER — Encounter (HOSPITAL_COMMUNITY): Payer: Self-pay | Admitting: Surgery

## 2022-03-07 ENCOUNTER — Encounter (HOSPITAL_COMMUNITY)
Admission: RE | Disposition: A | Discharge status: Home / Self Care | Payer: Self-pay | Source: Home / Self Care | Attending: Surgery

## 2022-03-07 ENCOUNTER — Ambulatory Visit (HOSPITAL_COMMUNITY)
Admission: RE | Admit: 2022-03-07 | Discharge: 2022-03-07 | Disposition: A | Discharge status: Home / Self Care | Payer: BC Managed Care – PPO | Attending: Surgery | Admitting: Surgery

## 2022-03-07 DIAGNOSIS — Z01818 Encounter for other preprocedural examination: Secondary | ICD-10-CM | POA: Diagnosis not present

## 2022-03-07 DIAGNOSIS — Z85048 Personal history of other malignant neoplasm of rectum, rectosigmoid junction, and anus: Secondary | ICD-10-CM | POA: Insufficient documentation

## 2022-03-07 DIAGNOSIS — Z980 Intestinal bypass and anastomosis status: Secondary | ICD-10-CM | POA: Diagnosis not present

## 2022-03-07 HISTORY — PX: FLEXIBLE SIGMOIDOSCOPY: SHX5431

## 2022-03-07 SURGERY — SIGMOIDOSCOPY, FLEXIBLE
Anesthesia: Monitor Anesthesia Care

## 2022-03-07 MED ORDER — LACTATED RINGERS IV SOLN
INTRAVENOUS | Status: AC | PRN
  Administered 2022-03-07: 1000 mL via INTRAVENOUS

## 2022-03-07 MED ORDER — PROPOFOL 500 MG/50ML IV EMUL
INTRAVENOUS | Status: DC | PRN
  Administered 2022-03-07: 150 ug/kg/min via INTRAVENOUS

## 2022-03-07 NOTE — Anesthesia Preprocedure Evaluation (Addendum)
Anesthesia Evaluation  Patient identified by MRN, date of birth, ID band Patient awake    Reviewed: Allergy & Precautions, NPO status , Patient's Chart, lab work & pertinent test results  History of Anesthesia Complications Negative for: history of anesthetic complications  Airway Mallampati: II  TM Distance: >3 FB Neck ROM: Full    Dental no notable dental hx.    Pulmonary neg pulmonary ROS,    Pulmonary exam normal        Cardiovascular negative cardio ROS Normal cardiovascular exam     Neuro/Psych Anxiety Depression negative neurological ROS     GI/Hepatic GERD  Medicated and Controlled,(+)     substance abuse  marijuana use, Rectal ca   Endo/Other  negative endocrine ROS  Renal/GU Cr 1.29  negative genitourinary   Musculoskeletal negative musculoskeletal ROS (+)   Abdominal   Peds  Hematology negative hematology ROS (+)   Anesthesia Other Findings Day of surgery medications reviewed with patient.  Rectal Cancer  Reproductive/Obstetrics negative OB ROS                             Anesthesia Physical  Anesthesia Plan  ASA: 3  Anesthesia Plan: MAC   Post-op Pain Management: Minimal or no pain anticipated   Induction: Intravenous  PONV Risk Score and Plan: 1 and Treatment may vary due to age or medical condition and Ondansetron  Airway Management Planned: Simple Face Mask  Additional Equipment: None  Intra-op Plan:   Post-operative Plan:   Informed Consent: I have reviewed the patients History and Physical, chart, labs and discussed the procedure including the risks, benefits and alternatives for the proposed anesthesia with the patient or authorized representative who has indicated his/her understanding and acceptance.     Dental advisory given  Plan Discussed with: CRNA  Anesthesia Plan Comments:        Anesthesia Quick Evaluation

## 2022-03-07 NOTE — Anesthesia Postprocedure Evaluation (Signed)
Anesthesia Post Note  Patient: Yolanda Dockendorf  Procedure(s) Performed: [place]     Patient location during evaluation: PACU Anesthesia Type: MAC Level of consciousness: awake and alert Pain management: pain level controlled Vital Signs Assessment: post-procedure vital signs reviewed and stable Respiratory status: spontaneous breathing, nonlabored ventilation and respiratory function stable Cardiovascular status: blood pressure returned to baseline and stable Postop Assessment: no apparent nausea or vomiting Anesthetic complications: no   No notable events documented.  Last Vitals:  Vitals:   03/07/22 1220 03/07/22 1230  BP: 115/73 113/81  Pulse: 61 71  Resp: 13 15  Temp:    SpO2: 100% 97%    Last Pain:  Vitals:   03/07/22 1240  TempSrc:   PainSc: 0-No pain                 Lynda Rainwater

## 2022-03-07 NOTE — H&P (Signed)
CC: Here for flexible sigmoidoscopy  HPI: Jerry Allison is an 46 y.o. male whom underwent colonoscopy 03/07/2021 with Dr. [name] where he was found to have a malignant appearing partially obstructing tumor in the proximal rectum. Biopsied, tattooed. 3 mm polyp in the distal rectum removed. Remainder of the colon was normal. Tattoo was noted to be just proximal to the mass.  Pathology demonstrated adenocarcinoma. Polyp returned as a tubular adenoma.  CT chest/abdomen/pelvis 03/09/2021 showed asymmetrical thickening in the proximal/mid rectum. No evidence of metastatic disease.  He reports his symptoms preceding this were primarily that of some crampy lower abdominal pains for few months as well as hematochezia.  MRI 03/27/21 - "1. Low sigmoid primary, greater than 15 cm from the anal verge, as detailed above. Poorly delineated secondary to underdistention in this region. 2. No evidence of pelvic nodal metastasis. 3. Trace perisigmoid fluid, new since the prior CT."   [id number] stage II Dr. Ardis Hughs - favored rectal primary. CAPOX 05/10/21 initiated. Subsequent chemoXRT 10/09/21, finishing ~11/15/21. He denies any complaints today.    CT chest/abdomen/pelvis 11/14/2021 showed no evident progressive or metastatic disease Pelvic MRI 01/01/22 -response to therapy of low sigmoid/rectosigmoid junction primary. Focal area of under distention and mild wall thickening may all be related to treatment. No well-defined mass. No pelvic adenopathy.  Had second opinion at [facility] with Dr. Morton Stall. Cannot clearly see a mass per se on his proctoscopy. He therefore underwent an urgent flexible sigmoidoscopy with Dr. Havery Moros. He was found to have a severe stenosis with an inner diameter of 3 mm in the proximalmost aspect of the rectum. This was 15 cm from the anal verge and was not traversable. Biopsies were taken and did not demonstrate any evident cancerous type tissue. 4 mm polyp in the rectum. The  dense stricture was not traversable. Therefore, could not adequately evaluate the portion of rectum within the stricture or proximal to it.  We had spent time reviewing all this with him. We discussed that we cannot adequately survey the strictured portion or anything proximal to it. With this having an inner diameter also 3 mm, not being amenable to surveillance. We therefore had discussed proceeding with surgery and he was in agreement with this plan.  OR 01/12/2022- Robotic assisted low anterior resection with double stapled colorectal anastomosis with Diverting loop ileostomy Diagnostic flexible sigmoidoscopy to localize lesion Bilateral transversus abdominus plane (TAP) blocks  FINDINGS: No evident metastatic disease on visceral nor parietal peritoneum or liver surface. Tattoo in the proximal rectum and in the mid rectum. No obvious mass. Diagnostic flexible sigmoidoscopy was therefore carried out to localize the intraluminal location of the lesion. There is a relatively dense stricture between the upper valve of [name] in the mid valve of [name]. We are able to localize an approximate 5 cm distal margin and marked this endoscopically. Following this, a low anterior resection was carried out with a double stapled colorectal anastomosis. A well perfused, tension free, hemostatic, air tight 31 mm EEA colorectal anastomosis fashioned 8 cm from the anal verge by flexible sigmoidoscopy.  PATH:  A. COLON, RECTOSIGMOID, RESECTION:  - Fibrosis and mild chronic inflammation consistent with treatment  effect.  - No residual carcinoma (ypT0).  - All surgical margins negative for tumor.  - Eighteen lymph nodes negative for metastatic carcinoma (0/18)(ypN0).  - See oncology table.   B. DISTAL DONUT:  - Benign colon.  - Negative for malignancy.  Case was represented at multidisciplinary tumor board with plans for ongoing  surveillance moving forward.  He had recovered from his initial procedure and  was discharged home. In the interim, he has been readmitted twice with short-lived partial small bowel obstruction type picture. Our leading hypothesis at present is that it is likely related to a food bolus type issue as these typically resolve within 24 hours. He was recently discharged from the hospital earlier this week.  INTERVAL HX No further obstructions. Doing great. Denies changes in health/health hx since we met last.  PMH: Denies  PSH: Denies  FHx: Denies any known family history of colorectal, breast, endometrial or ovarian cancer  Social Hx: Denies use of tobacco/illicit drug. Social EtOH use. He works in a [employer]. He is here today with his wife. He reports they do have children.  Past Medical History:  Diagnosis Date   Anxiety    Depression    Family history of bladder cancer    Rectal cancer ([place]) 02/2021    Past Surgical History:  Procedure Laterality Date   COLONOSCOPY     EUS N/A 04/27/2021   Procedure: LOWER ENDOSCOPIC ULTRASOUND (EUS);  Surgeon: [name], MD;  Location: [facility];  Service: Endoscopy;  Laterality: N/A;   FLEXIBLE SIGMOIDOSCOPY N/A 04/27/2021   Procedure: FLEXIBLE SIGMOIDOSCOPY;  Surgeon: [name], MD;  Location: [facility];  Service: Endoscopy;  Laterality: N/A;   FLEXIBLE SIGMOIDOSCOPY N/A 01/12/2022   Procedure: FLEXIBLE SIGMOIDOSCOPY;  Surgeon: Ileana Roup, MD;  Location: [facility]S;  Service: General;  Laterality: N/A;   ILEO LOOP DIVERSION N/A 01/12/2022   Procedure: DIVERTING LOOP ILEOSTOMY;  Surgeon: Ileana Roup, MD;  Location: [facility]S;  Service: General;  Laterality: N/A;   NO PAST SURGERIES     XI ROBOTIC ASSISTED LOWER ANTERIOR RESECTION N/A 01/12/2022   Procedure: XI ROBOTIC ASSISTED LOWER ANTERIOR RESECTION;  Surgeon: Ileana Roup, MD;  Location: [facility];  Service: General;  Laterality: N/A;    Family History  Problem Relation Age of Onset   Anxiety disorder Father    Healthy Brother     Bladder Cancer Maternal Grandmother 45   Healthy Half-Sister        paternal half   Colon cancer Neg Hx    Esophageal cancer Neg Hx    Stomach cancer Neg Hx    Pancreatic cancer Neg Hx    Liver disease Neg Hx    Inflammatory bowel disease Neg Hx     Social:  reports that he has never smoked. He has never used smokeless tobacco. He reports current alcohol use. He reports current drug use. Drug: Marijuana.  Allergies:  Allergies  Allergen Reactions   Shellfish Allergy Anaphylaxis    Medications: I have reviewed the patient's current medications.  No results found for this or any previous visit (from the past 48 hour(s)).  No results found.  ROS - all of the below systems have been reviewed with the patient and positives are indicated with bold text General: chills, fever or night sweats Eyes: blurry vision or double vision ENT: epistaxis or sore throat Allergy/Immunology: itchy/watery eyes or nasal congestion Hematologic/Lymphatic: bleeding problems, blood clots or swollen lymph nodes Endocrine: temperature intolerance or unexpected weight changes Breast: new or changing breast lumps or nipple discharge Resp: cough, shortness of breath, or wheezing CV: chest pain or dyspnea on exertion GI: as per HPI GU: dysuria, trouble voiding, or hematuria MSK: joint pain or joint stiffness Neuro: TIA or stroke symptoms Derm: pruritus and skin lesion changes Psych: anxiety and depression  PE Blood pressure (!) 134/96, pulse 69, temperature 98.1 F (36.7 C), temperature source Oral, resp. rate 16, height 6' (1.829 m), weight 79.4 kg, SpO2 100 %. Constitutional: NAD; conversant Eyes: Moist conjunctiva Lungs: Normal respiratory effort CV: RRR GI: Abd soft, NT/ND Psychiatric: Appropriate affect  No results found for this or any previous visit (from the past 15 hour(s)).  No results found.   A/P: Jerry Allison is an 46 y.o. male with no medical hx here for flexible  sigmoidoscopy, preop planning prior to ileostomy reversal - in setting of robotic LAR/DLI following TNT for mid rectal cancer - 01/12/22  CEA 1.7 CT CAP 02/2021 - no evidence of metastatic dz MRI Pelvis 03/27/21 - 1. Low sigmoid primary, greater than 15 cm from the anal verge, as detailed above. Poorly delineated secondary to underdistention in this region. 2. No evidence of pelvic nodal metastasis. 3. Trace perisigmoid fluid, new since the prior CT.  Genetics negative  TNT - chemo completed; neoadjuvanct cXRT completion date 11/15/21  -We spent time today reviewing the relevant anatomy as it pertains to his current anatomy.  We discussed flexible sigmoidoscopy to assess his colorectal anastomosis prior to planned diverting loop ileostomy reversal surgery. -The planned procedure, material risks (including, but not limited to, bleeding, infection, perforation of intestine, damage to surrounding structures, need for additional procedures, pneumonia, aspiration, heart attack, stroke, death) benefits, and alternatives were reviewed.  We discussed rationale of the procedure.  He expresses understanding of all the above.  All of his questions were answered to his satisfaction.  He has agreed to proceed. -Witnessed informed consent was obtained.  Nadeen Landau, [facility], [place]

## 2022-03-07 NOTE — Op Note (Signed)
[facility] Patient Name: Jerry Allison Procedure Date: 03/07/2022 MRN: [id number] Attending MD: Ileana Roup MD, MD Date of Birth: Aug 11, 1975 CSN: [id number] Age: 46 Admit Type: Outpatient Procedure:                Flexible Sigmoidoscopy Indications:              Personal history of malignant rectal neoplasm,                            Preoperative assessment Providers:                Sharon [place]. Westyn Driggers MD, MD, Benay Pillow, RN,                            [facility] Technician, Technician, Brien Mates, RNFA Referring MD:              Medicines:                Monitored Anesthesia Care Complications:            No immediate complications. Estimated blood loss:                            None. Estimated Blood Loss:     Estimated blood loss: none. Procedure:                Pre-Anesthesia Assessment:                           - Prior to the procedure, a History and Physical                            was performed, and patient medications, allergies                            and sensitivities were reviewed. The patient's                            tolerance of previous anesthesia was reviewed.                           - The risks and benefits of the procedure and the                            sedation options and risks were discussed with the                            patient. All questions were answered and informed                            consent was obtained.                           - Patient identification and proposed procedure  were verified prior to the procedure by the                            physician, the nurse and the anesthetist. The                            procedure was verified in the pre-procedure area in                            the endoscopy suite.                           After obtaining informed consent, the scope was                            passed under direct  vision. The [id number]-[id number] ([id number])                            Olympus endoscope was introduced through the anus                            and advanced to the the splenic flexure. The                            flexible sigmoidoscopy was accomplished without                            difficulty. The patient tolerated the procedure                            well. The quality of the bowel preparation was                            adequate. Scope In: Scope Out: Findings:      The perianal and digital rectal examinations were normal. Pertinent       negatives include normal sphincter tone and no palpable rectal lesions.      There was evidence of a prior end-to-end colo-colonic anastomosis in the       mid rectum. This was patent and was characterized by healthy appearing       mucosa. There is no granulation tissue or evident sinus tracts. The       anastomosis was traversed. Estimated blood loss: none.      The exam was otherwise without abnormality. Impression:               - Patent end-to-end colo-colonic anastomosis,                            characterized by healthy appearing mucosa.                           - The examination was otherwise normal.                           - No specimens collected. Moderate Sedation:  Not Applicable - Patient had care per Anesthesia. Recommendation:           - Continue present medications.                           - Resume previous diet indefinitely. Procedure Code(s):        --- Professional ---                           [phone], Sigmoidoscopy, flexible; diagnostic,                            including collection of specimen(s) by brushing or                            washing, when performed (separate procedure) Diagnosis Code(s):        --- Professional ---                           Z98.0, Intestinal bypass and anastomosis status                           Z85.048, Personal history of other malignant                            neoplasm of  rectum, rectosigmoid junction, and anus                           Z01.818, Encounter for other preprocedural                            examination CPT copyright 2019 American Medical Association. All rights reserved. The codes documented in this report are preliminary and upon coder review may  be revised to meet current compliance requirements. Nadeen Landau, MD Ileana Roup MD, MD 03/07/2022 12:21:32 PM This report has been signed electronically. Number of Addenda: 0

## 2022-03-07 NOTE — Transfer of Care (Signed)
Immediate Anesthesia Transfer of Care Note  Patient: Jerry Allison  Procedure(s) Performed: FLEXIBLE SIGMOIDOSCOPY  Patient Location: PACU  Anesthesia Type:MAC  Level of Consciousness: awake, alert  and oriented  Airway & Oxygen Therapy: Patient Spontanous Breathing and Patient connected to face mask oxygen  Post-op Assessment: Report given to RN and Post -op Vital signs reviewed and stable  Post vital signs: Reviewed and stable  Last Vitals:  Vitals Value Taken Time  BP    Temp    Pulse    Resp    SpO2      Last Pain:  Vitals:   03/07/22 1051  TempSrc: Oral  PainSc: 0-No pain         Complications: No notable events documented.

## 2022-03-09 ENCOUNTER — Encounter (HOSPITAL_COMMUNITY): Payer: Self-pay | Admitting: Surgery

## 2022-03-20 ENCOUNTER — Other Ambulatory Visit (HOSPITAL_COMMUNITY): Payer: Self-pay

## 2022-03-20 NOTE — Progress Notes (Signed)
Sent message, via epic in basket, requesting orders in epic from surgeon.  

## 2022-03-21 ENCOUNTER — Other Ambulatory Visit (HOSPITAL_COMMUNITY): Payer: Self-pay | Admitting: *Deleted

## 2022-03-22 ENCOUNTER — Other Ambulatory Visit (HOSPITAL_COMMUNITY): Payer: Self-pay | Admitting: *Deleted

## 2022-03-23 ENCOUNTER — Other Ambulatory Visit (HOSPITAL_COMMUNITY): Payer: Self-pay

## 2022-03-23 NOTE — Progress Notes (Signed)
Second request for orders: spoke with Jerry Allison

## 2022-03-26 ENCOUNTER — Ambulatory Visit: Payer: Self-pay | Admitting: Surgery

## 2022-03-26 ENCOUNTER — Other Ambulatory Visit (HOSPITAL_COMMUNITY): Payer: Self-pay | Admitting: *Deleted

## 2022-03-27 ENCOUNTER — Other Ambulatory Visit: Payer: Self-pay

## 2022-03-27 ENCOUNTER — Encounter (HOSPITAL_COMMUNITY): Payer: Self-pay | Admitting: Surgery

## 2022-03-27 ENCOUNTER — Encounter (HOSPITAL_COMMUNITY)
Admission: RE | Admit: 2022-03-27 | Discharge: 2022-03-27 | Disposition: A | Discharge status: Home / Self Care | Payer: BC Managed Care – PPO | Source: Ambulatory Visit | Attending: Surgery | Admitting: Surgery

## 2022-03-27 ENCOUNTER — Other Ambulatory Visit (HOSPITAL_COMMUNITY): Payer: Self-pay | Admitting: *Deleted

## 2022-03-27 DIAGNOSIS — Z01812 Encounter for preprocedural laboratory examination: Secondary | ICD-10-CM | POA: Insufficient documentation

## 2022-03-27 LAB — CBC
HCT: 45.2 % (ref 39.0–52.0)
Hemoglobin: 15 g/dL (ref 13.0–17.0)
MCH: 29.6 pg (ref 26.0–34.0)
MCHC: 33.2 g/dL (ref 30.0–36.0)
MCV: 89.3 fL (ref 80.0–100.0)
Platelets: 284 10*3/uL (ref 150–400)
RBC: 5.06 MIL/uL (ref 4.22–5.81)
RDW: 12.8 % (ref 11.5–15.5)
WBC: 4.3 10*3/uL (ref 4.0–10.5)
nRBC: 0 % (ref 0.0–0.2)

## 2022-03-27 NOTE — Progress Notes (Addendum)
SURGICAL WAITING ROOM VISITATION Patients having surgery or a procedure may have no more than 2 support people in the waiting area - these visitors may rotate.   Children under the age of 66 must have an adult with them who is not the patient. If the patient needs to stay at the hospital during part of their recovery, the visitor guidelines for inpatient rooms apply. Pre-op nurse will coordinate an appropriate time for 1 support person to accompany patient in pre-op.  This support person may not rotate.    Please refer to the [facility] website for the visitor guidelines for Inpatients (after your surgery is over and you are in a regular room).       Your procedure is scheduled on:   04/04/2022    Report to [facility] Main Entrance    Report to admitting at     0930     AM   Call this number if you have problems the morning of surgery [phone]   Clear liquid diet on day of bowel prep;    After Midnight you may have the following liquids until __ 0830____ AM/DAY OF SURGERY  Water Non-Citrus Juices (without pulp, NO RED) Carbonated Beverages Black Coffee (NO MILK/CREAM OR CREAMERS, sugar ok)  Clear Tea (NO MILK/CREAM OR CREAMERS, sugar ok) regular and decaf                             Plain Jell-O (NO RED)                                           Fruit ices (not with fruit pulp, NO RED)                                     Popsicles (NO RED)                                                               Sports drinks like Gatorade (NO RED)              Drink 2 Ensure/G2 drinks AT 10:00 PM the night before surgery.        The day of surgery:  Drink ONE (1) Pre-Surgery Clear Ensure or G2 at  0830 AM  ( have completed by) the morning of surgery. Drink in one sitting. Do not sip.  This drink was given to you during your hospital  pre-op appointment visit. Nothing else to drink after completing the  Pre-Surgery Clear Ensure or G2.          If you have questions, please  contact your surgeon's office.   FOLLOW BOWEL PREP AND ANY ADDITIONAL PRE OP INSTRUCTIONS YOU RECEIVED FROM YOUR SURGEON'S OFFICE!!!     Oral Hygiene is also important to reduce your risk of infection.                                    Remember - BRUSH YOUR TEETH  THE MORNING OF SURGERY WITH YOUR REGULAR TOOTHPASTE   Do NOT smoke after            Do not Vape or use any marijuana for at least 24 hours prior to surgery.     Take these medicines the morning of surgery with A SIP OF WATER none    DO NOT TAKE ANY ORAL DIABETIC MEDICATIONS DAY OF YOUR SURGERY  Bring CPAP mask and tubing day of surgery.                              You may not have any metal on your body including hair pins, jewelry, and body piercing             Do not wear make-up, lotions, powders, perfumes/cologne, or deodorant  Do not wear nail polish including gel and S&S, artificial/acrylic nails, or any other type of covering on natural nails including finger and toenails. If you have artificial nails, gel coating, etc. that needs to be removed by a nail salon please have this removed prior to surgery or surgery may need to be canceled/ delayed if the surgeon/ anesthesia feels like they are unable to be safely monitored.   Do not shave  48 hours prior to surgery.               Men may shave face and neck.   Do not bring valuables to the hospital. [place].   Contacts, dentures or bridgework may not be worn into surgery.   Bring small overnight bag day of surgery.   DO NOT [place]. PHARMACY WILL DISPENSE MEDICATIONS LISTED ON YOUR MEDICATION LIST TO YOU DURING YOUR ADMISSION [place]!    Patients discharged on the day of surgery will not be allowed to drive home.  Someone NEEDS to stay with you for the first 24 hours after anesthesia.   Special Instructions: Bring a copy of your healthcare power of attorney and living  will documents the day of surgery if you haven't scanned them before.              Please read over the following fact sheets you were given: IF [place] [phone]   If you received a COVID test during your pre-op visit  it is requested that you wear a mask when out in public, stay away from anyone that may not be feeling well and notify your surgeon if you develop symptoms. If you test positive for Covid or have been in contact with anyone that has tested positive in the last 10 days please notify you surgeon.     [place] - Preparing for Surgery Before surgery, you can play an important role.  Because skin is not sterile, your skin needs to be as free of germs as possible.  You can reduce the number of germs on your skin by washing with CHG (chlorahexidine gluconate) soap before surgery.  CHG is an antiseptic cleaner which kills germs and bonds with the skin to continue killing germs even after washing. Please DO NOT use if you have an allergy to CHG or antibacterial soaps.  If your skin becomes reddened/irritated stop using the CHG and inform your nurse when you arrive at Short Stay. Do not  shave (including legs and underarms) for at least 48 hours prior to the first CHG shower.  You may shave your face/neck. Please follow these instructions carefully:  1.  Shower with CHG Soap the night before surgery and the  morning of Surgery.  2.  If you choose to wash your hair, wash your hair first as usual with your  normal  shampoo.  3.  After you shampoo, rinse your hair and body thoroughly to remove the  shampoo.                           4.  Use CHG as you would any other liquid soap.  You can apply chg directly  to the skin and wash                       Gently with a scrungie or clean washcloth.  5.  Apply the CHG Soap to your body ONLY FROM THE NECK DOWN.   Do not use on face/ open                           Wound or open sores. Avoid  contact with eyes, ears mouth and genitals (private parts).                       Wash face,  Genitals (private parts) with your normal soap.             6.  Wash thoroughly, paying special attention to the area where your surgery  will be performed.  7.  Thoroughly rinse your body with warm water from the neck down.  8.  DO NOT shower/wash with your normal soap after using and rinsing off  the CHG Soap.                9.  Pat yourself dry with a clean towel.            10.  Wear clean pajamas.            11.  Place clean sheets on your bed the night of your first shower and do not  sleep with pets. Day of Surgery : Do not apply any lotions/deodorants the morning of surgery.  Please wear clean clothes to the hospital/surgery center.  FAILURE TO FOLLOW THESE INSTRUCTIONS MAY RESULT IN THE CANCELLATION OF YOUR SURGERY PATIENT SIGNATURE_________________________________  NURSE SIGNATURE__________________________________  ________________________________________________________________________

## 2022-03-27 NOTE — Progress Notes (Addendum)
Anesthesia Review:  PCP: Derinda Late  Cardiologist : none  Chest x-ray : 01/30/22- 1 view  EKG  01/30/22  Echo : Stress test: Cardiac Cath :  Activity level: can do a flgiht of stairs without difficutly  Sleep Study/ CPAP : none  Fasting Blood Sugar :      / Checks Blood Sugar -- times a day:   Blood Thinner/ Instructions /Last Dose: ASA / Instructions/ Last Dose :   PT forgot appt on 03/27/22.  Completed med hx and preop instrucitons via phone. PT stated at phone call that he did not have any bowel prep instructions.  Instructed pt at phone call to ask Dr Dema Severin at appt on 10/31/ at 1015 am in regards to bowel prep.   Pt then came for labs and to pick up instructioins and drink and soap after DR Dema Severin. Appt.  Pt stated that at office he was only instructed to do clear liquids day before surgery.

## 2022-04-04 ENCOUNTER — Inpatient Hospital Stay (HOSPITAL_COMMUNITY)
Admission: RE | Admit: 2022-04-04 | Discharge: 2022-04-08 | DRG: 331 | Disposition: A | Discharge status: Home / Self Care | Payer: BC Managed Care – PPO | Attending: Surgery | Admitting: Surgery

## 2022-04-04 ENCOUNTER — Encounter (HOSPITAL_COMMUNITY)
Admission: RE | Disposition: A | Discharge status: Home / Self Care | Payer: Self-pay | Source: Home / Self Care | Attending: Surgery

## 2022-04-04 ENCOUNTER — Encounter (HOSPITAL_COMMUNITY): Payer: Self-pay | Admitting: Physician Assistant

## 2022-04-04 ENCOUNTER — Encounter (HOSPITAL_COMMUNITY): Payer: Self-pay | Admitting: Surgery

## 2022-04-04 ENCOUNTER — Other Ambulatory Visit: Payer: Self-pay

## 2022-04-04 ENCOUNTER — Encounter (HOSPITAL_COMMUNITY): Payer: BC Managed Care – PPO | Admitting: Anesthesiology

## 2022-04-04 DIAGNOSIS — Z85048 Personal history of other malignant neoplasm of rectum, rectosigmoid junction, and anus: Secondary | ICD-10-CM

## 2022-04-04 DIAGNOSIS — K219 Gastro-esophageal reflux disease without esophagitis: Secondary | ICD-10-CM | POA: Diagnosis present

## 2022-04-04 DIAGNOSIS — Z79899 Other long term (current) drug therapy: Secondary | ICD-10-CM | POA: Diagnosis not present

## 2022-04-04 DIAGNOSIS — Z432 Encounter for attention to ileostomy: Secondary | ICD-10-CM

## 2022-04-04 DIAGNOSIS — Z9889 Other specified postprocedural states: Secondary | ICD-10-CM | POA: Diagnosis present

## 2022-04-04 DIAGNOSIS — Z818 Family history of other mental and behavioral disorders: Secondary | ICD-10-CM

## 2022-04-04 DIAGNOSIS — Z8052 Family history of malignant neoplasm of bladder: Secondary | ICD-10-CM | POA: Diagnosis not present

## 2022-04-04 DIAGNOSIS — F32A Depression, unspecified: Secondary | ICD-10-CM | POA: Diagnosis present

## 2022-04-04 DIAGNOSIS — F419 Anxiety disorder, unspecified: Secondary | ICD-10-CM | POA: Diagnosis present

## 2022-04-04 DIAGNOSIS — Z91013 Allergy to seafood: Secondary | ICD-10-CM

## 2022-04-04 DIAGNOSIS — Z86010 Personal history of colonic polyps: Secondary | ICD-10-CM | POA: Diagnosis not present

## 2022-04-04 DIAGNOSIS — Z01818 Encounter for other preprocedural examination: Principal | ICD-10-CM

## 2022-04-04 HISTORY — PX: ILEOSTOMY CLOSURE: SHX1784

## 2022-04-04 SURGERY — CLOSURE, ILEOSTOMY
Anesthesia: General

## 2022-04-04 MED ORDER — FENTANYL CITRATE (PF) 100 MCG/2ML IJ SOLN
INTRAMUSCULAR | Status: DC | PRN
  Administered 2022-04-04 (×5): 50 ug via INTRAVENOUS

## 2022-04-04 MED ORDER — PROPOFOL 10 MG/ML IV BOLUS
INTRAVENOUS | Status: DC | PRN
  Administered 2022-04-04: 200 mg via INTRAVENOUS

## 2022-04-04 MED ORDER — HEPARIN SODIUM (PORCINE) 5000 UNIT/ML IJ SOLN
5000.0000 [IU] | Freq: Three times a day (TID) | INTRAMUSCULAR | Status: DC
  Administered 2022-04-05 – 2022-04-08 (×10): 5000 [IU] via SUBCUTANEOUS
  Filled 2022-04-04 (×10): qty 1

## 2022-04-04 MED ORDER — GLYCOPYRROLATE 0.2 MG/ML IJ SOLN
INTRAMUSCULAR | Status: DC | PRN
  Administered 2022-04-04: .2 mg via INTRAVENOUS

## 2022-04-04 MED ORDER — ALVIMOPAN 12 MG PO CAPS
12.0000 mg | ORAL_CAPSULE | Freq: Two times a day (BID) | ORAL | Status: DC
  Administered 2022-04-05 – 2022-04-07 (×5): 12 mg via ORAL
  Filled 2022-04-04 (×5): qty 1

## 2022-04-04 MED ORDER — BUPIVACAINE LIPOSOME 1.3 % IJ SUSP
INTRAMUSCULAR | Status: DC | PRN
  Administered 2022-04-04: 15 mL

## 2022-04-04 MED ORDER — STERILE WATER FOR IRRIGATION IR SOLN
Status: DC | PRN
  Administered 2022-04-04: 1000 mL

## 2022-04-04 MED ORDER — OXYCODONE HCL 5 MG/5ML PO SOLN: 5.0000 mg | Freq: Once | ORAL | Status: DC | PRN

## 2022-04-04 MED ORDER — HYDRALAZINE HCL 20 MG/ML IJ SOLN: 10.0000 mg | INTRAMUSCULAR | Status: DC | PRN

## 2022-04-04 MED ORDER — ACETAMINOPHEN 500 MG PO TABS
ORAL_TABLET | ORAL | Status: AC
  Filled 2022-04-04: qty 2

## 2022-04-04 MED ORDER — GABAPENTIN 300 MG PO CAPS
ORAL_CAPSULE | ORAL | Status: AC
  Filled 2022-04-04: qty 1

## 2022-04-04 MED ORDER — ESMOLOL HCL 100 MG/10ML IV SOLN
INTRAVENOUS | Status: DC | PRN
  Administered 2022-04-04: 30 mg via INTRAVENOUS
  Administered 2022-04-04: 20 mg via INTRAVENOUS

## 2022-04-04 MED ORDER — SUGAMMADEX SODIUM 200 MG/2ML IV SOLN
INTRAVENOUS | Status: DC | PRN
  Administered 2022-04-04: 200 mg via INTRAVENOUS
  Administered 2022-04-04: 50 mg via INTRAVENOUS

## 2022-04-04 MED ORDER — GABAPENTIN 100 MG PO CAPS: 100.0000 mg | ORAL_CAPSULE | Freq: Every evening | ORAL | Status: DC | PRN

## 2022-04-04 MED ORDER — PANTOPRAZOLE SODIUM 40 MG PO TBEC
40.0000 mg | DELAYED_RELEASE_TABLET | Freq: Every day | ORAL | Status: DC
  Administered 2022-04-04 – 2022-04-08 (×5): 40 mg via ORAL
  Filled 2022-04-04 (×5): qty 1

## 2022-04-04 MED ORDER — ALVIMOPAN 12 MG PO CAPS
12.0000 mg | ORAL_CAPSULE | ORAL | Status: AC
  Administered 2022-04-04: 12 mg via ORAL
  Filled 2022-04-04: qty 1

## 2022-04-04 MED ORDER — MEPERIDINE HCL 50 MG/ML IJ SOLN
6.2500 mg | INTRAMUSCULAR | Status: DC | PRN
  Administered 2022-04-04: 12.5 mg via INTRAVENOUS

## 2022-04-04 MED ORDER — OXYCODONE HCL 5 MG PO TABS: 5.0000 mg | ORAL_TABLET | Freq: Once | ORAL | Status: DC | PRN

## 2022-04-04 MED ORDER — HYDROMORPHONE HCL 1 MG/ML IJ SOLN
INTRAMUSCULAR | Status: DC | PRN
  Administered 2022-04-04: 1 mg via INTRAVENOUS

## 2022-04-04 MED ORDER — HEPARIN SODIUM (PORCINE) 5000 UNIT/ML IJ SOLN
5000.0000 [IU] | Freq: Once | INTRAMUSCULAR | Status: AC
  Administered 2022-04-04: 5000 [IU] via SUBCUTANEOUS
  Filled 2022-04-04: qty 1

## 2022-04-04 MED ORDER — ENSURE PRE-SURGERY PO LIQD
296.0000 mL | Freq: Once | ORAL | Status: DC
  Filled 2022-04-04: qty 296

## 2022-04-04 MED ORDER — ONDANSETRON HCL 4 MG PO TABS
4.0000 mg | ORAL_TABLET | Freq: Four times a day (QID) | ORAL | Status: DC | PRN
  Administered 2022-04-06: 4 mg via ORAL
  Filled 2022-04-04: qty 1

## 2022-04-04 MED ORDER — GABAPENTIN 300 MG PO CAPS
300.0000 mg | ORAL_CAPSULE | Freq: Once | ORAL | Status: AC
  Administered 2022-04-04: 300 mg via ORAL

## 2022-04-04 MED ORDER — MEPERIDINE HCL 50 MG/ML IJ SOLN
INTRAMUSCULAR | Status: AC
  Filled 2022-04-04: qty 1

## 2022-04-04 MED ORDER — TRAMADOL HCL 50 MG PO TABS
50.0000 mg | ORAL_TABLET | Freq: Four times a day (QID) | ORAL | Status: DC | PRN
  Administered 2022-04-05 – 2022-04-07 (×6): 50 mg via ORAL
  Filled 2022-04-04 (×7): qty 1

## 2022-04-04 MED ORDER — DIPHENHYDRAMINE HCL 12.5 MG/5ML PO ELIX: 12.5000 mg | ORAL_SOLUTION | Freq: Four times a day (QID) | ORAL | Status: DC | PRN

## 2022-04-04 MED ORDER — SODIUM CHLORIDE 0.9 % IV SOLN
2.0000 g | INTRAVENOUS | Status: AC
  Administered 2022-04-04: 2 g via INTRAVENOUS
  Filled 2022-04-04: qty 2

## 2022-04-04 MED ORDER — LIDOCAINE HCL (CARDIAC) PF 100 MG/5ML IV SOSY
PREFILLED_SYRINGE | INTRAVENOUS | Status: DC | PRN
  Administered 2022-04-04: 100 mg via INTRAVENOUS

## 2022-04-04 MED ORDER — ACETAMINOPHEN 500 MG PO TABS
1000.0000 mg | ORAL_TABLET | ORAL | Status: AC
  Administered 2022-04-04: 1000 mg via ORAL

## 2022-04-04 MED ORDER — BUPIVACAINE-EPINEPHRINE (PF) 0.25% -1:200000 IJ SOLN
INTRAMUSCULAR | Status: DC | PRN
  Administered 2022-04-04: 25 mL

## 2022-04-04 MED ORDER — DIPHENHYDRAMINE HCL 50 MG/ML IJ SOLN: 12.5000 mg | Freq: Four times a day (QID) | INTRAMUSCULAR | Status: DC | PRN

## 2022-04-04 MED ORDER — MIDAZOLAM HCL 5 MG/5ML IJ SOLN
INTRAMUSCULAR | Status: DC | PRN
  Administered 2022-04-04 (×2): 1 mg via INTRAVENOUS

## 2022-04-04 MED ORDER — ROCURONIUM BROMIDE 100 MG/10ML IV SOLN
INTRAVENOUS | Status: DC | PRN
  Administered 2022-04-04: 70 mg via INTRAVENOUS

## 2022-04-04 MED ORDER — ACETAMINOPHEN 500 MG PO TABS: 1000.0000 mg | ORAL_TABLET | Freq: Once | ORAL | Status: DC

## 2022-04-04 MED ORDER — PROMETHAZINE HCL 25 MG/ML IJ SOLN: 6.2500 mg | INTRAMUSCULAR | Status: DC | PRN

## 2022-04-04 MED ORDER — HYDROMORPHONE HCL 1 MG/ML IJ SOLN: 0.2500 mg | INTRAMUSCULAR | Status: DC | PRN

## 2022-04-04 MED ORDER — BUPIVACAINE LIPOSOME 1.3 % IJ SUSP
INTRAMUSCULAR | Status: AC
  Filled 2022-04-04: qty 20

## 2022-04-04 MED ORDER — BUPIVACAINE-EPINEPHRINE (PF) 0.25% -1:200000 IJ SOLN
INTRAMUSCULAR | Status: AC
  Filled 2022-04-04: qty 30

## 2022-04-04 MED ORDER — ACETAMINOPHEN 500 MG PO TABS
1000.0000 mg | ORAL_TABLET | Freq: Four times a day (QID) | ORAL | Status: DC
  Administered 2022-04-04 – 2022-04-08 (×13): 1000 mg via ORAL
  Filled 2022-04-04 (×13): qty 2

## 2022-04-04 MED ORDER — ENSURE SURGERY PO LIQD
237.0000 mL | Freq: Two times a day (BID) | ORAL | Status: DC
  Administered 2022-04-04 – 2022-04-07 (×6): 237 mL via ORAL

## 2022-04-04 MED ORDER — SIMETHICONE 80 MG PO CHEW: 40.0000 mg | CHEWABLE_TABLET | Freq: Four times a day (QID) | ORAL | Status: DC | PRN

## 2022-04-04 MED ORDER — CHLORHEXIDINE GLUCONATE CLOTH 2 % EX PADS: 6.0000 | MEDICATED_PAD | Freq: Once | CUTANEOUS | Status: DC

## 2022-04-04 MED ORDER — ESCITALOPRAM OXALATE 10 MG PO TABS
5.0000 mg | ORAL_TABLET | Freq: Every day | ORAL | Status: DC
  Administered 2022-04-04 – 2022-04-07 (×4): 5 mg via ORAL
  Filled 2022-04-04 (×5): qty 1

## 2022-04-04 MED ORDER — ONDANSETRON HCL 4 MG/2ML IJ SOLN
INTRAMUSCULAR | Status: DC | PRN
  Administered 2022-04-04: 4 mg via INTRAVENOUS

## 2022-04-04 MED ORDER — ENSURE PRE-SURGERY PO LIQD
592.0000 mL | Freq: Once | ORAL | Status: DC
  Filled 2022-04-04: qty 592

## 2022-04-04 MED ORDER — DEXAMETHASONE SODIUM PHOSPHATE 10 MG/ML IJ SOLN
INTRAMUSCULAR | Status: DC | PRN
  Administered 2022-04-04: 8 mg via INTRAVENOUS

## 2022-04-04 MED ORDER — ONDANSETRON HCL 4 MG/2ML IJ SOLN
4.0000 mg | Freq: Four times a day (QID) | INTRAMUSCULAR | Status: DC | PRN
  Administered 2022-04-04 – 2022-04-07 (×8): 4 mg via INTRAVENOUS
  Filled 2022-04-04 (×9): qty 2

## 2022-04-04 MED ORDER — HYDROMORPHONE HCL 1 MG/ML IJ SOLN
0.5000 mg | INTRAMUSCULAR | Status: DC | PRN
  Administered 2022-04-04 – 2022-04-05 (×5): 0.5 mg via INTRAVENOUS
  Filled 2022-04-04 (×7): qty 0.5

## 2022-04-04 MED ORDER — ALUM & MAG HYDROXIDE-SIMETH 200-200-20 MG/5ML PO SUSP: 30.0000 mL | Freq: Four times a day (QID) | ORAL | Status: DC | PRN

## 2022-04-04 MED ORDER — LACTATED RINGERS IV SOLN: INTRAVENOUS | Status: DC

## 2022-04-04 MED ORDER — 0.9 % SODIUM CHLORIDE (POUR BTL) OPTIME
TOPICAL | Status: DC | PRN
  Administered 2022-04-04: 2000 mL

## 2022-04-04 MED ORDER — AMISULPRIDE (ANTIEMETIC) 5 MG/2ML IV SOLN: 10.0000 mg | Freq: Once | INTRAVENOUS | Status: DC | PRN

## 2022-04-04 MED ORDER — IBUPROFEN 400 MG PO TABS: 600.0000 mg | ORAL_TABLET | Freq: Four times a day (QID) | ORAL | Status: DC | PRN

## 2022-04-04 SURGICAL SUPPLY — 48 items
BAG COUNTER SPONGE SURGICOUNT (BAG) IMPLANT
BLADE HEX COATED 2.75 (ELECTRODE) ×1 IMPLANT
CHLORAPREP W/TINT 26 (MISCELLANEOUS) ×1 IMPLANT
COVER MAYO STAND STRL (DRAPES) ×1 IMPLANT
DRAPE LAPAROSCOPIC ABDOMINAL (DRAPES) ×1 IMPLANT
DRAPE UTILITY XL STRL (DRAPES) IMPLANT
DRAPE WARM FLUID 44X44 (DRAPES) ×1 IMPLANT
DRSG OPSITE POSTOP 4X10 (GAUZE/BANDAGES/DRESSINGS) IMPLANT
DRSG OPSITE POSTOP 4X6 (GAUZE/BANDAGES/DRESSINGS) IMPLANT
DRSG OPSITE POSTOP 4X8 (GAUZE/BANDAGES/DRESSINGS) IMPLANT
DRSG TELFA 3X8 NADH STRL (GAUZE/BANDAGES/DRESSINGS) IMPLANT
ELECT REM PT RETURN 15FT ADLT (MISCELLANEOUS) ×1 IMPLANT
GAUZE SPONGE 4X4 12PLY STRL (GAUZE/BANDAGES/DRESSINGS) ×1 IMPLANT
GLOVE BIOGEL PI IND STRL 7.0 (GLOVE) ×1 IMPLANT
GLOVE SURG SS PI 7.0 STRL IVOR (GLOVE) ×1 IMPLANT
GOWN STRL REUS W/ TWL XL LVL3 (GOWN DISPOSABLE) IMPLANT
GOWN STRL REUS W/TWL XL LVL3 (GOWN DISPOSABLE)
HANDLE SUCTION POOLE (INSTRUMENTS) ×1 IMPLANT
HOLDER FOLEY CATH W/STRAP (MISCELLANEOUS) IMPLANT
KIT BASIN OR (CUSTOM PROCEDURE TRAY) ×1 IMPLANT
KIT TURNOVER KIT A (KITS) IMPLANT
LIGASURE IMPACT 36 18CM CVD LR (INSTRUMENTS) IMPLANT
MANIFOLD NEPTUNE II (INSTRUMENTS) ×1 IMPLANT
PACK GENERAL/GYN (CUSTOM PROCEDURE TRAY) ×1 IMPLANT
RELOAD PROXIMATE 75MM BLUE (ENDOMECHANICALS) ×2 IMPLANT
RELOAD STAPLE 75 3.8 BLU REG (ENDOMECHANICALS) IMPLANT
STAPLER 90 3.5 STAND SLIM (STAPLE) ×1
STAPLER 90 3.5 STD SLIM (STAPLE) IMPLANT
STAPLER GUN LINEAR PROX 60 (STAPLE) IMPLANT
STAPLER PROXIMATE 75MM BLUE (STAPLE) IMPLANT
SUCTION POOLE HANDLE (INSTRUMENTS) ×1
SUT NOVA NAB DX-16 0-1 5-0 T12 (SUTURE) IMPLANT
SUT NOVA NAB GS-21 0 18 T12 DT (SUTURE) ×2 IMPLANT
SUT PROLENE 2 0 BLUE (SUTURE) IMPLANT
SUT SILK 2 0 (SUTURE) ×1
SUT SILK 2 0 SH CR/8 (SUTURE) ×1 IMPLANT
SUT SILK 2-0 18XBRD TIE 12 (SUTURE) ×1 IMPLANT
SUT SILK 3 0 (SUTURE) ×1
SUT SILK 3 0 SH CR/8 (SUTURE) ×1 IMPLANT
SUT SILK 3-0 18XBRD TIE 12 (SUTURE) ×1 IMPLANT
SUT VIC AB 2-0 SH 18 (SUTURE) IMPLANT
SUT VIC AB 2-0 SH 27 (SUTURE) ×2
SUT VIC AB 2-0 SH 27X BRD (SUTURE) ×2 IMPLANT
SUT VIC AB 2-0 UR6 27 (SUTURE) IMPLANT
SUT VIC AB 4-0 PS2 18 (SUTURE) ×1 IMPLANT
TOWEL OR 17X26 10 PK STRL BLUE (TOWEL DISPOSABLE) ×2 IMPLANT
TOWEL OR NON WOVEN STRL DISP B (DISPOSABLE) ×2 IMPLANT
YANKAUER SUCT BULB TIP NO VENT (SUCTIONS) ×1 IMPLANT

## 2022-04-04 NOTE — Anesthesia Preprocedure Evaluation (Addendum)
Anesthesia Evaluation  Patient identified by MRN, date of birth, ID band Patient awake    Reviewed: Allergy & Precautions, NPO status , Patient's Chart, lab work & pertinent test results  History of Anesthesia Complications Negative for: history of anesthetic complications  Airway Mallampati: I  TM Distance: >3 FB Neck ROM: Full    Dental  (+) Dental Advisory Given, Teeth Intact   Pulmonary neg pulmonary ROS   Pulmonary exam normal breath sounds clear to auscultation       Cardiovascular negative cardio ROS Normal cardiovascular exam Rhythm:Regular Rate:Normal     Neuro/Psych  PSYCHIATRIC DISORDERS Anxiety Depression    negative neurological ROS     GI/Hepatic ,GERD  Medicated and Controlled,,(+)     substance abuse  marijuana useRectal ca   Endo/Other  negative endocrine ROS    Renal/GU Cr 1.29     Musculoskeletal negative musculoskeletal ROS (+)    Abdominal   Peds  Hematology  (+) Blood dyscrasia, anemia   Anesthesia Other Findings Day of surgery medications reviewed with patient.  Rectal Cancer  Reproductive/Obstetrics                             Anesthesia Physical Anesthesia Plan  ASA: 2  Anesthesia Plan: General   Post-op Pain Management: Tylenol PO (pre-op)*, Gabapentin PO (pre-op)* and Lidocaine infusion*   Induction: Intravenous  PONV Risk Score and Plan: 4 or greater and Treatment may vary due to age or medical condition, Ondansetron, Dexamethasone and Midazolam  Airway Management Planned: Oral ETT  Additional Equipment: None  Intra-op Plan:   Post-operative Plan: Extubation in OR  Informed Consent: I have reviewed the patients History and Physical, chart, labs and discussed the procedure including the risks, benefits and alternatives for the proposed anesthesia with the patient or authorized representative who has indicated his/her understanding and  acceptance.     Dental advisory given  Plan Discussed with: CRNA  Anesthesia Plan Comments:         Anesthesia Quick Evaluation

## 2022-04-04 NOTE — Transfer of Care (Signed)
Immediate Anesthesia Transfer of Care Note  Patient: Jerry Allison  Procedure(s) Performed: ILEOSTOMY TAKEDOWN  Patient Location: PACU  Anesthesia Type:General  Level of Consciousness: awake, alert , oriented, and patient cooperative  Airway & Oxygen Therapy: Patient Spontanous Breathing and Patient connected to face mask oxygen  Post-op Assessment: Report given to RN and Post -op Vital signs reviewed and stable  Post vital signs: Reviewed and stable  Last Vitals:  Vitals Value Taken Time  BP 175/104 04/04/22 1218  Temp 36.3 C 04/04/22 1218  Pulse 72 04/04/22 1219  Resp 10 04/04/22 1219  SpO2 100 % 04/04/22 1219  Vitals shown include unvalidated device data.  Last Pain:  Vitals:   04/04/22 0957  TempSrc:   PainSc: 0-No pain         Complications: No notable events documented.

## 2022-04-04 NOTE — Anesthesia Procedure Notes (Signed)
Procedure Name: Intubation Date/Time: 04/04/2022 10:42 AM  Performed by: Garrel Ridgel, CRNAPre-anesthesia Checklist: Patient identified, Emergency Drugs available, Suction available and Patient being monitored Patient Re-evaluated:Patient Re-evaluated prior to induction Oxygen Delivery Method: Circle system utilized Preoxygenation: Pre-oxygenation with 100% oxygen Induction Type: IV induction Ventilation: Mask ventilation without difficulty Laryngoscope Size: Mac Grade View: Grade II Tube type: Oral Tube size: 7.5 mm Number of attempts: 1 Airway Equipment and Method: Stylet Placement Confirmation: ETT inserted through vocal cords under direct vision, positive ETCO2 and breath sounds checked- equal and bilateral Secured at: 23 cm Tube secured with: Tape Dental Injury: Teeth and Oropharynx as per pre-operative assessment

## 2022-04-04 NOTE — Op Note (Signed)
04/04/2022  11:58 AM  PATIENT:  Jerry Allison  46 y.o. male  Patient Care Team: Derinda Late, MD as PCP - General (Family Medicine) [name], MD as Consulting Physician (Gastroenterology) [name], MD as Consulting Physician (Hematology) Nadeen Landau (Inactive) Kyung Rudd, MD as Consulting Physician (Radiation Oncology) Cheryll Cockayne, MD as Referring Physician (Surgical Oncology)  PRE-OPERATIVE DIAGNOSIS:  Ileostomy status, history of rectal cancer  POST-OPERATIVE DIAGNOSIS:  Same  PROCEDURE:  Takedown of diverting loop ileostomy  SURGEON:  Sharon Mt. Dema Severin, MD  ASSISTANT: Michael Boston MD  ANESTHESIA:   local and general  COUNTS:  Sponge, needle and instrument counts were reported correct x2 at the conclusion of the operation.  EBL: 150 mL  DRAINS: None  SPECIMEN: Ileostomy  COMPLICATIONS: None  FINDINGS: Diverting loop ileostomy with normal small bowel.  Ileostomy taken down in standard fashion uneventfully  DISPOSITION: PACU in satisfactory condition  DESCRIPTION: The patient was identified in preop holding and taken to the OR where he was placed on the operating room table. SCDs were placed. General endotracheal anesthesia was induced without difficulty. Pressure points were then evaluated and padded. He was then prepped and draped in the usual sterile fashion. A surgical timeout was performed indicating the correct patient, procedure, positioning and need for preoperative antibiotics.   A peristomal skin incision was created and the subcutaneous tissue divided electrocautery.  Both the proximal and distal limbs of the ileostomy were mobilized down to the level of the fascia.  This was all done sharply.  We are able to dissect the fascia free using Metzenbaum scissors from both the proximal and distal limbs.  After doing this, were able to enter the abdominal cavity without difficulty.  Both limbs are completely free.  They are both normal in  appearance.  The mesentery is quite oozy and therefore we opted to resect the ileostomy with a small amount of associated small bowel for hemostasis purposes.  Windows were created in the mesentery on both the proximal distal limbs and the respective limb of bowel was divided using GIA 75 mm blue load staplers.  The intervening mesentery was then ligated and divided using a hand-held LigaSure device.  There are a few areas of oozing on the mesenteric border that were oversewn using 3-0 silk suture.  Hemostasis is then verified.  The ileostomy was passed off the specimen.  Attention was directed to creating the enteroenterostomy. Orientation is confirmed such that there is no twisting of the small bowel and the planned anastomotic staple line will lie on the antimesenteric border of the bowel. Windows were created on the antimesenteric side of the staple line of both the proximal and distal limbs.  A 75 mm GIA blue load stapler was then used to create a ileoileal anastomosis in a side-to-side, functional end-to-end manner.  The staple line is inspected noted to be hemostatic and with well-formed staples.  The common enterotomy is then closed using Allis clamps and including both terminal ends of the staple line, the common enterotomy is excluded completely using a TA 90 blue load stapler.  The corners of the TA staple line are then dunked using 2-0 silk.  The staple line is inspected and noted to be hemostatic.  Both limbs are again well perfused in appearance and pink in color.  There is a palpable pulse in the mesentery going out to each respective divided side of the small bowel.  The mesenteric defect is then closed using 3-0 silk suture.  A  2-0 silk stitch is placed at the apex of the anastomosis.  The anastomosis is palpated noted to be 3 fingerbreadths and widely patent.  This is all returned into the abdominal cavity without difficulty.  The abdomen is inspected and hemostasis is appreciated.  Attention  was then directed at abdominal closure.  The rectus fascia is closed longitudinally this resultantly Simaan tension using 2 running #1 PDS sutures.  The fascial closure was palpated noted to be completely closed.  The wound was irrigated.  All sponge, needle, and instrument counts are reported correct.  The skin is then closed using a pursestring type stitch using 2-0 Vicryl suture.  The wound is then packed with a single moist 4 x 4 gauze.  A dressing consisting of 4 x 4's, ABD, and tape is placed.  He is then awakened from anesthesia, extubated, and transferred to a stretcher for transport to PACU in satisfactory condition.

## 2022-04-04 NOTE — Anesthesia Postprocedure Evaluation (Signed)
Anesthesia Post Note  Patient: Jerry Allison  Procedure(s) Performed: ILEOSTOMY TAKEDOWN     Patient location during evaluation: PACU Anesthesia Type: General Level of consciousness: sedated and patient cooperative Pain management: pain level controlled Vital Signs Assessment: post-procedure vital signs reviewed and stable Respiratory status: spontaneous breathing Cardiovascular status: stable Anesthetic complications: no   No notable events documented.  Last Vitals:  Vitals:   04/04/22 1612 04/04/22 1710  BP: 130/77 139/77  Pulse: 70 64  Resp: 18 18  Temp: 36.7 C (!) 36.2 C  SpO2: 96% 97%    Last Pain:  Vitals:   04/04/22 1710  TempSrc: Oral  PainSc:                  Nolon Nations

## 2022-04-04 NOTE — H&P (Signed)
CC: Here today for surgery  HPI: Jerry Allison is an 46 y.o. male whom underwent colonoscopy 03/07/2021 with Dr. [name] where he was found to have a malignant appearing partially obstructing tumor in the proximal rectum. Biopsied, tattooed. 3 mm polyp in the distal rectum removed. Remainder of the colon was normal. Tattoo was noted to be just proximal to the mass.  Pathology demonstrated adenocarcinoma. Polyp returned as a tubular adenoma.  CT chest/abdomen/pelvis 03/09/2021 showed asymmetrical thickening in the proximal/mid rectum. No evidence of metastatic disease.  He reports his symptoms preceding this were primarily that of some crampy lower abdominal pains for few months as well as hematochezia.  MRI 03/27/21 - "1. Low sigmoid primary, greater than 15 cm from the anal verge, as detailed above. Poorly delineated secondary to underdistention in this region. 2. No evidence of pelvic nodal metastasis. 3. Trace perisigmoid fluid, new since the prior CT."   [id number] stage II Dr. Ardis Hughs - favored rectal primary. CAPOX 05/10/21 initiated. Subsequent chemoXRT 10/09/21, finishing ~11/15/21. He denies any complaints today.      CT chest/abdomen/pelvis 11/14/2021 showed no evident progressive or metastatic disease Pelvic MRI 01/01/22 -response to therapy of low sigmoid/rectosigmoid junction primary. Focal area of under distention and mild wall thickening may all be related to treatment. No well-defined mass. No pelvic adenopathy.  Had second opinion at [facility] with Dr. Morton Stall. Cannot clearly see a mass per se on his proctoscopy. He therefore underwent an urgent flexible sigmoidoscopy with Dr. Havery Moros. He was found to have a severe stenosis with an inner diameter of 3 mm in the proximalmost aspect of the rectum. This was 15 cm from the anal verge and was not traversable. Biopsies were taken and did not demonstrate any evident cancerous type tissue. 4 mm polyp in the rectum. The dense  stricture was not traversable. Therefore, could not adequately evaluate the portion of rectum within the stricture or proximal to it.  We had spent time reviewing all this with him. We discussed that we cannot adequately survey the strictured portion or anything proximal to it. With this having an inner diameter also 3 mm, not being amenable to surveillance. We therefore had discussed proceeding with surgery and he was in agreement with this plan.  OR 01/12/2022- Robotic assisted low anterior resection with double stapled colorectal anastomosis with Diverting loop ileostomy Diagnostic flexible sigmoidoscopy to localize lesion Bilateral transversus abdominus plane (TAP) blocks  FINDINGS: No evident metastatic disease on visceral nor parietal peritoneum or liver surface. Tattoo in the proximal rectum and in the mid rectum. No obvious mass. Diagnostic flexible sigmoidoscopy was therefore carried out to localize the intraluminal location of the lesion. There is a relatively dense stricture between the upper valve of [name] in the mid valve of [name]. We are able to localize an approximate 5 cm distal margin and marked this endoscopically. Following this, a low anterior resection was carried out with a double stapled colorectal anastomosis. A well perfused, tension free, hemostatic, air tight 31 mm EEA colorectal anastomosis fashioned 8 cm from the anal verge by flexible sigmoidoscopy.  PATH:  A. COLON, RECTOSIGMOID, RESECTION:  - Fibrosis and mild chronic inflammation consistent with treatment  effect.  - No residual carcinoma (ypT0).  - All surgical margins negative for tumor.  - Eighteen lymph nodes negative for metastatic carcinoma (0/18)(ypN0).  - See oncology table.   B. DISTAL DONUT:  - Benign colon.  - Negative for malignancy.  Case was represented at multidisciplinary tumor board with plans  for ongoing surveillance moving forward.  He had recovered from his initial procedure and was  discharged home. In the interim, he has been readmitted twice with short-lived partial small bowel obstruction type picture. Our leading hypothesis at present is that it is likely related to a food bolus type issue as these typically resolve within 24 hours. He was recently discharged from the hospital earlier this week.   INTERVAL HX Flex sig 03/07/22 - clear. Doing great. Denies changes in health/health hx since we met last.  PMH: Denies  PSH: Denies  FHx: Denies any known family history of colorectal, breast, endometrial or ovarian cancer  Social Hx: Denies use of tobacco/illicit drug. Social EtOH use. He works in a [employer]. He is here today with his wife. He reports they do have children.  Past Medical History:  Diagnosis Date   Anxiety    Depression    Family history of bladder cancer    Rectal cancer ([place]) 02/2021    Past Surgical History:  Procedure Laterality Date   COLONOSCOPY     EUS N/A 04/27/2021   Procedure: LOWER ENDOSCOPIC ULTRASOUND (EUS);  Surgeon: [name], MD;  Location: [facility];  Service: Endoscopy;  Laterality: N/A;   FLEXIBLE SIGMOIDOSCOPY N/A 04/27/2021   Procedure: FLEXIBLE SIGMOIDOSCOPY;  Surgeon: [name], MD;  Location: [facility];  Service: Endoscopy;  Laterality: N/A;   FLEXIBLE SIGMOIDOSCOPY N/A 01/12/2022   Procedure: FLEXIBLE SIGMOIDOSCOPY;  Surgeon: Ileana Roup, MD;  Location: [facility];  Service: General;  Laterality: N/A;   FLEXIBLE SIGMOIDOSCOPY N/A 03/07/2022   Procedure: FLEXIBLE SIGMOIDOSCOPY;  Surgeon: Ileana Roup, MD;  Location: [facility];  Service: General;  Laterality: N/A;   ILEO LOOP DIVERSION N/A 01/12/2022   Procedure: DIVERTING LOOP ILEOSTOMY;  Surgeon: Ileana Roup, MD;  Location: [facility];  Service: General;  Laterality: N/A;   NO PAST SURGERIES     XI ROBOTIC ASSISTED LOWER ANTERIOR RESECTION N/A 01/12/2022   Procedure: XI ROBOTIC ASSISTED LOWER ANTERIOR RESECTION;  Surgeon: Ileana Roup, MD;  Location: [facility];  Service: General;  Laterality: N/A;    Family History  Problem Relation Age of Onset   Anxiety disorder Father    Healthy Brother    Bladder Cancer Maternal Grandmother 31   Healthy Half-Sister        paternal half   Colon cancer Neg Hx    Esophageal cancer Neg Hx    Stomach cancer Neg Hx    Pancreatic cancer Neg Hx    Liver disease Neg Hx    Inflammatory bowel disease Neg Hx     Social:  reports that he has never smoked. He has never used smokeless tobacco. He reports current alcohol use. He reports current drug use. Drug: Marijuana.  Allergies:  Allergies  Allergen Reactions   Shellfish Allergy Anaphylaxis    Medications: I have reviewed the patient's current medications.  No results found for this or any previous visit (from the past 48 hour(s)).  No results found.  PE Blood pressure (!) 132/90, pulse 71, temperature (!) 97.5 F (36.4 C), temperature source Oral, resp. rate 16, SpO2 100 %. Constitutional: NAD; conversant Eyes: Moist conjunctiva Lungs: Normal respiratory effort CV: RRR GI: Abd soft, NT/ND MSK: Normal range of motion of extremities Psychiatric: Appropriate affect; alert and oriented x3  No results found for this or any previous visit (from the past 48 hour(s)).  No results found.   A/P: Amarie Tarte is an 46 y.o. male  with no medical hx here for takedown of loop ileostomy in setting of robotic LAR/DLI following TNT for mid rectal cancer - 01/12/22  CEA 1.7 CT CAP 02/2021 - no evidence of metastatic dz MRI Pelvis 03/27/21 - 1. Low sigmoid primary, greater than 15 cm from the anal verge, as detailed above. Poorly delineated secondary to underdistention in this region. 2. No evidence of pelvic nodal metastasis. 3. Trace perisigmoid fluid, new since the prior CT.  Genetics negative  TNT - chemo completed; neoadjuvanct cXRT completion date 11/15/21  Flex sig completed by myself 03/07/22 showed healthy  patent colorectal anastomosis without adverse features/sinus/leak   -We spent time today reviewing the relevant anatomy as it pertains to his current anatomy.  We discussed loop ileostomy takedown  -The planned procedure, material risks (including, but not limited to, pain, bleeding, infection, scarring, need for blood transfusion, damage to surrounding structures- blood vessels/nerves/viscus/organs, damage to ureter, urine leak, leak from anastomosis, need for additional procedures, need for stoma which may be permanent, worsening of pre-existing medical conditions, hernia, recurrence, pneumonia, heart attack, stroke, death) benefits and alternatives to surgery were discussed at length. I noted a good probability that the procedure would help improve their symptoms. The patient's questions were answered to their satisfaction, they voiced understanding and elected to proceed with surgery. Additionally, we discussed typical postoperative expectations and the recovery process.  Nadeen Landau, [facility], [place]

## 2022-04-05 ENCOUNTER — Encounter (HOSPITAL_COMMUNITY): Payer: Self-pay | Admitting: Surgery

## 2022-04-05 LAB — BASIC METABOLIC PANEL
Anion gap: 7 (ref 5–15)
BUN: 10 mg/dL (ref 6–20)
CO2: 25 mmol/L (ref 22–32)
Calcium: 8.9 mg/dL (ref 8.9–10.3)
Chloride: 106 mmol/L (ref 98–111)
Creatinine, Ser: 0.98 mg/dL (ref 0.61–1.24)
GFR, Estimated: >60 mL/min (ref >60)
Glucose, Bld: 112 mg/dL — ABNORMAL HIGH (ref 70–99)
Potassium: 4.1 mmol/L (ref 3.5–5.1)
Sodium: 138 mmol/L (ref 135–145)

## 2022-04-05 LAB — CBC
HCT: 38.2 % — ABNORMAL LOW (ref 39.0–52.0)
Hemoglobin: 12.9 g/dL — ABNORMAL LOW (ref 13.0–17.0)
MCH: 29.7 pg (ref 26.0–34.0)
MCHC: 33.8 g/dL (ref 30.0–36.0)
MCV: 88 fL (ref 80.0–100.0)
Platelets: 241 10*3/uL (ref 150–400)
RBC: 4.34 MIL/uL (ref 4.22–5.81)
RDW: 12.9 % (ref 11.5–15.5)
WBC: 11.1 10*3/uL — ABNORMAL HIGH (ref 4.0–10.5)
nRBC: 0 % (ref 0.0–0.2)

## 2022-04-05 LAB — SURGICAL PATHOLOGY

## 2022-04-05 NOTE — TOC Progression Note (Signed)
Transition of Care [facility]) - Progression Note    Patient Details  Name: Jerry Allison MRN: [id number] Date of Birth: Jun 22, 1975  Transition [facility]) CM/SW Contact  Servando Snare, [place] Phone Number: 04/05/2022, 9:36 AM  Clinical Narrative:     Transition of Care [facility]) Screening Note   Patient Details  Name: Jerry Allison Date of Birth: May 26, 1976   Transition of Care [facility]) CM/SW Contact:    Servando Snare, LCSW Phone Number: 04/05/2022, 9:36 AM    Transition of Care Department [facility]) has reviewed patient and no TOC needs have been identified at this time. We will continue to monitor patient advancement through interdisciplinary progression rounds. If new patient transition needs arise, please place a TOC consult.          Expected Discharge Plan and Services                                                 Social Determinants of Health (SDOH) Interventions    Readmission Risk Interventions    01/15/2022   10:30 AM  Readmission Risk Prevention Plan  Post Dischage Appt Complete  Medication Screening Complete  Transportation Screening Complete

## 2022-04-05 NOTE — Progress Notes (Signed)
  Subjective No acute events. Doing well. Some nausea overnight; no emesis. Pain well controlled. Ambulating. Foley out and voiding. Tolerating clear liquids.   Objective: Vital signs in last 24 hours: Temp:  [97.2 F (36.2 C)-98.6 F (37 C)] 98.6 F (37 C) (11/09 0538) Pulse Rate:  [64-96] 68 (11/09 0538) Resp:  [8-18] 16 (11/09 0538) BP: (119-172)/(77-117) 120/88 (11/09 0538) SpO2:  [94 %-100 %] 98 % (11/09 0538) Weight:  [80.7 kg] 80.7 kg (11/08 0957)    Intake/Output from previous day: 11/08 0701 - 11/09 0700 In: 3345.5 [P.O.:810; I.V.:2435.5; IV Piggyback:100] Out: 1350 [Urine:800; Emesis/NG output:400; Blood:150] Intake/Output this shift: No intake/output data recorded.  Gen: NAD, comfortable CV: RRR Pulm: Normal work of breathing Abd: Soft, dressed/dry. Some incisional soreness about prior ostomy site but no tenderness laterally. No rebound/guarding. Nondistended. Ext: SCDs in place  Lab Results: CBC  Recent Labs    04/05/22 0425  WBC 11.1*  HGB 12.9*  HCT 38.2*  PLT 241   BMET Recent Labs    04/05/22 0425  NA 138  K 4.1  CL 106  CO2 25  GLUCOSE 112*  BUN 10  CREATININE 0.98  CALCIUM 8.9   PT/INR No results for input(s): "LABPROT", "INR" in the last 72 hours. ABG No results for input(s): "PHART", "HCO3" in the last 72 hours.  Invalid input(s): "PCO2", "PO2"  Studies/Results:  Anti-infectives: Anti-infectives (From admission, onward)    Start     Dose/Rate Route Frequency Ordered Stop   04/04/22 0945  cefoTEtan (CEFOTAN) 2 g in sodium chloride 0.9 % 100 mL IVPB        2 g 200 mL/hr over 30 Minutes Intravenous On call to O.R. 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 All questions were answered, he expressed understanding   LOS: 1 day   Nadeen Landau, MD [facility], [place]

## 2022-04-06 ENCOUNTER — Other Ambulatory Visit (HOSPITAL_COMMUNITY): Payer: Self-pay

## 2022-04-06 LAB — BASIC METABOLIC PANEL
Anion gap: 7 (ref 5–15)
BUN: 11 mg/dL (ref 6–20)
CO2: 28 mmol/L (ref 22–32)
Calcium: 8.9 mg/dL (ref 8.9–10.3)
Chloride: 103 mmol/L (ref 98–111)
Creatinine, Ser: 0.94 mg/dL (ref 0.61–1.24)
GFR, Estimated: >60 mL/min (ref >60)
Glucose, Bld: 108 mg/dL — ABNORMAL HIGH (ref 70–99)
Potassium: 3.8 mmol/L (ref 3.5–5.1)
Sodium: 138 mmol/L (ref 135–145)

## 2022-04-06 LAB — CBC
HCT: 38.6 % — ABNORMAL LOW (ref 39.0–52.0)
Hemoglobin: 12.8 g/dL — ABNORMAL LOW (ref 13.0–17.0)
MCH: 29.6 pg (ref 26.0–34.0)
MCHC: 33.2 g/dL (ref 30.0–36.0)
MCV: 89.4 fL (ref 80.0–100.0)
Platelets: 245 10*3/uL (ref 150–400)
RBC: 4.32 MIL/uL (ref 4.22–5.81)
RDW: 13.2 % (ref 11.5–15.5)
WBC: 8.9 10*3/uL (ref 4.0–10.5)
nRBC: 0 % (ref 0.0–0.2)

## 2022-04-06 MED ORDER — TRAMADOL HCL 50 MG PO TABS
50.0000 mg | ORAL_TABLET | Freq: Four times a day (QID) | ORAL | 0 refills | Status: DC | PRN
  Filled 2022-04-06: qty 15, 4d supply, fill #0

## 2022-04-06 NOTE — Progress Notes (Signed)
  Subjective No acute events. Doing well. Some nausea overnight; no emesis. Pain well controlled. Ambulating. Foley out and voiding. Tolerating clear liquids.   Objective: Vital signs in last 24 hours: Temp:  [97.6 F (36.4 C)-98.6 F (37 C)] 98 F (36.7 C) (11/10 0508) Pulse Rate:  [76-84] 81 (11/10 0508) Resp:  [17] 17 (11/10 0508) BP: (121-150)/(82-95) 136/95 (11/10 0508) SpO2:  [96 %-98 %] 96 % (11/10 0508) Last BM Date :  (" four months ago" per pt, pt recenet ileostomy surgery)  Intake/Output from previous day: 11/09 0701 - 11/10 0700 In: 1546.3 [P.O.:1320; I.V.:226.3] Out: 3100 [Urine:3100] Intake/Output this shift: No intake/output data recorded.  Gen: NAD, comfortable CV: RRR Pulm: Normal work of breathing Abd: Soft, dressed/dry. Some incisional soreness about prior ostomy site but no tenderness laterally. No rebound/guarding. Nondistended. Ext: SCDs in place  Lab Results: CBC  Recent Labs    04/05/22 0425 04/06/22 0407  WBC 11.1* 8.9  HGB 12.9* 12.8*  HCT 38.2* 38.6*  PLT 241 245   BMET Recent Labs    04/05/22 0425 04/06/22 0407  NA 138 138  K 4.1 3.8  CL 106 103  CO2 25 28  GLUCOSE 112* 108*  BUN 10 11  CREATININE 0.98 0.94  CALCIUM 8.9 8.9   PT/INR No results for input(s): "LABPROT", "INR" in the last 72 hours. ABG No results for input(s): "PHART", "HCO3" in the last 72 hours.  Invalid input(s): "PCO2", "PO2"  Studies/Results:  Anti-infectives: Anti-infectives (From admission, onward)    Start     Dose/Rate Route Frequency Ordered Stop   04/04/22 0945  cefoTEtan (CEFOTAN) 2 g in sodium chloride 0.9 % 100 mL IVPB        2 g 200 mL/hr over 30 Minutes Intravenous On call to O.R. 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 All questions answered   LOS: 2 days   Nadeen Landau, MD [facility], [place]

## 2022-04-06 NOTE — Discharge Instructions (Signed)
POST OP INSTRUCTIONS AFTER COLON SURGERY  DIET: Be sure to include lots of fluids daily to stay hydrated - 64oz of water per day (8, 8 oz glasses).  Avoid fast food or heavy meals for the first couple of weeks as your are more likely to get nauseated. Avoid raw/uncooked fruits or vegetables for the first 4 weeks (its ok to have these if they are blended into smoothie form). If you have fruits/vegetables, make sure they are cooked until soft enough to mash on the roof of your mouth and chew your food well. Otherwise, diet as tolerated.  Take your usually prescribed home medications unless otherwise directed.  PAIN CONTROL: Pain is best controlled by a usual combination of three different methods TOGETHER: Ice/Heat Over the counter pain medication Prescription pain medication Most patients will experience some swelling and bruising around the surgical site.  Ice packs or heating pads (30-60 minutes up to 6 times a day) will help. Some people prefer to use ice alone, heat alone, alternating between ice & heat.  Experiment to what works for you.  Swelling and bruising can take several weeks to resolve.   It is helpful to take an over-the-counter pain medication regularly for the first few weeks: Ibuprofen (Motrin/Advil) - '200mg'$  tabs - take 3 tabs ('600mg'$ ) every 6 hours as needed for pain (unless you have been directed previously to avoid NSAIDs/ibuprofen) Acetaminophen (Tylenol) - you may take '650mg'$  every 6 hours as needed. You can take this with motrin as they act differently on the body. If you are taking a narcotic pain medication that has acetaminophen in it, do not take over the counter tylenol at the same time. NOTE: You may take both of these medications together - most patients find it most helpful when alternating between the two (i.e. Ibuprofen at 6am, tylenol at 9am, ibuprofen at 12pm ..Marland Kitchen) A  prescription for pain medication should be given to you upon discharge.  Take your pain medication as  prescribed if your pain is not adequatly controlled with the over-the-counter pain reliefs mentioned above.   Dressing: Place a dry clean gauze over the ostomy takedown site and change daily. Ok to shower with soap/water running through the former ostomy site. Avoid baths/pools/lakes/oceans until your wounds have fully healed.  ACTIVITIES as tolerated:   Avoid heavy lifting (>10lbs or 1 gallon of milk) for the next 6 weeks. You may resume regular daily activities as tolerated--such as daily self-care, walking, climbing stairs--gradually increasing activities as tolerated.  If you can walk 30 minutes without difficulty, it is safe to try more intense activity such as jogging, treadmill, bicycling, low-impact aerobics.  DO NOT PUSH THROUGH PAIN.  Let pain be your guide: If it hurts to do something, don't do it. You may drive when you are no longer taking prescription pain medication, you can comfortably wear a seatbelt, and you can safely maneuver your car and apply brakes.  FOLLOW UP in our office Please call [facility] at [phone] to set up an appointment to see your surgeon in the office for a follow-up appointment approximately 2 weeks after your surgery. Make sure that you call for this appointment the day you arrive home to insure a convenient appointment time.  9. If you have disability or family leave forms that need to be completed, you may have them completed by your primary care physician's office; for return to work instructions, please ask our office staff and they will be happy to assist you in obtaining this  documentation   When to call us [phone]: Poor pain control Reactions / problems with new medications (rash/itching, etc)  Fever over 101.5 F (38.5 C) Inability to urinate Nausea/vomiting Worsening swelling or bruising Continued bleeding from incision. Increased pain, redness, or drainage from the incision  The clinic staff is available to answer your questions during  regular business hours (8:30am-5pm).  Please don't hesitate to call and ask to speak to one of our nurses for clinical concerns.   A surgeon from [facility] is always on call at the hospitals   If you have a medical emergency, go to the nearest emergency room or call 911.  [facility], [place] [address], [place], [place], [place]  [address] MAIN: [phone] FAX: [phone] [email or web address]

## 2022-04-07 LAB — CBC
HCT: 38.6 % — ABNORMAL LOW (ref 39.0–52.0)
Hemoglobin: 12.7 g/dL — ABNORMAL LOW (ref 13.0–17.0)
MCH: 29.1 pg (ref 26.0–34.0)
MCHC: 32.9 g/dL (ref 30.0–36.0)
MCV: 88.3 fL (ref 80.0–100.0)
Platelets: 266 10*3/uL (ref 150–400)
RBC: 4.37 MIL/uL (ref 4.22–5.81)
RDW: 13.1 % (ref 11.5–15.5)
WBC: 8.3 10*3/uL (ref 4.0–10.5)
nRBC: 0 % (ref 0.0–0.2)

## 2022-04-07 LAB — BASIC METABOLIC PANEL
Anion gap: 4 — ABNORMAL LOW (ref 5–15)
BUN: 12 mg/dL (ref 6–20)
CO2: 28 mmol/L (ref 22–32)
Calcium: 8.8 mg/dL — ABNORMAL LOW (ref 8.9–10.3)
Chloride: 102 mmol/L (ref 98–111)
Creatinine, Ser: 0.85 mg/dL (ref 0.61–1.24)
GFR, Estimated: >60 mL/min (ref >60)
Glucose, Bld: 98 mg/dL (ref 70–99)
Potassium: 3.9 mmol/L (ref 3.5–5.1)
Sodium: 134 mmol/L — ABNORMAL LOW (ref 135–145)

## 2022-04-07 NOTE — Progress Notes (Signed)
Pharmacy Brief Note - Alvimopan (Entereg)  The standing order set for alvimopan (Entereg) now includes an automatic order to discontinue the drug after the patient has had a bowel movement. The change was approved by the [place] and the Medical Executive Committee.   This patient has had bowel movements documented by nursing. Therefore, alvimopan has been discontinued. If there are questions, please contact the pharmacy at [phone].   Thank you- Peggyann Juba, PharmD, [facility]: [phone]

## 2022-04-07 NOTE — Progress Notes (Signed)
3 Days Post-Op   Subjective/Chief Complaint: Patient quite sore bowel function returning with complaints   Objective: Vital signs in last 24 hours: Temp:  [98 F (36.7 C)-98.3 F (36.8 C)] 98 F (36.7 C) (11/11 0513) Pulse Rate:  [76-85] 84 (11/11 0513) Resp:  [12-18] 12 (11/11 0513) BP: (138-141)/(92-98) 139/98 (11/11 0513) SpO2:  [97 %-98 %] 97 % (11/11 0513) Weight:  [80 kg] 80 kg (11/11 0500) Last BM Date :  (" four months ago" per pt, pt recenet ileostomy surgery)  Intake/Output from previous day: 11/10 0701 - 11/11 0700 In: 480 [P.O.:480] Out: 0  Intake/Output this shift: No intake/output data recorded.  General appearance: alert and cooperative Resp: clear to auscultation bilaterally Cardio: Normal sinus rhythm Incision/Wound: Ostomy wound site clean.  Abdomen soft.  No rebound guarding.  Lab Results:  Recent Labs    04/06/22 0407 04/07/22 0532  WBC 8.9 8.3  HGB 12.8* 12.7*  HCT 38.6* 38.6*  PLT 245 266   BMET Recent Labs    04/06/22 0407 04/07/22 0532  NA 138 134*  K 3.8 3.9  CL 103 102  CO2 28 28  GLUCOSE 108* 98  BUN 11 12  CREATININE 0.94 0.85  CALCIUM 8.9 8.8*   PT/INR No results for input(s): "LABPROT", "INR" in the last 72 hours. ABG No results for input(s): "PHART", "HCO3" in the last 72 hours.  Invalid input(s): "PCO2", "PO2"  Studies/Results: No results found.  Anti-infectives: Anti-infectives (From admission, onward)    Start     Dose/Rate Route Frequency Ordered Stop   04/04/22 0945  cefoTEtan (CEFOTAN) 2 g in sodium chloride 0.9 % 100 mL IVPB        2 g 200 mL/hr over 30 Minutes Intravenous On call to O.R. 04/04/22 0938 04/04/22 1058       Assessment/Plan: s/p Procedure(s): ILEOSTOMY TAKEDOWN (N/A) Overall doing well  Plan for discharge tomorrow if he tolerates his diet today, has pain control can ambulate.  LOS: 3 days    Turner Daniels MD 04/07/2022

## 2022-04-08 NOTE — Progress Notes (Signed)
Pt has DC order. AVS was given and explained to pt and family. Dressing supplies were given to pt, dressing change daily. Informed that pharmacy is close today, but pt was agreeable of getting it tomorrow, verbalizing he has supplies at home of Tramadol from his last surgery. Wife will drive pt home.

## 2022-04-08 NOTE — Discharge Summary (Signed)
Physician Discharge Summary  Patient ID: Jerry Allison MRN: [id number] DOB/AGE: August 02, 1975 46 y.o.  Admit date: 04/04/2022 Discharge date: 04/08/2022  Admission Diagnoses: Diverting loop ileostomy  Discharge Diagnoses:  Principal Problem:   S/P closure of ileostomy   Discharged Condition: good  Hospital Course: Patient underwent takedown of loop ileostomy that was diverting.  Postoperatively, he did well.  His bowel function returned.  His bowel movements became more normal by postop day 3.  He had good pain control was tolerating his diet.  He had no distention.  His operative site remained clean dry and intact he was discharged home on postop day 4 in satisfactory condition.      Treatments: surgery: Loop ileostomy closure  Discharge Exam: Blood pressure (!) 137/94, pulse 75, temperature 98.1 F (36.7 C), temperature source Oral, resp. rate 14, height 6' (1.829 m), weight 77.5 kg, SpO2 98 %. General appearance: alert and cooperative Resp: clear to auscultation bilaterally Cardio: Normal sinus rhythm Incision/Wound: Operative site right lower quadrant clean dry intact.  It is opened and covered with a dry dressing.  This was taken down today.  Abdomen soft nontender without rebound or guarding.  Disposition: Discharge disposition: 01-Home or Self Care       Discharge Instructions     Diet - low sodium heart healthy   Complete by: As directed    Increase activity slowly   Complete by: As directed       Allergies as of 04/08/2022       Reactions   Shellfish Allergy Anaphylaxis        Medication List     TAKE these medications    acetaminophen 500 MG tablet Commonly known as: TYLENOL Take 1,000 mg by mouth every 6 (six) hours as needed (pain.).   Ensure Take 237 mLs by mouth daily.   escitalopram 10 MG tablet Commonly known as: LEXAPRO Take 5 mg by mouth at bedtime.   gabapentin 100 MG capsule Commonly known as: Neurontin Take 1 capsule (100 mg  total) by mouth at bedtime. Ok to increase t0 3 capsules at night if needed and tolerates well What changed:  how much to take when to take this reasons to take this additional instructions   ibuprofen 200 MG tablet Commonly known as: ADVIL Take 400 mg by mouth daily as needed (pain.).   Nasacort Allergy 24HR 55 MCG/ACT Aero nasal inhaler Generic drug: triamcinolone Place 1 spray into the nose daily as needed (allergies).   omeprazole 20 MG capsule Commonly known as: PRILOSEC Take 1 capsule (20 mg total) by mouth daily. What changed:  when to take this reasons to take this   oxymetazoline 0.05 % nasal spray Commonly known as: AFRIN Place 1 spray into both nostrils daily as needed for congestion.   traMADol 50 MG tablet Commonly known as: Ultram Take 1 tablet (50 mg total) by mouth every 6 (six) hours as needed for up to 5 days (postop pain not controlled with tylenol/ibuprofen first).        Follow-up Information     Ileana Roup, MD Follow up on 04/27/2022.   Specialties: General Surgery, Colon and Rectal Surgery Contact information: [place] [address] [phone]                 Signed: Joyice Faster Sayuri Rhames MD 04/08/2022, 10:48 AM

## 2022-04-10 ENCOUNTER — Other Ambulatory Visit (HOSPITAL_COMMUNITY): Payer: Self-pay

## 2022-04-11 ENCOUNTER — Encounter (HOSPITAL_COMMUNITY): Payer: Self-pay | Admitting: Nurse Practitioner

## 2022-04-13 ENCOUNTER — Emergency Department (HOSPITAL_COMMUNITY): Payer: BC Managed Care – PPO

## 2022-04-13 ENCOUNTER — Other Ambulatory Visit: Payer: Self-pay

## 2022-04-13 ENCOUNTER — Inpatient Hospital Stay (HOSPITAL_COMMUNITY)
Admission: EM | Admit: 2022-04-13 | Discharge: 2022-04-15 | DRG: 921 | Disposition: A | Discharge status: Home / Self Care | Payer: BC Managed Care – PPO | Attending: Surgery | Admitting: Surgery

## 2022-04-13 DIAGNOSIS — Z91013 Allergy to seafood: Secondary | ICD-10-CM

## 2022-04-13 DIAGNOSIS — Y838 Other surgical procedures as the cause of abnormal reaction of the patient, or of later complication, without mention of misadventure at the time of the procedure: Secondary | ICD-10-CM | POA: Diagnosis present

## 2022-04-13 DIAGNOSIS — Z79899 Other long term (current) drug therapy: Secondary | ICD-10-CM

## 2022-04-13 DIAGNOSIS — Z85048 Personal history of other malignant neoplasm of rectum, rectosigmoid junction, and anus: Secondary | ICD-10-CM

## 2022-04-13 DIAGNOSIS — Z9049 Acquired absence of other specified parts of digestive tract: Secondary | ICD-10-CM | POA: Diagnosis not present

## 2022-04-13 DIAGNOSIS — F419 Anxiety disorder, unspecified: Secondary | ICD-10-CM | POA: Diagnosis present

## 2022-04-13 DIAGNOSIS — L7632 Postprocedural hematoma of skin and subcutaneous tissue following other procedure: Principal | ICD-10-CM | POA: Diagnosis present

## 2022-04-13 DIAGNOSIS — F32A Depression, unspecified: Secondary | ICD-10-CM | POA: Diagnosis present

## 2022-04-13 DIAGNOSIS — R1030 Lower abdominal pain, unspecified: Principal | ICD-10-CM

## 2022-04-13 DIAGNOSIS — G8918 Other acute postprocedural pain: Secondary | ICD-10-CM | POA: Diagnosis present

## 2022-04-13 DIAGNOSIS — Z818 Family history of other mental and behavioral disorders: Secondary | ICD-10-CM

## 2022-04-13 LAB — CBC WITH DIFFERENTIAL/PLATELET
Abs Immature Granulocytes: 0.07 10*3/uL (ref 0.00–0.07)
Basophils Absolute: 0 10*3/uL (ref 0.0–0.1)
Basophils Relative: 0 %
Eosinophils Absolute: 0.5 10*3/uL (ref 0.0–0.5)
Eosinophils Relative: 5 %
HCT: 43.4 % (ref 39.0–52.0)
Hemoglobin: 14.6 g/dL (ref 13.0–17.0)
Immature Granulocytes: 1 %
Lymphocytes Relative: 10 %
Lymphs Abs: 1.1 10*3/uL (ref 0.7–4.0)
MCH: 29.7 pg (ref 26.0–34.0)
MCHC: 33.6 g/dL (ref 30.0–36.0)
MCV: 88.2 fL (ref 80.0–100.0)
Monocytes Absolute: 1.1 10*3/uL — ABNORMAL HIGH (ref 0.1–1.0)
Monocytes Relative: 10 %
Neutro Abs: 8 10*3/uL — ABNORMAL HIGH (ref 1.7–7.7)
Neutrophils Relative %: 74 %
Platelets: 366 10*3/uL (ref 150–400)
RBC: 4.92 MIL/uL (ref 4.22–5.81)
RDW: 13.5 % (ref 11.5–15.5)
WBC: 10.8 10*3/uL — ABNORMAL HIGH (ref 4.0–10.5)
nRBC: 0 % (ref 0.0–0.2)

## 2022-04-13 LAB — COMPREHENSIVE METABOLIC PANEL
ALT: 43 U/L (ref 0–44)
AST: 25 U/L (ref 15–41)
Albumin: 3.5 g/dL (ref 3.5–5.0)
Alkaline Phosphatase: 95 U/L (ref 38–126)
Anion gap: 5 (ref 5–15)
BUN: 16 mg/dL (ref 6–20)
CO2: 25 mmol/L (ref 22–32)
Calcium: 8.3 mg/dL — ABNORMAL LOW (ref 8.9–10.3)
Chloride: 105 mmol/L (ref 98–111)
Creatinine, Ser: 0.93 mg/dL (ref 0.61–1.24)
GFR, Estimated: >60 mL/min (ref >60)
Glucose, Bld: 100 mg/dL — ABNORMAL HIGH (ref 70–99)
Potassium: 3.8 mmol/L (ref 3.5–5.1)
Sodium: 135 mmol/L (ref 135–145)
Total Bilirubin: 0.6 mg/dL (ref 0.3–1.2)
Total Protein: 6.5 g/dL (ref 6.5–8.1)

## 2022-04-13 LAB — URINALYSIS, ROUTINE W REFLEX MICROSCOPIC
Bilirubin Urine: NEGATIVE
Glucose, UA: NEGATIVE mg/dL
Hgb urine dipstick: NEGATIVE
Ketones, ur: NEGATIVE mg/dL
Leukocytes,Ua: NEGATIVE
Nitrite: NEGATIVE
Protein, ur: NEGATIVE mg/dL
Specific Gravity, Urine: >1.046 — ABNORMAL HIGH (ref 1.005–1.030)
pH: 5 (ref 5.0–8.0)

## 2022-04-13 MED ORDER — ONDANSETRON HCL 4 MG/2ML IJ SOLN
4.0000 mg | Freq: Four times a day (QID) | INTRAMUSCULAR | Status: DC | PRN
  Administered 2022-04-13 – 2022-04-14 (×3): 4 mg via INTRAVENOUS
  Filled 2022-04-13 (×3): qty 2

## 2022-04-13 MED ORDER — POTASSIUM CHLORIDE IN NACL 20-0.9 MEQ/L-% IV SOLN
INTRAVENOUS | Status: DC
  Filled 2022-04-13 (×9): qty 1000

## 2022-04-13 MED ORDER — HYDROMORPHONE HCL 1 MG/ML IJ SOLN
1.0000 mg | Freq: Once | INTRAMUSCULAR | Status: AC
  Administered 2022-04-13: 1 mg via INTRAVENOUS
  Filled 2022-04-13: qty 1

## 2022-04-13 MED ORDER — LORAZEPAM 2 MG/ML IJ SOLN
0.5000 mg | Freq: Once | INTRAMUSCULAR | Status: AC
  Administered 2022-04-13: 0.5 mg via INTRAVENOUS
  Filled 2022-04-13: qty 1

## 2022-04-13 MED ORDER — ONDANSETRON HCL 4 MG/2ML IJ SOLN
4.0000 mg | Freq: Once | INTRAMUSCULAR | Status: AC
  Administered 2022-04-13: 4 mg via INTRAVENOUS
  Filled 2022-04-13: qty 2

## 2022-04-13 MED ORDER — HYDROMORPHONE HCL 1 MG/ML IJ SOLN
1.0000 mg | INTRAMUSCULAR | Status: DC | PRN
  Administered 2022-04-13 – 2022-04-14 (×7): 1 mg via INTRAVENOUS
  Filled 2022-04-13 (×7): qty 1

## 2022-04-13 MED ORDER — SODIUM CHLORIDE (PF) 0.9 % IJ SOLN
INTRAMUSCULAR | Status: AC
  Filled 2022-04-13: qty 50

## 2022-04-13 MED ORDER — IOHEXOL 300 MG/ML  SOLN
100.0000 mL | Freq: Once | INTRAMUSCULAR | Status: AC | PRN
  Administered 2022-04-13: 100 mL via INTRAVENOUS

## 2022-04-13 MED ORDER — ONDANSETRON 4 MG PO TBDP: 4.0000 mg | ORAL_TABLET | Freq: Four times a day (QID) | ORAL | Status: DC | PRN

## 2022-04-13 MED ORDER — SODIUM CHLORIDE 0.9 % IV BOLUS
1000.0000 mL | Freq: Once | INTRAVENOUS | Status: AC
  Administered 2022-04-13: 1000 mL via INTRAVENOUS

## 2022-04-13 NOTE — ED Triage Notes (Signed)
Woke up with onset of rt lower abd pain, surgery Nov 8th of ostomy reversal.

## 2022-04-13 NOTE — H&P (Signed)
Jerry Allison is an 46 y.o. male.   Chief Complaint: post op abdominal pain HPI: This is a pleasant 46 year old gentleman who is status post takedown of a loop ileostomy last week on November 8 by Dr. Dema Severin.  He was discharged doing well on November 12.  He had been doing well and has had a few bowel movements until this morning when he had the sudden onset of right lower quadrant sharp abdominal pain.  Pain was described as moderate to severe.  He has been having no fevers or chills.  Past Medical History:  Diagnosis Date   Anxiety    Depression    Family history of bladder cancer    Rectal cancer ([place]) 02/2021    Past Surgical History:  Procedure Laterality Date   COLONOSCOPY     EUS N/A 04/27/2021   Procedure: LOWER ENDOSCOPIC ULTRASOUND (EUS);  Surgeon: [name], MD;  Location: [facility];  Service: Endoscopy;  Laterality: N/A;   FLEXIBLE SIGMOIDOSCOPY N/A 04/27/2021   Procedure: FLEXIBLE SIGMOIDOSCOPY;  Surgeon: [name], MD;  Location: [facility];  Service: Endoscopy;  Laterality: N/A;   FLEXIBLE SIGMOIDOSCOPY N/A 01/12/2022   Procedure: FLEXIBLE SIGMOIDOSCOPY;  Surgeon: Ileana Roup, MD;  Location: [facility];  Service: General;  Laterality: N/A;   FLEXIBLE SIGMOIDOSCOPY N/A 03/07/2022   Procedure: FLEXIBLE SIGMOIDOSCOPY;  Surgeon: Ileana Roup, MD;  Location: [facility];  Service: General;  Laterality: N/A;   ILEO LOOP DIVERSION N/A 01/12/2022   Procedure: DIVERTING LOOP ILEOSTOMY;  Surgeon: Ileana Roup, MD;  Location: [facility];  Service: General;  Laterality: N/A;   ILEOSTOMY CLOSURE N/A 04/04/2022   Procedure: ILEOSTOMY TAKEDOWN;  Surgeon: Ileana Roup, MD;  Location: [facility];  Service: General;  Laterality: N/A;   NO PAST SURGERIES     XI ROBOTIC ASSISTED LOWER ANTERIOR RESECTION N/A 01/12/2022   Procedure: XI ROBOTIC ASSISTED LOWER ANTERIOR RESECTION;  Surgeon: Ileana Roup, MD;  Location: [facility];  Service: General;   Laterality: N/A;    Family History  Problem Relation Age of Onset   Anxiety disorder Father    Healthy Brother    Bladder Cancer Maternal Grandmother 15   Healthy Half-Sister        paternal half   Colon cancer Neg Hx    Esophageal cancer Neg Hx    Stomach cancer Neg Hx    Pancreatic cancer Neg Hx    Liver disease Neg Hx    Inflammatory bowel disease Neg Hx    Social History:  reports that he has never smoked. He has never used smokeless tobacco. He reports current alcohol use. He reports current drug use. Drug: Marijuana.  Allergies:  Allergies  Allergen Reactions   Shellfish Allergy Anaphylaxis    (Not in a hospital admission)   Results for orders placed or performed during the hospital encounter of 04/13/22 (from the past 48 hour(s))  CBC with Differential     Status: Abnormal   Collection Time: 04/13/22 10:49 AM  Result Value Ref Range   WBC 10.8 (H) 4.0 - 10.5 K/uL   RBC 4.92 4.22 - 5.81 MIL/uL   Hemoglobin 14.6 13.0 - 17.0 g/dL   HCT 43.4 39.0 - 52.0 %   MCV 88.2 80.0 - 100.0 fL   MCH 29.7 26.0 - 34.0 pg   MCHC 33.6 30.0 - 36.0 g/dL   RDW 13.5 11.5 - 15.5 %   Platelets 366 150 - 400 K/uL   nRBC 0.0 0.0 -  0.2 %   Neutrophils Relative % 74 %   Neutro Abs 8.0 (H) 1.7 - 7.7 K/uL   Lymphocytes Relative 10 %   Lymphs Abs 1.1 0.7 - 4.0 K/uL   Monocytes Relative 10 %   Monocytes Absolute 1.1 (H) 0.1 - 1.0 K/uL   Eosinophils Relative 5 %   Eosinophils Absolute 0.5 0.0 - 0.5 K/uL   Basophils Relative 0 %   Basophils Absolute 0.0 0.0 - 0.1 K/uL   Immature Granulocytes 1 %   Abs Immature Granulocytes 0.07 0.00 - 0.07 K/uL    Comment: Performed at [facility], [place] [address] [place], [place] [address]   CT ABDOMEN PELVIS W CONTRAST  Result Date: 04/13/2022 CLINICAL DATA:  Abdominal pain status post ileostomy reversal November 8. History of partial colectomy for rectosigmoid cancer. EXAM: CT ABDOMEN AND PELVIS WITH CONTRAST TECHNIQUE:  Multidetector CT imaging of the abdomen and pelvis was performed using the standard protocol following bolus administration of intravenous contrast. RADIATION DOSE REDUCTION: This exam was performed according to the departmental dose-optimization program which includes automated exposure control, adjustment of the mA and/or kV according to patient size and/or use of iterative reconstruction technique. CONTRAST:  169m OMNIPAQUE IOHEXOL 300 MG/ML  SOLN COMPARISON:  Multiple priors including most recent CT January 30, 2022. FINDINGS: Lower chest: No acute abnormality. Hepatobiliary: No suspicious hepatic lesion. Gallbladder is unremarkable. No biliary ductal dilation. Pancreas: No pancreatic ductal dilation or evidence of acute inflammation. Spleen: No splenomegaly. Adrenals/Urinary Tract: Bilateral adrenal glands appear normal. No hydronephrosis. Fluid density 2.8 cm left upper pole renal cyst is compatible with a benign finding requires no imaging follow-up. Urinary bladder is unremarkable for degree of distension. Stomach/Bowel: Postsurgical change of prior partial colectomy with recent ileostomy reversal. Rectosigmoid anastomotic suture lines appear normal. Along the ileostomy surgical lines there is a heterogeneous collection measuring 5.2 x 2.3 cm on image 52/2. Adjacent to this is some stranding. Stomach is unremarkable for degree of distension. No pathologic dilation of small or large bowel. Moderate volume of stool in the ascending colon. Vascular/Lymphatic: Normal caliber abdominal aorta. No pathologically enlarged abdominal or pelvic lymph nodes. Reproductive: Prostate is unremarkable. Other: Small volume pneumoperitoneum predominantly in the right upper quadrant with a few other foci of gas interspersed throughout the anterior abdomen. Small volume pelvic free fluid. Non approximation of the right anterior abdominal wall ostomy site. Mild peritoneal thickening in the right anterior abdominal wall  subjacent to the ostomy site. Musculoskeletal: No acute osseous abnormality. IMPRESSION: 1. Postsurgical change of prior partial colectomy with recent ileostomy reversal, with a heterogeneous 5.2 x 2.3 cm fluid collection along the ileostomy surgical lines likely reflecting a postoperative hematoma. 2. Small volume pneumoperitoneum and small volume pelvic free fluid, which is favored postsurgical. Although, an enteric/anastomotic suture leak is not entirely excluded. If clinical concern suggest more definitive characterization with repeat CT abdomen and pelvis utilizing oral contrast medium. 3. Non approximation of the right anterior abdominal wall ostomy site with mild peritoneal thickening in the right anterior abdominal wall subjacent to the ostomy site, which may reflect postsurgical change. 4. Moderate volume of stool in the ascending colon. Electronically Signed   By: JDahlia BailiffM.D.   On: 04/13/2022 11:40    Review of Systems  All other systems reviewed and are negative.   Blood pressure (!) 139/100, pulse 88, temperature 98.4 F (36.9 C), resp. rate 18, SpO2 97 %. Physical Exam Constitutional:      Comments: He is awake  and alert but does appear mildly uncomfortable.  He is not diaphoretic  Eyes:     General: No scleral icterus. Cardiovascular:     Rate and Rhythm: Normal rate and regular rhythm.  Pulmonary:     Effort: Pulmonary effort is normal.     Breath sounds: Normal breath sounds.  Abdominal:     Comments: His abdomen is soft and flat.  There is some tenderness focally with guarding at the area of the ostomy in the lower quadrant.  The site is open and there is some old blood at the base of the ostomy site.  There is no odor or feculent drainage  Skin:    General: Skin is warm and dry.  Neurological:     General: No focal deficit present.  Psychiatric:        Behavior: Behavior normal.      Assessment/Plan Postoperative intra-abdominal hematoma with abdominal  pain  I have reviewed his CT scan and have discussed this with Dr. Dema Severin who is reviewed the scan also.  Based on the sudden onset of the symptoms and the lack of air in this collection, I do suspect this is a hematoma and not an abscess.  His white blood count is is minimally elevated as is his hemoglobin which I suspect is from dehydration.  Currently, he does not have an acute abdomen so I do not believe he needs emergent surgical exploration.  We will also hold on any attempted IR drainage of this collection for fear of creating a fistula.  He will be admitted for wound care and placed on bowel rest.  If his condition acutely worsens, he still may require either drainage of collection or surgery.  I discussed this with the patient and his wife and again with Dr. Dema Severin and they agree with the plans.  Coralie Keens, MD 04/13/2022, 12:41 PM

## 2022-04-14 ENCOUNTER — Encounter (HOSPITAL_COMMUNITY): Payer: Self-pay | Admitting: Surgery

## 2022-04-14 DIAGNOSIS — Y838 Other surgical procedures as the cause of abnormal reaction of the patient, or of later complication, without mention of misadventure at the time of the procedure: Secondary | ICD-10-CM | POA: Diagnosis present

## 2022-04-14 DIAGNOSIS — F419 Anxiety disorder, unspecified: Secondary | ICD-10-CM | POA: Diagnosis present

## 2022-04-14 DIAGNOSIS — Z91013 Allergy to seafood: Secondary | ICD-10-CM | POA: Diagnosis not present

## 2022-04-14 DIAGNOSIS — Z79899 Other long term (current) drug therapy: Secondary | ICD-10-CM | POA: Diagnosis not present

## 2022-04-14 DIAGNOSIS — G8918 Other acute postprocedural pain: Secondary | ICD-10-CM | POA: Diagnosis present

## 2022-04-14 DIAGNOSIS — L7632 Postprocedural hematoma of skin and subcutaneous tissue following other procedure: Secondary | ICD-10-CM | POA: Diagnosis present

## 2022-04-14 DIAGNOSIS — Z85048 Personal history of other malignant neoplasm of rectum, rectosigmoid junction, and anus: Secondary | ICD-10-CM | POA: Diagnosis not present

## 2022-04-14 DIAGNOSIS — Z9049 Acquired absence of other specified parts of digestive tract: Secondary | ICD-10-CM | POA: Diagnosis present

## 2022-04-14 DIAGNOSIS — F32A Depression, unspecified: Secondary | ICD-10-CM | POA: Diagnosis present

## 2022-04-14 DIAGNOSIS — Z818 Family history of other mental and behavioral disorders: Secondary | ICD-10-CM | POA: Diagnosis not present

## 2022-04-14 LAB — BASIC METABOLIC PANEL
Anion gap: 4 — ABNORMAL LOW (ref 5–15)
BUN: 11 mg/dL (ref 6–20)
CO2: 26 mmol/L (ref 22–32)
Calcium: 8.7 mg/dL — ABNORMAL LOW (ref 8.9–10.3)
Chloride: 107 mmol/L (ref 98–111)
Creatinine, Ser: 0.83 mg/dL (ref 0.61–1.24)
GFR, Estimated: >60 mL/min (ref >60)
Glucose, Bld: 90 mg/dL (ref 70–99)
Potassium: 4.3 mmol/L (ref 3.5–5.1)
Sodium: 137 mmol/L (ref 135–145)

## 2022-04-14 LAB — CBC
HCT: 38.6 % — ABNORMAL LOW (ref 39.0–52.0)
Hemoglobin: 12.7 g/dL — ABNORMAL LOW (ref 13.0–17.0)
MCH: 29.9 pg (ref 26.0–34.0)
MCHC: 32.9 g/dL (ref 30.0–36.0)
MCV: 90.8 fL (ref 80.0–100.0)
Platelets: 338 10*3/uL (ref 150–400)
RBC: 4.25 MIL/uL (ref 4.22–5.81)
RDW: 13.6 % (ref 11.5–15.5)
WBC: 6.7 10*3/uL (ref 4.0–10.5)
nRBC: 0 % (ref 0.0–0.2)

## 2022-04-14 MED ORDER — LORAZEPAM 2 MG/ML IJ SOLN: 1.0000 mg | INTRAMUSCULAR | Status: DC | PRN

## 2022-04-14 MED ORDER — OXYCODONE HCL 5 MG PO TABS: 5.0000 mg | ORAL_TABLET | ORAL | Status: DC | PRN

## 2022-04-14 MED ORDER — THIAMINE HCL 100 MG/ML IJ SOLN: 100.0000 mg | Freq: Every day | INTRAMUSCULAR | Status: DC

## 2022-04-14 MED ORDER — ADULT MULTIVITAMIN W/MINERALS CH
1.0000 | ORAL_TABLET | Freq: Every day | ORAL | Status: DC
  Administered 2022-04-14: 1 via ORAL
  Filled 2022-04-14: qty 1

## 2022-04-14 MED ORDER — FOLIC ACID 1 MG PO TABS
1.0000 mg | ORAL_TABLET | Freq: Every day | ORAL | Status: DC
  Administered 2022-04-14: 1 mg via ORAL
  Filled 2022-04-14: qty 1

## 2022-04-14 MED ORDER — THIAMINE MONONITRATE 100 MG PO TABS
100.0000 mg | ORAL_TABLET | Freq: Every day | ORAL | Status: DC
  Administered 2022-04-14: 100 mg via ORAL
  Filled 2022-04-14: qty 1

## 2022-04-14 MED ORDER — HYDROMORPHONE HCL 1 MG/ML IJ SOLN
1.0000 mg | INTRAMUSCULAR | Status: DC | PRN
  Administered 2022-04-15: 1 mg via INTRAVENOUS
  Filled 2022-04-14: qty 1

## 2022-04-14 MED ORDER — LORAZEPAM 1 MG PO TABS
1.0000 mg | ORAL_TABLET | ORAL | Status: DC | PRN
  Administered 2022-04-14: 2 mg via ORAL
  Filled 2022-04-14: qty 2

## 2022-04-14 NOTE — Plan of Care (Signed)

## 2022-04-14 NOTE — Progress Notes (Signed)
Subjective/Chief Complaint: Still having some RLQ tenderness, but improved Using PRN Dilaudid frequently   Objective: Vital signs in last 24 hours: Temp:  [97.6 F (36.4 C)-98.4 F (36.9 C)] 97.8 F (36.6 C) (11/18 0604) Pulse Rate:  [61-124] 75 (11/18 0604) Resp:  [16-24] 16 (11/18 0604) BP: (120-163)/(67-114) 135/93 (11/18 0604) SpO2:  [96 %-99 %] 99 % (11/18 0604) Last BM Date : 04/12/22  Intake/Output from previous day: 11/17 0701 - 11/18 0700 In: 1364.6 [P.O.:30; I.V.:1334.6] Out: 1000 [Urine:1000] Intake/Output this shift: No intake/output data recorded.  WDWN in NAD Abd - soft, flat Mild tenderness around ostomy site in RLQ Wound clean  Lab Results:  Recent Labs    04/13/22 1049 04/14/22 0513  WBC 10.8* 6.7  HGB 14.6 12.7*  HCT 43.4 38.6*  PLT 366 338   BMET Recent Labs    04/13/22 1140 04/14/22 0513  NA 135 137  K 3.8 4.3  CL 105 107  CO2 25 26  GLUCOSE 100* 90  BUN 16 11  CREATININE 0.93 0.83  CALCIUM 8.3* 8.7*   PT/INR No results for input(s): "LABPROT", "INR" in the last 72 hours. ABG No results for input(s): "PHART", "HCO3" in the last 72 hours.  Invalid input(s): "PCO2", "PO2"  Studies/Results: CT ABDOMEN PELVIS W CONTRAST  Result Date: 04/13/2022 CLINICAL DATA:  Abdominal pain status post ileostomy reversal November 8. History of partial colectomy for rectosigmoid cancer. EXAM: CT ABDOMEN AND PELVIS WITH CONTRAST TECHNIQUE: Multidetector CT imaging of the abdomen and pelvis was performed using the standard protocol following bolus administration of intravenous contrast. RADIATION DOSE REDUCTION: This exam was performed according to the departmental dose-optimization program which includes automated exposure control, adjustment of the mA and/or kV according to patient size and/or use of iterative reconstruction technique. CONTRAST:  117m OMNIPAQUE IOHEXOL 300 MG/ML  SOLN COMPARISON:  Multiple priors including most recent CT January 30, 2022. FINDINGS: Lower chest: No acute abnormality. Hepatobiliary: No suspicious hepatic lesion. Gallbladder is unremarkable. No biliary ductal dilation. Pancreas: No pancreatic ductal dilation or evidence of acute inflammation. Spleen: No splenomegaly. Adrenals/Urinary Tract: Bilateral adrenal glands appear normal. No hydronephrosis. Fluid density 2.8 cm left upper pole renal cyst is compatible with a benign finding requires no imaging follow-up. Urinary bladder is unremarkable for degree of distension. Stomach/Bowel: Postsurgical change of prior partial colectomy with recent ileostomy reversal. Rectosigmoid anastomotic suture lines appear normal. Along the ileostomy surgical lines there is a heterogeneous collection measuring 5.2 x 2.3 cm on image 52/2. Adjacent to this is some stranding. Stomach is unremarkable for degree of distension. No pathologic dilation of small or large bowel. Moderate volume of stool in the ascending colon. Vascular/Lymphatic: Normal caliber abdominal aorta. No pathologically enlarged abdominal or pelvic lymph nodes. Reproductive: Prostate is unremarkable. Other: Small volume pneumoperitoneum predominantly in the right upper quadrant with a few other foci of gas interspersed throughout the anterior abdomen. Small volume pelvic free fluid. Non approximation of the right anterior abdominal wall ostomy site. Mild peritoneal thickening in the right anterior abdominal wall subjacent to the ostomy site. Musculoskeletal: No acute osseous abnormality. IMPRESSION: 1. Postsurgical change of prior partial colectomy with recent ileostomy reversal, with a heterogeneous 5.2 x 2.3 cm fluid collection along the ileostomy surgical lines likely reflecting a postoperative hematoma. 2. Small volume pneumoperitoneum and small volume pelvic free fluid, which is favored postsurgical. Although, an enteric/anastomotic suture leak is not entirely excluded. If clinical concern suggest more definitive  characterization with repeat CT abdomen and pelvis utilizing  oral contrast medium. 3. Non approximation of the right anterior abdominal wall ostomy site with mild peritoneal thickening in the right anterior abdominal wall subjacent to the ostomy site, which may reflect postsurgical change. 4. Moderate volume of stool in the ascending colon. Electronically Signed   By: Dahlia Bailiff M.D.   On: 04/13/2022 11:40    Anti-infectives: Anti-infectives (From admission, onward)    None       Assessment/Plan: S/p loop ileostomy reversal 04/04/22 Post-op hematoma  WBC normal Wound care - BID dressing changes Condition improved - full liquids, PO pain meds Ambulate   LOS: 0 days    Jerry Allison 04/14/2022

## 2022-04-14 NOTE — Progress Notes (Signed)
  Transition of Care [facility]) Screening Note   Patient Details  Name: Jerry Allison Date of Birth: 1975-07-28   Transition of Care [facility]) CM/SW Contact:    Lennart Pall, LCSW Phone Number: 04/14/2022, 9:10 AM    Transition of Care Department [facility]) has reviewed patient and no TOC needs have been identified at this time. We will continue to monitor patient advancement through interdisciplinary progression rounds. If new patient transition needs arise, please place a TOC consult.

## 2022-04-14 NOTE — Progress Notes (Signed)
Called to pt's rm, pt very angry and verbally aggressive, demands scissors from me. Pt cursing at times, very upset about numerous things, pacing in the hallway over and over. Allowed pt to vent and support offered, but pt cont to be agitated and pacing in halls. MD notified of above, will cont to monitor

## 2022-04-15 NOTE — Progress Notes (Signed)
Reviewed written D/C instructions with pt and wife and all questions answered. Pt verbalized understanding. D/C ambulatory with all belongings in stable condition.

## 2022-04-15 NOTE — Discharge Instructions (Signed)
[facility]      [facility] [phone]  OPEN ABDOMINAL SURGERY: POST OP INSTRUCTIONS  Always review your discharge instruction sheet given to you by the facility where your surgery was performed.  IF YOU HAVE DISABILITY OR FAMILY LEAVE FORMS, YOU MUST BRING THEM TO THE OFFICE FOR PROCESSING.  PLEASE DO NOT GIVE THEM TO YOUR DOCTOR.  A prescription for pain medication may be given to you upon discharge.  Take your pain medication as prescribed, if needed.  If narcotic pain medicine is not needed, then you may take acetaminophen (Tylenol) or ibuprofen (Advil) as needed. Take your usually prescribed medications unless otherwise directed. If you need a refill on your pain medication, please contact your pharmacy. They will contact our office to request authorization.  Prescriptions will not be filled after 5pm or on week-ends. You should follow a light diet the first few days after arrival home, such as soup and crackers, pudding, etc.unless your doctor has advised otherwise. A high-fiber, low fat diet can be resumed as tolerated.   Be sure to include lots of fluids daily. Most patients will experience some swelling and bruising on the chest and neck area.  Ice packs will help.  Swelling and bruising can take several days to resolve Most patients will experience some swelling and bruising in the area of the incision. Ice pack will help. Swelling and bruising can take several days to resolve..  It is common to experience some constipation if taking pain medication after surgery.  Increasing fluid intake and taking a stool softener will usually help or prevent this problem from occurring.  A mild laxative (Milk of Magnesia or Miralax) should be taken according to package directions if there are no bowel movements after 48 hours.  You may have steri-strips (small skin tapes) in place directly over the incision.  These strips should be left on the skin for 7-10 days.  If your surgeon used skin  glue on the incision, you may shower in 24 hours.  The glue will flake off over the next 2-3 weeks.  Any sutures or staples will be removed at the office during your follow-up visit. You may find that a light gauze bandage over your incision may keep your staples from being rubbed or pulled. You may shower and replace the bandage daily. ACTIVITIES:  You may resume regular (light) daily activities beginning the next day--such as daily self-care, walking, climbing stairs--gradually increasing activities as tolerated.  You may have sexual intercourse when it is comfortable.  Refrain from any heavy lifting or straining until approved by your doctor. You may drive when you no longer are taking prescription pain medication, you can comfortably wear a seatbelt, and you can safely maneuver your car and apply brakes Return to Work: ___________________________________ You should see your doctor in the office for a follow-up appointment approximately two weeks after your surgery.  Make sure that you call for this appointment within a day or two after you arrive home to insure a convenient appointment time. OTHER INSTRUCTIONS:  _____________________________________________________________ _____________________________________________________________  WHEN TO CALL YOUR DOCTOR: Fever over 101.0 Inability to urinate Nausea and/or vomiting Extreme swelling or bruising Continued bleeding from incision. Increased pain, redness, or drainage from the incision. Difficulty swallowing or breathing Muscle cramping or spasms. Numbness or tingling in hands or feet or around lips.  The clinic staff is available to answer your questions during regular business hours.  Please don't hesitate to call and ask to speak to one of   the nurses if you have concerns.  For further questions, please visit [email or web address]  

## 2022-04-15 NOTE — Discharge Summary (Signed)
Physician Discharge Summary  Patient ID: Jerry Allison MRN: [id number] DOB/AGE: [birth year] 46 y.o.  Admit date: 04/13/2022 Discharge date: 04/15/2022  Admission Diagnoses:  Post-operative hematoma  Discharge Diagnoses: same Principal Problem:   Postoperative abdominal pain   Discharged Condition: good  Hospital Course: Readmitted 04/13/22 with acute RLQ abdominal pain.  CT scan showed a probable hematoma at the site of the ileostomy reversal with no sign of leak or abscess.  His hemoglobin has remained stable and his WBC has returned to normal.  His pain is improved and he is using minimal pain medication.  Tolerating diet.  Ready for discharge.    Significant Diagnostic Studies: radiology:  CLINICAL DATA:  Abdominal pain status post ileostomy reversal November 8. History of partial colectomy for rectosigmoid cancer.   EXAM: CT ABDOMEN AND PELVIS WITH CONTRAST   TECHNIQUE: Multidetector CT imaging of the abdomen and pelvis was performed using the standard protocol following bolus administration of intravenous contrast.   RADIATION DOSE REDUCTION: This exam was performed according to the departmental dose-optimization program which includes automated exposure control, adjustment of the mA and/or kV according to patient size and/or use of iterative reconstruction technique.   CONTRAST:  165m OMNIPAQUE IOHEXOL 300 MG/ML  SOLN   COMPARISON:  Multiple priors including most recent CT January 30, 2022.   FINDINGS: Lower chest: No acute abnormality.   Hepatobiliary: No suspicious hepatic lesion. Gallbladder is unremarkable. No biliary ductal dilation.   Pancreas: No pancreatic ductal dilation or evidence of acute inflammation.   Spleen: No splenomegaly.   Adrenals/Urinary Tract: Bilateral adrenal glands appear normal. No hydronephrosis. Fluid density 2.8 cm left upper pole renal cyst is compatible with a benign finding requires no imaging follow-up. Urinary bladder  is unremarkable for degree of distension.   Stomach/Bowel: Postsurgical change of prior partial colectomy with recent ileostomy reversal. Rectosigmoid anastomotic suture lines appear normal. Along the ileostomy surgical lines there is a heterogeneous collection measuring 5.2 x 2.3 cm on image 52/2. Adjacent to this is some stranding.   Stomach is unremarkable for degree of distension. No pathologic dilation of small or large bowel. Moderate volume of stool in the ascending colon.   Vascular/Lymphatic: Normal caliber abdominal aorta. No pathologically enlarged abdominal or pelvic lymph nodes.   Reproductive: Prostate is unremarkable.   Other: Small volume pneumoperitoneum predominantly in the right upper quadrant with a few other foci of gas interspersed throughout the anterior abdomen. Small volume pelvic free fluid. Non approximation of the right anterior abdominal wall ostomy site. Mild peritoneal thickening in the right anterior abdominal wall subjacent to the ostomy site.   Musculoskeletal: No acute osseous abnormality.   IMPRESSION: 1. Postsurgical change of prior partial colectomy with recent ileostomy reversal, with a heterogeneous 5.2 x 2.3 cm fluid collection along the ileostomy surgical lines likely reflecting a postoperative hematoma. 2. Small volume pneumoperitoneum and small volume pelvic free fluid, which is favored postsurgical. Although, an enteric/anastomotic suture leak is not entirely excluded. If clinical concern suggest more definitive characterization with repeat CT abdomen and pelvis utilizing oral contrast medium. 3. Non approximation of the right anterior abdominal wall ostomy site with mild peritoneal thickening in the right anterior abdominal wall subjacent to the ostomy site, which may reflect postsurgical change. 4. Moderate volume of stool in the ascending colon.     Electronically Signed   By: JDahlia BailiffM.D.   On: 04/13/2022 11:40      Discharge Exam: Blood pressure 127/88, pulse 75, temperature (!) 97.5 F (36.4 C),  temperature source Oral, resp. rate 14, height 6' (1.829 m), weight 77.5 kg, SpO2 97 %. WDWN in NAD Abd - soft, minimal tenderness near wound RLQ wound - clean; minimal drainage  Disposition: Discharge disposition: 01-Home or Self Care       Discharge Instructions     Call MD for:  persistant nausea and vomiting   Complete by: As directed    Call MD for:  redness, tenderness, or signs of infection (pain, swelling, redness, odor or green/yellow discharge around incision site)   Complete by: As directed    Call MD for:  severe uncontrolled pain   Complete by: As directed    Call MD for:  temperature >100.4   Complete by: As directed    Diet general   Complete by: As directed    Discharge wound care:   Complete by: As directed    After each shower, change the dressings - small wet to dry dressing to right lower quadrant wound/ cover with dry gauze and tape   Driving Restrictions   Complete by: As directed    Do not drive while taking pain medications   Increase activity slowly   Complete by: As directed    May shower / Bathe   Complete by: As directed       Allergies as of 04/15/2022       Reactions   Shellfish Allergy Anaphylaxis        Medication List     STOP taking these medications    traMADol 50 MG tablet Commonly known as: Ultram       TAKE these medications    acetaminophen 500 MG tablet Commonly known as: TYLENOL Take 1,000 mg by mouth every 6 (six) hours as needed for moderate pain (pain.).   Ensure Take 237 mLs by mouth daily.   escitalopram 10 MG tablet Commonly known as: LEXAPRO Take 5 mg by mouth at bedtime.   gabapentin 100 MG capsule Commonly known as: Neurontin Take 1 capsule (100 mg total) by mouth at bedtime. Ok to increase t0 3 capsules at night if needed and tolerates well What changed:  how much to take when to take this reasons to  take this additional instructions   ibuprofen 200 MG tablet Commonly known as: ADVIL Take 400 mg by mouth daily as needed (pain.).   Nasacort Allergy 24HR 55 MCG/ACT Aero nasal inhaler Generic drug: triamcinolone Place 1 spray into the nose daily as needed (allergies).   omeprazole 20 MG capsule Commonly known as: PRILOSEC Take 1 capsule (20 mg total) by mouth daily. What changed:  when to take this reasons to take this   oxymetazoline 0.05 % nasal spray Commonly known as: AFRIN Place 1 spray into both nostrils daily as needed for congestion.               Discharge Care Instructions  (From admission, onward)           Start     Ordered   04/15/22 0000  Discharge wound care:       Comments: After each shower, change the dressings - small wet to dry dressing to right lower quadrant wound/ cover with dry gauze and tape   04/15/22 3154            Follow-up Information     Ileana Roup, MD Follow up in 12 day(s).   Specialties: General Surgery, Colon and Rectal Surgery Why: For wound re-check Contact information: [place]  [address] [place] [address] [phone]                 Signed: Maia Petties 04/15/2022, 8:39 AM

## 2022-04-17 NOTE — ED Provider Notes (Signed)
[facility] 3 EAST GENERAL SURGERY Provider Note   CSN: [id number] Arrival date & time: 04/13/22  1015     History  Chief Complaint  Patient presents with   Abdominal Pain    Jerry Allison is a 46 y.o. male.  Patient complains of abdominal pain.  Patient recently had a partial colectomy  The history is provided by the patient and medical records. No language interpreter was used.  Abdominal Pain Pain location:  Generalized Pain quality: aching   Pain radiates to:  Does not radiate Pain severity:  Moderate Onset quality:  Sudden Timing:  Constant Progression:  Worsening Chronicity:  New Context: not alcohol use   Relieved by:  Nothing Worsened by:  Nothing Associated symptoms: no chest pain, no cough, no diarrhea, no fatigue and no hematuria        Home Medications Prior to Admission medications   Medication Sig Start Date End Date Taking? Authorizing Provider  acetaminophen (TYLENOL) 500 MG tablet Take 1,000 mg by mouth every 6 (six) hours as needed for moderate pain (pain.).   Yes [provider]  Ensure (ENSURE) Take 237 mLs by mouth daily.   Yes [provider]  escitalopram (LEXAPRO) 10 MG tablet Take 5 mg by mouth at bedtime. 11/14/20  Yes [provider]  gabapentin (NEURONTIN) 100 MG capsule Take 1 capsule (100 mg total) by mouth at bedtime. Ok to increase t0 3 capsules at night if needed and tolerates well Patient taking differently: Take 100-200 mg by mouth at bedtime as needed (nerve pain). 12/14/21  Yes [name], MD  ibuprofen (ADVIL) 200 MG tablet Take 400 mg by mouth daily as needed (pain.).   Yes [provider]  omeprazole (PRILOSEC) 20 MG capsule Take 1 capsule (20 mg total) by mouth daily. Patient taking differently: Take 20 mg by mouth daily as needed (acid reflux). 09/21/21  Yes [name], MD  oxymetazoline (AFRIN) 0.05 % nasal spray Place 1 spray into both nostrils daily as needed for congestion.   Yes [provider]  triamcinolone (NASACORT ALLERGY 24HR) 55 MCG/ACT AERO nasal inhaler Place 1 spray into the nose daily as needed (allergies).   Yes [provider]      Allergies    Shellfish allergy    Review of Systems   Review of Systems  Constitutional:  Negative for appetite change and fatigue.  HENT:  Negative for congestion, ear discharge and sinus pressure.   Eyes:  Negative for discharge.  Respiratory:  Negative for cough.   Cardiovascular:  Negative for chest pain.  Gastrointestinal:  Positive for abdominal pain. Negative for diarrhea.  Genitourinary:  Negative for frequency and hematuria.  Musculoskeletal:  Negative for back pain.  Skin:  Negative for rash.  Neurological:  Negative for seizures and headaches.  Psychiatric/Behavioral:  Negative for hallucinations.     Physical Exam Updated Vital Signs BP 127/88 (BP Location: Right Arm)   Pulse 75   Temp (!) 97.5 F (36.4 C) (Oral)   Resp 14   Ht 6' (1.829 m)   Wt 77.5 kg   SpO2 97%   BMI 23.17 kg/m  Physical Exam Vitals and nursing note reviewed.  Constitutional:      Appearance: He is well-developed.  HENT:     Head: Normocephalic.  Eyes:     General: No scleral icterus.    Conjunctiva/sclera: Conjunctivae normal.  Neck:     Thyroid: No thyromegaly.  Cardiovascular:     Rate and Rhythm: Normal rate  and regular rhythm.     Heart sounds: No murmur heard.    No friction rub. No gallop.  Pulmonary:     Breath sounds: No stridor. No wheezing or rales.  Chest:     Chest wall: No tenderness.  Abdominal:     General: There is no distension.     Tenderness: There is abdominal tenderness. There is no rebound.  Musculoskeletal:        General: Normal range of motion.     Cervical back: Neck supple.  Lymphadenopathy:     Cervical: No cervical adenopathy.  Skin:    Findings: No erythema or rash.  Neurological:     Mental Status: He is alert and oriented to person, place, and time.     Motor: No  abnormal muscle tone.     Coordination: Coordination normal.  Psychiatric:        Behavior: Behavior normal.     ED Results / Procedures / Treatments   Labs (all labs ordered are listed, but only abnormal results are displayed) Labs Reviewed  CBC WITH DIFFERENTIAL/PLATELET - Abnormal; Notable for the following components:      Result Value   WBC 10.8 (*)    Neutro Abs 8.0 (*)    Monocytes Absolute 1.1 (*)    All other components within normal limits  URINALYSIS, ROUTINE W REFLEX MICROSCOPIC - Abnormal; Notable for the following components:   Specific Gravity, Urine >1.046 (*)    All other components within normal limits  COMPREHENSIVE METABOLIC PANEL - Abnormal; Notable for the following components:   Glucose, Bld 100 (*)    Calcium 8.3 (*)    All other components within normal limits  BASIC METABOLIC PANEL - Abnormal; Notable for the following components:   Calcium 8.7 (*)    Anion gap 4 (*)    All other components within normal limits  CBC - Abnormal; Notable for the following components:   Hemoglobin 12.7 (*)    HCT 38.6 (*)    All other components within normal limits    EKG None  Radiology No results found.  Procedures Procedures    Medications Ordered in ED Medications  HYDROmorphone (DILAUDID) injection 1 mg (1 mg Intravenous Given 04/13/22 1058)  ondansetron (ZOFRAN) injection 4 mg (4 mg Intravenous Given 04/13/22 1055)  sodium chloride 0.9 % bolus 1,000 mL (0 mLs Intravenous Stopped 04/13/22 1221)  iohexol (OMNIPAQUE) 300 MG/ML solution 100 mL (100 mLs Intravenous Contrast Given 04/13/22 1115)  sodium chloride (PF) 0.9 % injection (  Given by Other 04/13/22 1105)  HYDROmorphone (DILAUDID) injection 1 mg (1 mg Intravenous Given 04/13/22 1147)  LORazepam (ATIVAN) injection 0.5 mg (0.5 mg Intravenous Given 04/13/22 1147)    ED Course/ Medical Decision Making/ A&P                           Medical Decision Making Amount and/or Complexity of Data  Reviewed Labs: ordered. Radiology: ordered.  Risk Prescription drug management. Decision regarding hospitalization.     This patient presents to the ED for concern of abdominal pain, this involves an extensive number of treatment options, and is a complaint that carries with it a high risk of complications and morbidity.  The differential diagnosis includes abscess, postop problems from colectomy   Co morbidities that complicate the patient evaluation  Patient colectomy   Additional history obtained:  Additional history obtained from family External records from outside source obtained and reviewed including  hospital records   Lab Tests:  I Ordered, and personally interpreted labs.  The pertinent results include: WBC 10.8   Imaging Studies ordered:  I ordered imaging studies including CT abdomen I independently visualized and interpreted imaging which showed a collection abdomen near surgical site I agree with the radiologist interpretation   Cardiac Monitoring: / EKG:  The patient was maintained on a cardiac monitor.  I personally viewed and interpreted the cardiac monitored which showed an underlying rhythm of: Normal sinus rhythm   Consultations Obtained:  I requested consultation with the neurosurgery,  and discussed lab and imaging findings as well as pertinent plan - they recommend: Admission   Problem List / ED Course / Critical interventions / Medication management  [place]ino pain I ordered medication including Dilaudid for pain Reevaluation of the patient after these medicines showed that the patient improved I have reviewed the patients home medicines and have made adjustments as needed   Social Determinants of Health:  None none   Test / Admission - Considered:  none  Patient with abdominal pain and recent partial colectomy.  Patient has 5.2x2.3 fluid collection along the surgical lines.  He will be admitted to surgery        Final  Clinical Impression(s) / ED Diagnoses Final diagnoses:  None    Rx / DC Orders ED Discharge Orders          Ordered    Diet general        04/15/22 0838    Increase activity slowly        04/15/22 0838    May shower / Bathe        04/15/22 0838    Driving Restrictions       Comments: Do not drive while taking pain medications   04/15/22 0838    Call MD for:  temperature >100.4        04/15/22 0838    Call MD for:  persistant nausea and vomiting        04/15/22 0838    Call MD for:  severe uncontrolled pain        04/15/22 0838    Call MD for:  redness, tenderness, or signs of infection (pain, swelling, redness, odor or green/yellow discharge around incision site)        04/15/22 0838    Discharge wound care:       Comments: After each shower, change the dressings - small wet to dry dressing to right lower quadrant wound/ cover with dry gauze and tape   04/15/22 [id number]              Milton Ferguson, MD 04/17/22 1210

## 2022-05-30 ENCOUNTER — Other Ambulatory Visit: Payer: Self-pay | Admitting: Physician Assistant

## 2022-05-30 DIAGNOSIS — C20 Malignant neoplasm of rectum: Secondary | ICD-10-CM

## 2022-05-30 NOTE — Progress Notes (Unsigned)
Jerry Allison OFFICE PROGRESS NOTE  Derinda Late, MD [phone] [facility] [place] [address]  DIAGNOSIS: f/u of rectal cancer   Oncology History Overview Note   Cancer Staging  Adenocarcinoma of rectum [facility]) Staging form: Colon and Rectum - Neuroendocine Tumors, AJCC 8th Edition - Clinical stage from 03/29/2021: Stage IIB (cT3, cN0, cM0) - Signed by [name], MD on 06/20/2021    Adenocarcinoma of rectum ([place])  03/07/2021 Procedure   Colonoscopy, Dr. [name]  Impression - Malignant appearing partially obstructing tumor in the proximal rectum. Biopsied. Tattooed. - One 3 mm polyp in the distal rectum, removed with a cold snare. Resected and retrieved. - The examined portion of the ileum was normal. - The distal rectum and anal verge are normal on retroflexion view.   03/07/2021 Pathology Results   Diagnosis 1. Rectum, biopsy, mass - ADENOCARCINOMA - SEE COMMENT 2. Rectum, biopsy, polyp (1) - TUBULAR ADENOMA (1 OF 1 FRAGMENTS) - NO HIGH-GRADE DYSPLASIA OR MALIGNANCY IDENTIFIED   03/09/2021 Imaging   CT CAP  IMPRESSION: 1. Asymmetric wall thickening in the proximal/mid rectum may correspond to mass seen on endoscopy. 2. Negative for adenopathy or evidence of distal metastatic disease.   03/27/2021 Imaging   MRI Pelvis  IMPRESSION: 1. Low sigmoid primary, greater than 15 cm from the anal verge, as detailed above. Poorly delineated secondary to underdistention in this region. 2. No evidence of pelvic nodal metastasis. 3. Trace perisigmoid fluid, new since the prior CT.   03/28/2021 Initial Diagnosis   Adenocarcinoma of rectum ([place])   03/29/2021 Cancer Staging   Staging form: Colon and Rectum - Neuroendocine Tumors, AJCC 8th Edition - Clinical stage from 03/29/2021: Stage IIB (cT3, cN0, cM0) - Signed by [name], MD on 06/20/2021 Stage prefix: Initial diagnosis Histologic grade (G): G2 Histologic  grading system: 3 grade system   04/12/2021 Genetic Testing   Negative genetic testing on the CancerNext-Expanded+RNAinsight panel.  The report date is 04/12/2021.  The CancerNext-Expanded gene panel offered by [facility] and includes sequencing and rearrangement analysis for the following 77 genes: AIP, ALK, APC*, ATM*, AXIN2, BAP1, BARD1, BLM, BMPR1A, BRCA1*, BRCA2*, BRIP1*, CDC73, CDH1*, CDK4, CDKN1B, CDKN2A, CHEK2*, CTNNA1, DICER1, FANCC, FH, FLCN, GALNT12, KIF1B, LZTR1, MAX, MEN1, MET, MLH1*, MSH2*, MSH3, MSH6*, MUTYH*, NBN, NF1*, NF2, NTHL1, PALB2*, PHOX2B, PMS2*, POT1, PRKAR1A, PTCH1, PTEN*, RAD51C*, RAD51D*, RB1, RECQL, RET, SDHA, SDHAF2, SDHB, SDHC, SDHD, SMAD4, SMARCA4, SMARCB1, SMARCE1, STK11, SUFU, TMEM127, TP53*, TSC1, TSC2, VHL and XRCC2 (sequencing and deletion/duplication); EGFR, EGLN1, HOXB13, KIT, MITF, PDGFRA, POLD1, and POLE (sequencing only); EPCAM and GREM1 (deletion/duplication only). DNA and RNA analyses performed for * genes.    04/27/2021 Procedure   EUS, Dr. Ardis Hughs  Impression: - 4cm long, circumferential, at least uT3Nx rectal adenocarcinoma with distal edge located 10cm from the anal verge. 1.5cm satellite nodule located 5-77m distal to the tumor (around 9cm from the anal verge).   05/10/2021 - 10/04/2021 Chemotherapy   Patient is on Treatment Plan : RECTAL Xelox ([place]apeox) q21d x 6 cycles      01/12/2022 Definitive Surgery   FINAL MICROSCOPIC DIAGNOSIS:   A. COLON, RECTOSIGMOID, RESECTION:  - Fibrosis and mild chronic inflammation consistent with treatment  effect.  - No residual carcinoma (ypT0).  - All surgical margins negative for tumor.  - Eighteen lymph nodes negative for metastatic carcinoma (0/18)(ypN0).  - See oncology table.      CURRENT THERAPY: Surveillance   INTERVAL HISTORY: Jerry Allison  47 y.o. male returns to the clinic today for a follow-up visit.  The patient was last seen by Dr. Burr Medico approximately 4 months ago.  The patient is  currently on observation from his history of colorectal cancer.  In the interval since last being seen, the patient had a ileostomy reversal in November 2023 under the care of Dr. Dema Severin.  The patient had a postoperative hematoma.  He is following closely with Dr. Dema Severin from general surgery.  His next appointment is in a few months.  While the patient was in the emergency room/hospital he did have a repeat CT scan of the abdomen and pelvis on 04/13/2022 which did not show any evidence of recurrent or metastatic disease. Overall, the patient is getting to the point where he is able to eat what he wants and is working on building his stamina back up. He is gaining weight following his recent surgery but is still not back to his baseline where he was prior. The patient denies any fever, chills, or night sweats.  He denies any nausea, vomiting, blood in his stool, dysphagia, odynophagia, jaundice, or itching. He does sometimes have intermittent diarrhea or constipation. He may have an occasional abdominal pain but nothing regular. He is no longer taking gabapentin for peripheral neuropathy. Overall, he is functional with his normal activities. He did not see much improvement with gabapentin.  Denies any chest pain, shortness of breath, cough, or hemoptysis.  He is here today for evaluation and repeat blood work.   MEDICAL HISTORY: Past Medical History:  Diagnosis Date   Anxiety    Depression    Family history of bladder cancer    Rectal cancer ([place]) 02/2021    ALLERGIES:  is allergic to shellfish allergy.  MEDICATIONS:  Current Outpatient Medications  Medication Sig Dispense Refill   acetaminophen (TYLENOL) 500 MG tablet Take 1,000 mg by mouth every 6 (six) hours as needed for moderate pain (pain.).     Ensure (ENSURE) Take 237 mLs by mouth daily.     escitalopram (LEXAPRO) 10 MG tablet Take 5 mg by mouth at bedtime.     gabapentin (NEURONTIN) 100 MG capsule Take 1 capsule (100 mg total) by mouth at  bedtime. Ok to increase t0 3 capsules at night if needed and tolerates well (Patient taking differently: Take 100-200 mg by mouth at bedtime as needed (nerve pain).) 90 capsule 0   ibuprofen (ADVIL) 200 MG tablet Take 400 mg by mouth daily as needed (pain.).     omeprazole (PRILOSEC) 20 MG capsule Take 1 capsule (20 mg total) by mouth daily. (Patient taking differently: Take 20 mg by mouth daily as needed (acid reflux).) 30 capsule 0   oxymetazoline (AFRIN) 0.05 % nasal spray Place 1 spray into both nostrils daily as needed for congestion.     triamcinolone (NASACORT ALLERGY 24HR) 55 MCG/ACT AERO nasal inhaler Place 1 spray into the nose daily as needed (allergies).     No current facility-administered medications for this visit.    SURGICAL HISTORY:  Past Surgical History:  Procedure Laterality Date   COLONOSCOPY     EUS N/A 04/27/2021   Procedure: LOWER ENDOSCOPIC ULTRASOUND (EUS);  Surgeon: [name], MD;  Location: [facility];  Service: Endoscopy;  Laterality: N/A;   FLEXIBLE SIGMOIDOSCOPY N/A 04/27/2021   Procedure: FLEXIBLE SIGMOIDOSCOPY;  Surgeon: [name], MD;  Location: [facility];  Service: Endoscopy;  Laterality: N/A;   FLEXIBLE SIGMOIDOSCOPY N/A 01/12/2022   Procedure: FLEXIBLE SIGMOIDOSCOPY;  Surgeon:  Ileana Roup, MD;  Location: [facility];  Service: General;  Laterality: N/A;   FLEXIBLE SIGMOIDOSCOPY N/A 03/07/2022   Procedure: Beryle Quant;  Surgeon: Ileana Roup, MD;  Location: [facility];  Service: General;  Laterality: N/A;   ILEO LOOP DIVERSION N/A 01/12/2022   Procedure: DIVERTING LOOP ILEOSTOMY;  Surgeon: Ileana Roup, MD;  Location: [facility];  Service: General;  Laterality: N/A;   ILEOSTOMY CLOSURE N/A 04/04/2022   Procedure: ILEOSTOMY TAKEDOWN;  Surgeon: Ileana Roup, MD;  Location: [facility];  Service: General;  Laterality: N/A;   NO PAST SURGERIES     XI ROBOTIC ASSISTED LOWER ANTERIOR RESECTION N/A 01/12/2022    Procedure: XI ROBOTIC ASSISTED LOWER ANTERIOR RESECTION;  Surgeon: Ileana Roup, MD;  Location: [facility];  Service: General;  Laterality: N/A;    REVIEW OF SYSTEMS:   Review of Systems  Constitutional: Negative for appetite change, chills, fatigue, fever and unexpected weight change.  HENT:   Negative for mouth sores, nosebleeds, sore throat and trouble swallowing.   Eyes: Negative for eye problems and icterus.  Respiratory: Negative for cough, hemoptysis, shortness of breath and wheezing.   Cardiovascular: Negative for chest pain and leg swelling.  Gastrointestinal: Positive for intermittent diarrhea and constipation. Negative for abdominal pain, nausea and vomiting.  Genitourinary: Negative for bladder incontinence, difficulty urinating, dysuria, frequency and hematuria.   Musculoskeletal: Negative for back pain, gait problem, neck pain and neck stiffness.  Skin: Negative for itching and rash.  Neurological: Negative for dizziness, extremity weakness, gait problem, headaches, light-headedness and seizures.  Hematological: Negative for adenopathy. Does not bruise/bleed easily.  Psychiatric/Behavioral: Negative for confusion, depression and sleep disturbance. The patient is not nervous/anxious.     PHYSICAL EXAMINATION:  Blood pressure 139/85, pulse 74, temperature (!) 97.5 F (36.4 C), temperature source Temporal, resp. rate 17, height 6' (1.829 m), weight 176 lb 8 oz (80.1 kg), SpO2 100 %.  ECOG PERFORMANCE STATUS: 0-1  Physical Exam  Constitutional: Oriented to person, place, and time and well-developed, well-nourished, and in no distress.  HENT:  Head: Normocephalic and atraumatic.  Mouth/Throat: Oropharynx is clear and moist. No oropharyngeal exudate.  Eyes: Conjunctivae are normal. Right eye exhibits no discharge. Left eye exhibits no discharge. No scleral icterus.  Neck: Normal range of motion. Neck supple.  Cardiovascular: Normal rate, regular rhythm, normal heart sounds  and intact distal pulses.   Pulmonary/Chest: Effort normal and breath sounds normal. No respiratory distress. No wheezes. No rales.  Abdominal: Soft. Bowel sounds are normal. Exhibits no distension and no mass. There is no tenderness. Well healing scar on abdomen from recent ileostomy reversal. Area of scab in the center. No discharge or bleeding.  Musculoskeletal: Normal range of motion. Exhibits no edema.  Lymphadenopathy:    No cervical adenopathy.  Neurological: Alert and oriented to person, place, and time. Exhibits normal muscle tone. Gait normal. Coordination normal.  Skin: Skin is warm and dry. No rash noted. Not diaphoretic. No erythema. No pallor.  Psychiatric: Mood, memory and judgment normal.  Vitals reviewed.  LABORATORY DATA: Lab Results  Component Value Date   WBC 5.1 05/31/2022   HGB 14.0 05/31/2022   HCT 41.6 05/31/2022   MCV 89.8 05/31/2022   PLT 281 05/31/2022      Chemistry      Component Value Date/Time   NA 141 05/31/2022 0944   K 4.0 05/31/2022 0944   CL 107 05/31/2022 0944   CO2 28 05/31/2022 0944   BUN 10 05/31/2022  0944   CREATININE 1.12 05/31/2022 0944      Component Value Date/Time   CALCIUM 9.6 05/31/2022 0944   ALKPHOS 69 05/31/2022 0944   AST 20 05/31/2022 0944   ALT 24 05/31/2022 0944   BILITOT 0.5 05/31/2022 0944       RADIOGRAPHIC STUDIES:  No results found.   ASSESSMENT/PLAN:  Jerry Allison is a 47 y.o. male with    1. 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 Colonoscopy on 03/07/21 by Dr. [name] showed a partially obstructing tumor in proximal rectum. Biopsy confirmed adenocarcinoma and a tubular adenoma polyp. -baseline CEA that day was normal at 1.7. -staging CT CAP 03/09/21 was negative for metastatic disease. -pelvic MRI 03/27/21 was also negative for nodal metastasis but was not able to give T stage  -he underwent EUS on 04/27/21 with Dr. Ardis Hughs  showing: 4 cm rectal adenocarcinoma invading into and through the muscularis propria layer of rectal wall; 1.5 cm satellite nodule located 5-8 mm distal to tumor. This is staged as T3 -he began CAPOX on 05/10/21. He tolerated first cycle poorly with a lot of vomiting and fatigue. He tolerated cycle 2 better with Emend and reduced oxali dose. We held cycle 3 due to worsening transaminitis. Abdomen US on 06/29/21 was negative for metastatic disease, showed only mild steatosis. He completed 6 cycles on 09/21/21. -he received concurrent chemoRT with Xeloda 10/09/21 - 11/15/21. He tolerated relatively well overall, with expected GI side effects from radiation towards the end of treatment. -restaging CT CAP performed 11/14/21 showed decreased rectal thickening and no metastatic disease.  -s/p resection 12/1821 by Dr. Dema Severin, path showed complete pathological response, with no residual carcinoma. His postoperative course has been complicated by recurrent obstructions. -The surveillance plan, which is a physical exam and lab test (including CBC, CMP and CEA) every 3 months for the first 2 years, then every 6-12 months, colonoscopy in one year, and surveilliance CT scan every 6-12 month for up to 5 year.  -Most recent CT scan performed in ER on 04/13/22. No evidence of recurrent disease.  -CBC and CMP unremarkable today. CEA, iron, and ferritin pending. If concerning findings, I would call the patient with further instructions. Thus far, no clinical concern for recurrence at this time. -Will arrange restaging CT CAP in May 2024 (6 months from last scan). We will see him a few days later for repeat labs and to review the results.     2. 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 -Dr. Burr Medico previously reviewed low fiber diet, and she provided him with a handout with further recommendations. -He was referred to nutrition and saw them in September 2023.    3. 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 On Gabapentin 300 mg p.o. nightly.  -Today (05/31/22) he no longer is taking gabapentin. He is functional and not interested in taking this at this time. Knows we would be happy to refill if he changes his mind.   4. Hematoma -Hospitalized November 2023. Had hematoma at site of ileostomy reversal.  -Feeling better at this time. Expected to follow up with Dr. Dema Severin in a few months.        PLAN:  -Last scan 11/17 in ER. Repeat surveillance scans every 6 month, next due May 2024. Will place order now -lab and f/u in 4 months after  CT scan -CEA pending    Orders Placed This Encounter  Procedures   CT Chest W Contrast    Standing Status:   Future    Standing Expiration Date:   05/31/2023    Order Specific Question:   If indicated for the ordered procedure, I authorize the administration of contrast media per Radiology protocol    Answer:   Yes    Order Specific Question:   Does the patient have a contrast media/X-ray dye allergy?    Answer:   No    Order Specific Question:   Preferred imaging location?    Answer:   [facility]   CT Abdomen Pelvis W Contrast    Standing Status:   Future    Standing Expiration Date:   05/31/2023    Order Specific Question:   If indicated for the ordered procedure, I authorize the administration of contrast media per Radiology protocol    Answer:   Yes    Order Specific Question:   Does the patient have a contrast media/X-ray dye allergy?    Answer:   No    Order Specific Question:   Preferred imaging location?    Answer:   [facility]    Order Specific Question:   Is Oral Contrast requested for this exam?    Answer:   Yes, Per Radiology protocol      The total time spent in the appointment was 20-29 minutes.   Chrystle Murillo L Yussef Jorge, PA-C 05/31/22

## 2022-05-31 ENCOUNTER — Inpatient Hospital Stay (HOSPITAL_BASED_OUTPATIENT_CLINIC_OR_DEPARTMENT_OTHER): Payer: BC Managed Care – PPO | Admitting: Physician Assistant

## 2022-05-31 ENCOUNTER — Other Ambulatory Visit: Payer: Self-pay

## 2022-05-31 ENCOUNTER — Inpatient Hospital Stay: Payer: BC Managed Care – PPO | Attending: Physician Assistant

## 2022-05-31 ENCOUNTER — Ambulatory Visit: Payer: BC Managed Care – PPO | Admitting: Hematology

## 2022-05-31 VITALS — BP 139/85 | HR 74 | Temp 97.5°F | Resp 17 | Ht 72.0 in | Wt 176.5 lb

## 2022-05-31 DIAGNOSIS — C20 Malignant neoplasm of rectum: Secondary | ICD-10-CM

## 2022-05-31 DIAGNOSIS — Z79899 Other long term (current) drug therapy: Secondary | ICD-10-CM | POA: Diagnosis not present

## 2022-05-31 DIAGNOSIS — T451X5A Adverse effect of antineoplastic and immunosuppressive drugs, initial encounter: Secondary | ICD-10-CM | POA: Diagnosis not present

## 2022-05-31 DIAGNOSIS — G620 Drug-induced polyneuropathy: Secondary | ICD-10-CM | POA: Insufficient documentation

## 2022-05-31 LAB — CBC WITH DIFFERENTIAL (CANCER CENTER ONLY)
Abs Immature Granulocytes: 0.01 10*3/uL (ref 0.00–0.07)
Basophils Absolute: 0 10*3/uL (ref 0.0–0.1)
Basophils Relative: 1 %
Eosinophils Absolute: 0.2 10*3/uL (ref 0.0–0.5)
Eosinophils Relative: 5 %
HCT: 41.6 % (ref 39.0–52.0)
Hemoglobin: 14 g/dL (ref 13.0–17.0)
Immature Granulocytes: 0 %
Lymphocytes Relative: 22 %
Lymphs Abs: 1.1 10*3/uL (ref 0.7–4.0)
MCH: 30.2 pg (ref 26.0–34.0)
MCHC: 33.7 g/dL (ref 30.0–36.0)
MCV: 89.8 fL (ref 80.0–100.0)
Monocytes Absolute: 0.7 10*3/uL (ref 0.1–1.0)
Monocytes Relative: 14 %
Neutro Abs: 3 10*3/uL (ref 1.7–7.7)
Neutrophils Relative %: 58 %
Platelet Count: 281 10*3/uL (ref 150–400)
RBC: 4.63 MIL/uL (ref 4.22–5.81)
RDW: 14.2 % (ref 11.5–15.5)
WBC Count: 5.1 10*3/uL (ref 4.0–10.5)
nRBC: 0 % (ref 0.0–0.2)

## 2022-05-31 LAB — CMP (CANCER CENTER ONLY)
ALT: 24 U/L (ref 0–44)
AST: 20 U/L (ref 15–41)
Albumin: 4.2 g/dL (ref 3.5–5.0)
Alkaline Phosphatase: 69 U/L (ref 38–126)
Anion gap: 6 (ref 5–15)
BUN: 10 mg/dL (ref 6–20)
CO2: 28 mmol/L (ref 22–32)
Calcium: 9.6 mg/dL (ref 8.9–10.3)
Chloride: 107 mmol/L (ref 98–111)
Creatinine: 1.12 mg/dL (ref 0.61–1.24)
GFR, Estimated: >60 mL/min (ref >60)
Glucose, Bld: 122 mg/dL — ABNORMAL HIGH (ref 70–99)
Potassium: 4 mmol/L (ref 3.5–5.1)
Sodium: 141 mmol/L (ref 135–145)
Total Bilirubin: 0.5 mg/dL (ref 0.3–1.2)
Total Protein: 6.6 g/dL (ref 6.5–8.1)

## 2022-05-31 LAB — IRON AND IRON BINDING CAPACITY (CC-WL,HP ONLY)
Iron: 73 ug/dL (ref 45–182)
Saturation Ratios: 22 % (ref 17.9–39.5)
TIBC: 336 ug/dL (ref 250–450)
UIBC: 263 ug/dL (ref 117–376)

## 2022-05-31 LAB — CEA (ACCESS): CEA (CHCC): 1.3 ng/mL (ref 0.00–5.00)

## 2022-05-31 LAB — FERRITIN: Ferritin: 30 ng/mL (ref 24–336)

## 2022-10-09 ENCOUNTER — Ambulatory Visit (HOSPITAL_COMMUNITY)
Admission: RE | Admit: 2022-10-09 | Discharge: 2022-10-09 | Disposition: A | Discharge status: Home / Self Care | Payer: BC Managed Care – PPO | Source: Ambulatory Visit | Attending: Physician Assistant | Admitting: Physician Assistant

## 2022-10-09 DIAGNOSIS — C20 Malignant neoplasm of rectum: Secondary | ICD-10-CM

## 2022-10-09 MED ORDER — IOHEXOL 300 MG/ML  SOLN
100.0000 mL | Freq: Once | INTRAMUSCULAR | Status: AC | PRN
  Administered 2022-10-09: 100 mL via INTRAVENOUS

## 2022-10-09 MED ORDER — IOHEXOL 9 MG/ML PO SOLN
ORAL | Status: AC
  Filled 2022-10-09: qty 1000

## 2022-10-09 MED ORDER — SODIUM CHLORIDE (PF) 0.9 % IJ SOLN
INTRAMUSCULAR | Status: AC
  Filled 2022-10-09: qty 50

## 2022-10-09 MED ORDER — IOHEXOL 9 MG/ML PO SOLN
500.0000 mL | ORAL | Status: AC
  Administered 2022-10-09 (×2): 500 mL via ORAL

## 2022-10-11 ENCOUNTER — Encounter: Payer: Self-pay | Admitting: Physician Assistant

## 2022-10-12 ENCOUNTER — Telehealth: Payer: Self-pay

## 2022-10-12 NOTE — Telephone Encounter (Signed)
Patient called wanting to know results of the CT scan. Would like to have someone call to give him results.

## 2022-10-15 ENCOUNTER — Encounter: Payer: Self-pay | Admitting: Hematology

## 2022-10-23 NOTE — Progress Notes (Unsigned)
[facility]   [phone] [phone]   Clinic Follow up Note   Patient Care Team: Jerry Rud, MD as PCP - General (Family Medicine) Jerry Lucks, MD as Consulting Physician (Gastroenterology) Jerry Mood, MD as Consulting Physician (Hematology) Jerry Allison (Inactive) Jerry Puffer, MD as Consulting Physician (Radiation Oncology) Jerry Hurt, MD as Referring Physician (Surgical Oncology)  Date of Service:  10/24/2022  CHIEF COMPLAINT: f/u of rectal cancer   CURRENT THERAPY:   Surveillance   ASSESSMENT:  Jerry Allison is a 47 y.o. male with   Adenocarcinoma of rectum (HCC) -uT3, cN0, cM0, stage II, ypT0N0, MSS  -presented with worsening hematochezia. Colonoscopy on 03/07/21 by Dr. Tomasa Allison showed a partially obstructing tumor in proximal rectum. Biopsy confirmed adenocarcinoma and a tubular adenoma polyp. -baseline CEA that day was normal at 1.7. -staging CT CAP 03/09/21 was negative for metastatic disease. -pelvic MRI 03/27/21 was also negative for nodal metastasis but was not able to give T stage  -he underwent EUS on 04/27/21 with Dr. Christella Allison showing: 4 cm rectal adenocarcinoma invading into and through the muscularis propria layer of rectal wall; 1.5 cm satellite nodule located 5-8 mm distal to tumor. This is staged as T3 -he began CAPOX on 05/10/21. He tolerated first cycle poorly with a lot of vomiting and fatigue. He tolerated cycle 2 better with Emend and reduced oxali dose. We held cycle 3 due to worsening transaminitis. Abdomen US on 06/29/21 was negative for metastatic disease, showed only mild steatosis. He completed 6 cycles on 09/21/21. -he received concurrent chemoRT with Xeloda 10/09/21 - 11/15/21. He tolerated relatively well overall, with expected GI side effects from radiation towards the end of treatment. -restaging CT CAP performed 11/14/21 showed decreased rectal thickening and no metastatic disease.  -s/p resection  12/1821 by Dr. Cliffton Allison, path showed complete pathological response, with no residual carcinoma. His postoperative course was complicated by recurrent obstructions. -CT from 10/09/2022 showed no evidence of recurrent or metastatic carcinoma. I reviewed with pt  -Patient is clinically doing very well, asymptomatic, has recovered well from chemo and surgery.  There is no clinical concern for recurrence -Will continue cancer surveillance    PLAN: -lab reviewed - Tumor marker -pending -Next CT  scan In 1 year - f/u scheduled  with Dr. Cliffton Allison 5/30, will discuss with Dr. Cliffton Allison about his next colonoscopy which he is due soon -lab and f/u in 4 months  SUMMARY OF ONCOLOGIC HISTORY: Oncology History Overview Note   Cancer Staging  Adenocarcinoma of rectum [employer]) Staging form: Colon and Rectum - Neuroendocine Tumors, AJCC 8th Edition - Clinical stage from 03/29/2021: Stage IIB (cT3, cN0, cM0) - Signed by Jerry Mood, MD on 06/20/2021    Adenocarcinoma of rectum (HCC)  03/07/2021 Procedure   Colonoscopy, Dr. Tomasa Allison  Impression - Malignant appearing partially obstructing tumor in the proximal rectum. Biopsied. Tattooed. - One 3 mm polyp in the distal rectum, removed with a cold snare. Resected and retrieved. - The examined portion of the ileum was normal. - The distal rectum and anal verge are normal on retroflexion view.   03/07/2021 Pathology Results   Diagnosis 1. Rectum, biopsy, mass - ADENOCARCINOMA - SEE COMMENT 2. Rectum, biopsy, polyp (1) - TUBULAR ADENOMA (1 OF 1 FRAGMENTS) - NO HIGH-GRADE DYSPLASIA OR MALIGNANCY IDENTIFIED   03/09/2021 Imaging   CT CAP  IMPRESSION: 1. Asymmetric wall thickening in the proximal/mid rectum may correspond to mass seen on endoscopy. 2. Negative for adenopathy or evidence of distal metastatic  disease.   03/27/2021 Imaging   MRI Pelvis  IMPRESSION: 1. Low sigmoid primary, greater than 15 cm from the anal verge, as detailed above. Poorly  delineated secondary to underdistention in this region. 2. No evidence of pelvic nodal metastasis. 3. Trace perisigmoid fluid, new since the prior CT.   03/28/2021 Initial Diagnosis   Adenocarcinoma of rectum (HCC)   03/29/2021 Cancer Staging   Staging form: Colon and Rectum - Neuroendocine Tumors, AJCC 8th Edition - Clinical stage from 03/29/2021: Stage IIB (cT3, cN0, cM0) - Signed by Jerry Mood, MD on 06/20/2021 Stage prefix: Initial diagnosis Histologic grade (G): G2 Histologic grading system: 3 grade system   04/12/2021 Genetic Testing   Negative genetic testing on the CancerNext-Expanded+RNAinsight panel.  The report date is 04/12/2021.  The CancerNext-Expanded gene panel offered by [facility] and includes sequencing and rearrangement analysis for the following 77 genes: AIP, ALK, APC*, ATM*, AXIN2, BAP1, BARD1, BLM, BMPR1A, BRCA1*, BRCA2*, BRIP1*, CDC73, CDH1*, CDK4, CDKN1B, CDKN2A, CHEK2*, CTNNA1, DICER1, FANCC, FH, FLCN, GALNT12, KIF1B, LZTR1, MAX, MEN1, MET, MLH1*, MSH2*, MSH3, MSH6*, MUTYH*, NBN, NF1*, NF2, NTHL1, PALB2*, PHOX2B, PMS2*, POT1, PRKAR1A, PTCH1, PTEN*, RAD51C*, RAD51D*, RB1, RECQL, RET, SDHA, SDHAF2, SDHB, SDHC, SDHD, SMAD4, SMARCA4, SMARCB1, SMARCE1, STK11, SUFU, TMEM127, TP53*, TSC1, TSC2, VHL and XRCC2 (sequencing and deletion/duplication); EGFR, EGLN1, HOXB13, KIT, MITF, PDGFRA, POLD1, and POLE (sequencing only); EPCAM and GREM1 (deletion/duplication only). DNA and RNA analyses performed for * genes.    04/27/2021 Procedure   EUS, Dr. Christella Allison  Impression: - 4cm long, circumferential, at least uT3Nx rectal adenocarcinoma with distal edge located 10cm from the anal verge. 1.5cm satellite nodule located 5-42mm distal to the tumor (around 9cm from the anal verge).   05/10/2021 - 10/04/2021 Chemotherapy   Patient is on Treatment Plan : RECTAL Xelox (Capeox) q21d x 6 cycles      01/12/2022 Definitive Surgery   FINAL MICROSCOPIC DIAGNOSIS:   A. COLON, RECTOSIGMOID,  RESECTION:  - Fibrosis and mild chronic inflammation consistent with treatment  effect.  - No residual carcinoma (ypT0).  - All surgical margins negative for tumor.  - Eighteen lymph nodes negative for metastatic carcinoma (0/18)(ypN0).  - See oncology table.       INTERVAL HISTORY:  Jerry Allison is here for a follow up of rectal cancer. He was last seen by Cassie PA-C on 05/31/2022 He presents to the clinic alone. Pt state that he has some shingle a couple a weeks and his is taking Gabapentin. Pt states that he BM is getting back to normal. Pt denies having any residual problems from previous chemo.      All other systems were reviewed with the patient and are negative.  MEDICAL HISTORY:  Past Medical History:  Diagnosis Date   Anxiety    Depression    Family history of bladder cancer    Rectal cancer (HCC) 02/2021    SURGICAL HISTORY: Past Surgical History:  Procedure Laterality Date   COLONOSCOPY     EUS N/A 04/27/2021   Procedure: LOWER ENDOSCOPIC ULTRASOUND (EUS);  Surgeon: Rachael Fee, MD;  Location: [facility];  Service: Endoscopy;  Laterality: N/A;   FLEXIBLE SIGMOIDOSCOPY N/A 04/27/2021   Procedure: FLEXIBLE SIGMOIDOSCOPY;  Surgeon: Rachael Fee, MD;  Location: [facility];  Service: Endoscopy;  Laterality: N/A;   FLEXIBLE SIGMOIDOSCOPY N/A 01/12/2022   Procedure: FLEXIBLE SIGMOIDOSCOPY;  Surgeon: Andria Meuse, MD;  Location: [facility];  Service: General;  Laterality: N/A;   FLEXIBLE SIGMOIDOSCOPY N/A 03/07/2022   Procedure:  FLEXIBLE SIGMOIDOSCOPY;  Surgeon: Andria Meuse, MD;  Location: [facility];  Service: General;  Laterality: N/A;   ILEO LOOP DIVERSION N/A 01/12/2022   Procedure: DIVERTING LOOP ILEOSTOMY;  Surgeon: Andria Meuse, MD;  Location: [facility];  Service: General;  Laterality: N/A;   ILEOSTOMY CLOSURE N/A 04/04/2022   Procedure: ILEOSTOMY TAKEDOWN;  Surgeon: Andria Meuse, MD;  Location: [facility];  Service: General;   Laterality: N/A;   NO PAST SURGERIES     XI ROBOTIC ASSISTED LOWER ANTERIOR RESECTION N/A 01/12/2022   Procedure: XI ROBOTIC ASSISTED LOWER ANTERIOR RESECTION;  Surgeon: Andria Meuse, MD;  Location: [facility];  Service: General;  Laterality: N/A;    I have reviewed the social history and family history with the patient and they are unchanged from previous note.  ALLERGIES:  is allergic to shellfish allergy.  MEDICATIONS:  Current Outpatient Medications  Medication Sig Dispense Refill   acetaminophen (TYLENOL) 500 MG tablet Take 1,000 mg by mouth every 6 (six) hours as needed for moderate pain (pain.).     Ensure (ENSURE) Take 237 mLs by mouth daily.     escitalopram (LEXAPRO) 10 MG tablet Take 5 mg by mouth at bedtime.     gabapentin (NEURONTIN) 100 MG capsule Take 1 capsule (100 mg total) by mouth at bedtime. Ok to increase t0 3 capsules at night if needed and tolerates well (Patient taking differently: Take 100-200 mg by mouth at bedtime as needed (nerve pain).) 90 capsule 0   ibuprofen (ADVIL) 200 MG tablet Take 400 mg by mouth daily as needed (pain.).     omeprazole (PRILOSEC) 20 MG capsule Take 1 capsule (20 mg total) by mouth daily. (Patient taking differently: Take 20 mg by mouth daily as needed (acid reflux).) 30 capsule 0   oxymetazoline (AFRIN) 0.05 % nasal spray Place 1 spray into both nostrils daily as needed for congestion.     triamcinolone (NASACORT ALLERGY 24HR) 55 MCG/ACT AERO nasal inhaler Place 1 spray into the nose daily as needed (allergies).     No current facility-administered medications for this visit.    PHYSICAL EXAMINATION: ECOG PERFORMANCE STATUS: 0 - Asymptomatic  Vitals:   10/24/22 1017  BP: (!) 128/90  Pulse: 67  Resp: 18  Temp: 98.1 F (36.7 C)  SpO2: 100%   Wt Readings from Last 3 Encounters:  10/24/22 182 lb (82.6 kg)  05/31/22 176 lb 8 oz (80.1 kg)  04/14/22 170 lb 13.7 oz (77.5 kg)     GENERAL:alert, no distress and  comfortable SKIN: skin color normal, no rashes or significant lesions EYES: normal, Conjunctiva are pink and non-injected, sclera clear  NEURO: alert & oriented x 3 with fluent speech   LABORATORY DATA:  I have reviewed the data as listed    Latest Ref Rng & Units 10/24/2022    9:28 AM 05/31/2022    9:44 AM 04/14/2022    5:13 AM  CBC  WBC 4.0 - 10.5 K/uL 5.2  5.1  6.7   Hemoglobin 13.0 - 17.0 g/dL 16.1  09.6  04.5   Hematocrit 39.0 - 52.0 % 42.4  41.6  38.6   Platelets 150 - 400 K/uL 292  281  338         Latest Ref Rng & Units 10/24/2022    9:28 AM 05/31/2022    9:44 AM 04/14/2022    5:13 AM  CMP  Glucose 70 - 99 mg/dL 409  811  90   BUN 6 -  20 mg/dL 14  10  11    Creatinine 0.61 - 1.24 mg/dL 7.82  9.56  2.13   Sodium 135 - 145 mmol/L 138  141  137   Potassium 3.5 - 5.1 mmol/L 4.5  4.0  4.3   Chloride 98 - 111 mmol/L 106  107  107   CO2 22 - 32 mmol/L 28  28  26    Calcium 8.9 - 10.3 mg/dL 9.1  9.6  8.7   Total Protein 6.5 - 8.1 g/dL 7.0  6.6    Total Bilirubin 0.3 - 1.2 mg/dL 0.6  0.5    Alkaline Phos 38 - 126 U/L 62  69    AST 15 - 41 U/L 25  20    ALT 0 - 44 U/L 45  24        RADIOGRAPHIC STUDIES: I have personally reviewed the radiological images as listed and agreed with the findings in the report. No results found.    Orders Placed This Encounter  Procedures   CEA (IN HOUSE-[facility])   All questions were answered. The patient knows to call the clinic with any problems, questions or concerns. No barriers to learning was detected. The total time spent in the appointment was 20 minutes.     Jerry Mood, MD 10/24/2022   Carolin Coy, CMA, am acting as scribe for Jerry Mood, MD.   I have reviewed the above documentation for accuracy and completeness, and I agree with the above.

## 2022-10-24 ENCOUNTER — Inpatient Hospital Stay (HOSPITAL_BASED_OUTPATIENT_CLINIC_OR_DEPARTMENT_OTHER): Payer: BC Managed Care – PPO | Admitting: Hematology

## 2022-10-24 ENCOUNTER — Encounter: Payer: Self-pay | Admitting: Hematology

## 2022-10-24 ENCOUNTER — Inpatient Hospital Stay: Payer: BC Managed Care – PPO | Attending: Hematology

## 2022-10-24 VITALS — BP 128/90 | HR 67 | Temp 98.1°F | Resp 18 | Ht 72.0 in | Wt 182.0 lb

## 2022-10-24 DIAGNOSIS — C20 Malignant neoplasm of rectum: Secondary | ICD-10-CM

## 2022-10-24 DIAGNOSIS — Z79899 Other long term (current) drug therapy: Secondary | ICD-10-CM | POA: Diagnosis not present

## 2022-10-24 LAB — COMPREHENSIVE METABOLIC PANEL
ALT: 45 U/L — ABNORMAL HIGH (ref 0–44)
AST: 25 U/L (ref 15–41)
Albumin: 4.2 g/dL (ref 3.5–5.0)
Alkaline Phosphatase: 62 U/L (ref 38–126)
Anion gap: 4 — ABNORMAL LOW (ref 5–15)
BUN: 14 mg/dL (ref 6–20)
CO2: 28 mmol/L (ref 22–32)
Calcium: 9.1 mg/dL (ref 8.9–10.3)
Chloride: 106 mmol/L (ref 98–111)
Creatinine, Ser: 1.17 mg/dL (ref 0.61–1.24)
GFR, Estimated: >60 mL/min (ref >60)
Glucose, Bld: 111 mg/dL — ABNORMAL HIGH (ref 70–99)
Potassium: 4.5 mmol/L (ref 3.5–5.1)
Sodium: 138 mmol/L (ref 135–145)
Total Bilirubin: 0.6 mg/dL (ref 0.3–1.2)
Total Protein: 7 g/dL (ref 6.5–8.1)

## 2022-10-24 LAB — CBC WITH DIFFERENTIAL/PLATELET
Abs Immature Granulocytes: 0.01 10*3/uL (ref 0.00–0.07)
Basophils Absolute: 0 10*3/uL (ref 0.0–0.1)
Basophils Relative: 1 %
Eosinophils Absolute: 0.2 10*3/uL (ref 0.0–0.5)
Eosinophils Relative: 3 %
HCT: 42.4 % (ref 39.0–52.0)
Hemoglobin: 14.6 g/dL (ref 13.0–17.0)
Immature Granulocytes: 0 %
Lymphocytes Relative: 26 %
Lymphs Abs: 1.3 10*3/uL (ref 0.7–4.0)
MCH: 30.5 pg (ref 26.0–34.0)
MCHC: 34.4 g/dL (ref 30.0–36.0)
MCV: 88.7 fL (ref 80.0–100.0)
Monocytes Absolute: 0.7 10*3/uL (ref 0.1–1.0)
Monocytes Relative: 13 %
Neutro Abs: 3 10*3/uL (ref 1.7–7.7)
Neutrophils Relative %: 57 %
Platelets: 292 10*3/uL (ref 150–400)
RBC: 4.78 MIL/uL (ref 4.22–5.81)
RDW: 13.8 % (ref 11.5–15.5)
WBC: 5.2 10*3/uL (ref 4.0–10.5)
nRBC: 0 % (ref 0.0–0.2)

## 2022-10-24 LAB — CEA (IN HOUSE-CHCC): CEA (CHCC-In House): 1.79 ng/mL (ref 0.00–5.00)

## 2022-10-24 NOTE — Assessment & Plan Note (Signed)
-  uT3, cN0, cM0, stage II, ypT0N0, MSS  -presented with worsening hematochezia. Colonoscopy on 03/07/21 by Dr. Tomasa Rand showed a partially obstructing tumor in proximal rectum. Biopsy confirmed adenocarcinoma and a tubular adenoma polyp. -baseline CEA that day was normal at 1.7. -staging CT CAP 03/09/21 was negative for metastatic disease. -pelvic MRI 03/27/21 was also negative for nodal metastasis but was not able to give T stage  -he underwent EUS on 04/27/21 with Dr. Christella Hartigan showing: 4 cm rectal adenocarcinoma invading into and through the muscularis propria layer of rectal wall; 1.5 cm satellite nodule located 5-8 mm distal to tumor. This is staged as T3 -he began CAPOX on 05/10/21. He tolerated first cycle poorly with a lot of vomiting and fatigue. He tolerated cycle 2 better with Emend and reduced oxali dose. We held cycle 3 due to worsening transaminitis. Abdomen US on 06/29/21 was negative for metastatic disease, showed only mild steatosis. He completed 6 cycles on 09/21/21. -he received concurrent chemoRT with Xeloda 10/09/21 - 11/15/21. He tolerated relatively well overall, with expected GI side effects from radiation towards the end of treatment. -restaging CT CAP performed 11/14/21 showed decreased rectal thickening and no metastatic disease.  -s/p resection 12/1821 by Dr. Cliffton Asters, path showed complete pathological response, with no residual carcinoma. His postoperative course was complicated by recurrent obstructions. -CT from 10/09/2022 showed no evidence of recurrent or metastatic carcinoma. I reviewed with pt

## 2022-10-26 ENCOUNTER — Telehealth: Payer: Self-pay

## 2022-10-26 NOTE — Telephone Encounter (Signed)
Left Vm for patient to call and schedule screening colonoscopy.

## 2022-10-29 ENCOUNTER — Telehealth: Payer: Self-pay | Admitting: Gastroenterology

## 2022-10-29 NOTE — Telephone Encounter (Signed)
Dr. Barron Alvine,  Urgent referral in [facility] from [facility] for a colonoscopy.  Patient has a hx of rectal cancer.  Recall colon 12/2022. Records in EPIC.  Please review and advise urgency and scheduling?  Thanks [employer]

## 2022-12-05 ENCOUNTER — Ambulatory Visit: Payer: BC Managed Care – PPO | Admitting: *Deleted

## 2022-12-05 ENCOUNTER — Encounter: Payer: Self-pay | Admitting: Gastroenterology

## 2022-12-05 VITALS — Ht 72.0 in | Wt 185.0 lb

## 2022-12-05 DIAGNOSIS — C20 Malignant neoplasm of rectum: Secondary | ICD-10-CM

## 2022-12-05 MED ORDER — NA SULFATE-K SULFATE-MG SULF 17.5-3.13-1.6 GM/177ML PO SOLN
1.0000 | Freq: Once | ORAL | 0 refills | Status: AC
Start: 2022-12-05 — End: 2022-12-05

## 2022-12-05 NOTE — Progress Notes (Signed)

## 2022-12-13 ENCOUNTER — Telehealth: Payer: Self-pay | Admitting: Gastroenterology

## 2022-12-13 NOTE — Telephone Encounter (Signed)
Good Morning Dr. Tomasa Rand,   Patient called stating that he needed to cancel his colonoscopy with you tomorrow  at 1:30 due to believing he has COVID.   Patient was rescheduled for 8/19 at 2:00

## 2022-12-13 NOTE — Telephone Encounter (Signed)
Please see note.

## 2022-12-14 ENCOUNTER — Encounter: Payer: BC Managed Care – PPO | Admitting: Gastroenterology

## 2023-01-14 ENCOUNTER — Encounter: Payer: Self-pay | Admitting: Gastroenterology

## 2023-01-14 ENCOUNTER — Ambulatory Visit: Payer: BC Managed Care – PPO | Admitting: Gastroenterology

## 2023-01-14 VITALS — BP 118/77 | HR 64 | Temp 98.4°F | Resp 14 | Ht 72.0 in | Wt 185.0 lb

## 2023-01-14 DIAGNOSIS — Z08 Encounter for follow-up examination after completed treatment for malignant neoplasm: Secondary | ICD-10-CM

## 2023-01-14 DIAGNOSIS — D120 Benign neoplasm of cecum: Secondary | ICD-10-CM

## 2023-01-14 DIAGNOSIS — D122 Benign neoplasm of ascending colon: Secondary | ICD-10-CM

## 2023-01-14 DIAGNOSIS — K635 Polyp of colon: Secondary | ICD-10-CM | POA: Diagnosis not present

## 2023-01-14 DIAGNOSIS — Z85048 Personal history of other malignant neoplasm of rectum, rectosigmoid junction, and anus: Secondary | ICD-10-CM | POA: Diagnosis not present

## 2023-01-14 DIAGNOSIS — K639 Disease of intestine, unspecified: Secondary | ICD-10-CM

## 2023-01-14 DIAGNOSIS — K629 Disease of anus and rectum, unspecified: Secondary | ICD-10-CM

## 2023-01-14 DIAGNOSIS — C20 Malignant neoplasm of rectum: Secondary | ICD-10-CM

## 2023-01-14 MED ORDER — SODIUM CHLORIDE 0.9 % IV SOLN
500.0000 mL | Freq: Once | INTRAVENOUS | Status: DC
Start: 2023-01-14 — End: 2023-01-14

## 2023-01-14 NOTE — Patient Instructions (Signed)

## 2023-01-14 NOTE — Progress Notes (Signed)
Called to room to assist during endoscopic procedure.  Patient ID and intended procedure confirmed with present staff. Received instructions for my participation in the procedure from the performing physician.  

## 2023-01-14 NOTE — Progress Notes (Signed)
Pt's states no medical or surgical changes since previsit or office visit. 

## 2023-01-14 NOTE — Progress Notes (Signed)
[facility] History and Physical   Primary Care Physician:  Kandyce Rud, MD   Reason for Procedure:   History of rectal cancer  Plan:    Colonoscopy     HPI: Jerry Allison is a 47 y.o. male undergoing surveillance colonoscopy after being diagnosed with rectal cancer in Oct 2022.  He was subsequently treated with chemotherapy and radiation, completing treatment June 2023.  He developed a severe rectal stricture and subsequently underwent a low anterior resection with diverting loop ileostomcy in Aug 2023.  Pathology negative for malignancy.      Past Medical History:  Diagnosis Date   Anxiety    Depression    Family history of bladder cancer    GERD (gastroesophageal reflux disease)    Rectal cancer (HCC) 02/2021    Past Surgical History:  Procedure Laterality Date   COLONOSCOPY     EUS N/A 04/27/2021   Procedure: LOWER ENDOSCOPIC ULTRASOUND (EUS);  Surgeon: Rachael Fee, MD;  Location: [facility];  Service: Endoscopy;  Laterality: N/A;   FLEXIBLE SIGMOIDOSCOPY N/A 04/27/2021   Procedure: FLEXIBLE SIGMOIDOSCOPY;  Surgeon: Rachael Fee, MD;  Location: [facility];  Service: Endoscopy;  Laterality: N/A;   FLEXIBLE SIGMOIDOSCOPY N/A 01/12/2022   Procedure: FLEXIBLE SIGMOIDOSCOPY;  Surgeon: Andria Meuse, MD;  Location: [facility];  Service: General;  Laterality: N/A;   FLEXIBLE SIGMOIDOSCOPY N/A 03/07/2022   Procedure: FLEXIBLE SIGMOIDOSCOPY;  Surgeon: Andria Meuse, MD;  Location: [facility];  Service: General;  Laterality: N/A;   ILEO LOOP DIVERSION N/A 01/12/2022   Procedure: DIVERTING LOOP ILEOSTOMY;  Surgeon: Andria Meuse, MD;  Location: [facility];  Service: General;  Laterality: N/A;   ILEOSTOMY CLOSURE N/A 04/04/2022   Procedure: ILEOSTOMY TAKEDOWN;  Surgeon: Andria Meuse, MD;  Location: [facility];  Service: General;  Laterality: N/A;   NO PAST SURGERIES     XI ROBOTIC ASSISTED LOWER ANTERIOR RESECTION N/A 01/12/2022    Procedure: XI ROBOTIC ASSISTED LOWER ANTERIOR RESECTION;  Surgeon: Andria Meuse, MD;  Location: [facility];  Service: General;  Laterality: N/A;    Prior to Admission medications   Medication Sig Start Date End Date Taking? Authorizing Provider  acetaminophen (TYLENOL) 500 MG tablet Take 1,000 mg by mouth every 6 (six) hours as needed for moderate pain (pain.).   Yes [provider]  escitalopram (LEXAPRO) 10 MG tablet Take 10 mg by mouth at bedtime. 11/14/20  Yes [provider]  oxymetazoline (AFRIN) 0.05 % nasal spray Place 1 spray into both nostrils daily as needed for congestion.   Yes [provider]  triamcinolone (NASACORT ALLERGY 24HR) 55 MCG/ACT AERO nasal inhaler Place 1 spray into the nose daily as needed (allergies).   Yes [provider]  Ensure (ENSURE) Take 237 mLs by mouth daily. Patient not taking: Reported on 12/05/2022    [provider]  gabapentin (NEURONTIN) 100 MG capsule Take 1 capsule (100 mg total) by mouth at bedtime. Ok to increase t0 3 capsules at night if needed and tolerates well Patient taking differently: Take 100-200 mg by mouth at bedtime as needed (nerve pain). 12/14/21   Malachy Mood, MD  ibuprofen (ADVIL) 200 MG tablet Take 400 mg by mouth daily as needed (pain.).    [provider]  omeprazole (PRILOSEC) 20 MG capsule Take 1 capsule (20 mg total) by mouth daily. Patient taking differently: Take 20 mg by mouth daily as needed (acid reflux). 09/21/21   Malachy Mood, MD    Current  Outpatient Medications  Medication Sig Dispense Refill   acetaminophen (TYLENOL) 500 MG tablet Take 1,000 mg by mouth every 6 (six) hours as needed for moderate pain (pain.).     escitalopram (LEXAPRO) 10 MG tablet Take 10 mg by mouth at bedtime.     oxymetazoline (AFRIN) 0.05 % nasal spray Place 1 spray into both nostrils daily as needed for congestion.     triamcinolone (NASACORT ALLERGY 24HR) 55 MCG/ACT AERO nasal inhaler Place 1  spray into the nose daily as needed (allergies).     Ensure (ENSURE) Take 237 mLs by mouth daily. (Patient not taking: Reported on 12/05/2022)     gabapentin (NEURONTIN) 100 MG capsule Take 1 capsule (100 mg total) by mouth at bedtime. Ok to increase t0 3 capsules at night if needed and tolerates well (Patient taking differently: Take 100-200 mg by mouth at bedtime as needed (nerve pain).) 90 capsule 0   ibuprofen (ADVIL) 200 MG tablet Take 400 mg by mouth daily as needed (pain.).     omeprazole (PRILOSEC) 20 MG capsule Take 1 capsule (20 mg total) by mouth daily. (Patient taking differently: Take 20 mg by mouth daily as needed (acid reflux).) 30 capsule 0   Current Facility-Administered Medications  Medication Dose Route Frequency Provider Last Rate Last Admin   0.9 %  sodium chloride infusion  500 mL Intravenous Once Jenel Lucks, MD        Allergies as of 01/14/2023 - Review Complete 01/14/2023  Allergen Reaction Noted   Shellfish allergy Anaphylaxis 01/09/2021    Family History  Problem Relation Age of Onset   Anxiety disorder Father    Healthy Brother    Bladder Cancer Maternal Grandmother 41   Healthy Half-Sister        paternal half   Colon cancer Neg Hx    Esophageal cancer Neg Hx    Stomach cancer Neg Hx    Pancreatic cancer Neg Hx    Liver disease Neg Hx    Inflammatory bowel disease Neg Hx     Social History   Socioeconomic History   Marital status: Married    Spouse name: Not on file   Number of children: 3   Years of education: Not on file   Highest education level: Not on file  Occupational History   Not on file  Tobacco Use   Smoking status: Never   Smokeless tobacco: Never  Vaping Use   Vaping status: Some Days   Substances: THC  Substance and Sexual Activity   Alcohol use: Yes    Comment: social-weekly   Drug use: Yes    Types: Marijuana    Comment: occ   Sexual activity: Not on file  Other Topics Concern   Not on file  Social History  Narrative   Not on file   Social Determinants of Health   Financial Resource Strain: Not on file  Food Insecurity: No Food Insecurity (04/13/2022)   Hunger Vital Sign    Worried About Running Out of Food in the Last Year: Never true    Ran Out of Food in the Last Year: Never true  Transportation Needs: No Transportation Needs (04/13/2022)   PRAPARE - Administrator, Civil Service (Medical): No    Lack of Transportation (Non-Medical): No  Physical Activity: Not on file  Stress: Not on file  Social Connections: Not on file  Intimate Partner Violence: Not At Risk (04/13/2022)   Humiliation, Afraid, Rape, and Kick questionnaire  Fear of Current or Ex-Partner: No    Emotionally Abused: No    Physically Abused: No    Sexually Abused: No    Review of Systems:  All other review of systems negative except as mentioned in the HPI.  Physical Exam: Vital signs BP 115/67   Pulse 81   Temp 98.4 F (36.9 C) (Temporal)   Ht 6' (1.829 m)   Wt 185 lb (83.9 kg)   SpO2 98%   BMI 25.09 kg/m   General:   Alert,  Well-developed, well-nourished, pleasant and cooperative in NAD Airway:  Mallampati 2 Lungs:  Clear throughout to auscultation.   Heart:  Regular rate and rhythm; no murmurs, clicks, rubs,  or gallops. Abdomen:  Soft, nontender and nondistended. Normal bowel sounds.   Neuro/Psych:  Normal mood and affect. A and O x 3   Kaushal Vannice E. Tomasa Rand, MD [facility]

## 2023-01-14 NOTE — Op Note (Signed)
[facility] Patient Name: Jerry Allison Procedure Date: 01/14/2023 2:02 PM MRN: [id number] Endoscopist: Lorin Picket E. Tomasa Rand , MD, [id number] Age: 47 Referring MD:  Date of Birth: 01/13/1976 Gender: Male Account #: [id number] Procedure:                Colonoscopy Indications:              High risk colon cancer surveillance: Personal                            history of rectal cancer (diagnosed Oct 2022, s/p                            chemo/XRT completed June 2023 complicated by severe                            stricture, s/p LAR (negative for malignancy) with                            ileostomy and subsequent takedown in Nov 2023) Medicines:                Monitored Anesthesia Care Procedure:                Pre-Anesthesia Assessment:                           - Prior to the procedure, a History and Physical                            was performed, and patient medications and                            allergies were reviewed. The patient's tolerance of                            previous anesthesia was also reviewed. The risks                            and benefits of the procedure and the sedation                            options and risks were discussed with the patient.                            All questions were answered, and informed consent                            was obtained. Prior Anticoagulants: The patient has                            taken no anticoagulant or antiplatelet agents. ASA                            Grade Assessment: III - A patient with severe  systemic disease. After reviewing the risks and                            benefits, the patient was deemed in satisfactory                            condition to undergo the procedure.                           After obtaining informed consent, the colonoscope                            was passed under direct vision. Throughout the                             procedure, the patient's blood pressure, pulse, and                            oxygen saturations were monitored continuously. The                            Olympus Scope SN: [id number] was introduced through                            the anus and advanced to the the terminal ileum,                            with identification of the appendiceal orifice and                            IC valve. The colonoscopy was performed without                            difficulty. The patient tolerated the procedure                            well. The quality of the bowel preparation was                            adequate. The terminal ileum, ileocecal valve,                            appendiceal orifice, and rectum were photographed.                            The bowel preparation used was SUPREP via split                            dose instruction. Scope In: 2:09:12 PM Scope Out: 2:26:54 PM Scope Withdrawal Time: 0 hours 14 minutes 57 seconds  Total Procedure Duration: 0 hours 17 minutes 42 seconds  Findings:                 The perianal and digital rectal examinations were  normal. Pertinent negatives include normal                            sphincter tone and no palpable rectal lesions.                           A 12 mm polyp was found in the cecum. The polyp was                            flat. The polyp was removed with a cold snare.                            Resection and retrieval were complete. Estimated                            blood loss was minimal.                           A 3 mm polyp was found in the ascending colon. The                            polyp was sessile. The polyp was removed with a                            cold snare. Resection and retrieval were complete.                            Estimated blood loss was minimal.                           There was evidence of a prior end-to-end                            colo-rectal anastomosis in  the proximal rectum.                            This was patent and was characterized by healthy                            appearing mucosa. The anastomosis was traversed.                            Biopsies were taken with a cold forceps for                            histology. Estimated blood loss was minimal.                           The exam was otherwise normal throughout the                            examined colon.  The terminal ileum appeared normal.                           The retroflexed view of the distal rectum and anal                            verge was normal and showed no anal or rectal                            abnormalities. Complications:            No immediate complications. Estimated Blood Loss:     Estimated blood loss was minimal. Impression:               - One 12 mm polyp in the cecum, removed with a cold                            snare. Resected and retrieved.                           - One 3 mm polyp in the ascending colon, removed                            with a cold snare. Resected and retrieved.                           - Patent end-to-end colo-rectal anastomosis,                            characterized by healthy appearing mucosa. Biopsied.                           - The examined portion of the ileum was normal.                           - The distal rectum and anal verge are normal on                            retroflexion view. Recommendation:           - Patient has a contact number available for                            emergencies. The signs and symptoms of potential                            delayed complications were discussed with the                            patient. Return to normal activities tomorrow.                            Written discharge instructions were provided to the  patient.                           - Resume previous diet.                           - Continue  present medications.                           - Await pathology results.                           - Repeat colonoscopy (date not yet determined) for                            surveillance based on pathology results. Aldea Avis E. Tomasa Rand, MD 01/14/2023 2:36:02 PM This report has been signed electronically.

## 2023-01-14 NOTE — Progress Notes (Signed)
To pacu, VSS. Report to [email or web address] 

## 2023-01-15 ENCOUNTER — Telehealth: Payer: Self-pay | Admitting: *Deleted

## 2023-01-15 NOTE — Telephone Encounter (Signed)
  Follow up Call-     01/14/2023    1:17 PM 01/02/2022    9:13 AM 03/07/2021   10:46 AM  Call back number  Post procedure Call Back phone  # [phone]  Permission to leave phone message Yes Yes Yes     Patient questions:  Do you have a fever, pain , or abdominal swelling? No. Pain Score  0 *  Have you tolerated food without any problems? Yes.    Have you been able to return to your normal activities? Yes.    Do you have any questions about your discharge instructions: Diet   No. Medications  No. Follow up visit  No.  Do you have questions or concerns about your Care? No.  Actions: * If pain score is 4 or above: No action needed, pain <4.

## 2023-01-19 NOTE — Progress Notes (Signed)
Jerry Allison,   The two polyps that I removed during your recent procedure were completely benign but were proven to be "pre-cancerous" polyps that MAY have grown into cancers if they had not been removed.  Studies shows that at least 20% of women over age 47 and 30% of men over age 76 have pre-cancerous polyps.  Based on your history of rectal cancer and the size of the larger polyp, I would recommend you repeat a colonoscopy in 2 years.  The biopsies of the mucosa at your anastomosis were unremarkable, with no evidence of recurrent cancer.    If you develop any new rectal bleeding, abdominal pain or significant bowel habit changes, please contact me before then.

## 2023-02-15 ENCOUNTER — Other Ambulatory Visit: Payer: Self-pay

## 2023-02-15 DIAGNOSIS — C20 Malignant neoplasm of rectum: Secondary | ICD-10-CM

## 2023-02-17 ENCOUNTER — Other Ambulatory Visit: Payer: Self-pay | Admitting: Nurse Practitioner

## 2023-02-17 DIAGNOSIS — C20 Malignant neoplasm of rectum: Secondary | ICD-10-CM

## 2023-02-17 NOTE — Progress Notes (Deleted)
Patient Care Team: Kandyce Rud, MD as PCP - General (Family Medicine) Jenel Lucks, MD as Consulting Physician (Gastroenterology) Malachy Mood, MD as Consulting Physician (Hematology) Marin Olp (Inactive) Dorothy Puffer, MD as Consulting Physician (Radiation Oncology) Durenda Hurt, MD as Referring Physician (Surgical Oncology)   CHIEF COMPLAINT: Follow up rectal cancer   Oncology History Overview Note   Cancer Staging  Adenocarcinoma of rectum [facility]) Staging form: Colon and Rectum - Neuroendocine Tumors, AJCC 8th Edition - Clinical stage from 03/29/2021: Stage IIB (cT3, cN0, cM0) - Signed by Malachy Mood, MD on 06/20/2021    Adenocarcinoma of rectum (HCC)  03/07/2021 Procedure   Colonoscopy, Dr. Tomasa Rand  Impression - Malignant appearing partially obstructing tumor in the proximal rectum. Biopsied. Tattooed. - One 3 mm polyp in the distal rectum, removed with a cold snare. Resected and retrieved. - The examined portion of the ileum was normal. - The distal rectum and anal verge are normal on retroflexion view.   03/07/2021 Pathology Results   Diagnosis 1. Rectum, biopsy, mass - ADENOCARCINOMA - SEE COMMENT 2. Rectum, biopsy, polyp (1) - TUBULAR ADENOMA (1 OF 1 FRAGMENTS) - NO HIGH-GRADE DYSPLASIA OR MALIGNANCY IDENTIFIED   03/09/2021 Imaging   CT CAP  IMPRESSION: 1. Asymmetric wall thickening in the proximal/mid rectum may correspond to mass seen on endoscopy. 2. Negative for adenopathy or evidence of distal metastatic disease.   03/27/2021 Imaging   MRI Pelvis  IMPRESSION: 1. Low sigmoid primary, greater than 15 cm from the anal verge, as detailed above. Poorly delineated secondary to underdistention in this region. 2. No evidence of pelvic nodal metastasis. 3. Trace perisigmoid fluid, new since the prior CT.   03/28/2021 Initial Diagnosis   Adenocarcinoma of rectum (HCC)   03/29/2021 Cancer Staging   Staging form: Colon and Rectum -  Neuroendocine Tumors, AJCC 8th Edition - Clinical stage from 03/29/2021: Stage IIB (cT3, cN0, cM0) - Signed by Malachy Mood, MD on 06/20/2021 Stage prefix: Initial diagnosis Histologic grade (G): G2 Histologic grading system: 3 grade system   04/12/2021 Genetic Testing   Negative genetic testing on the CancerNext-Expanded+RNAinsight panel.  The report date is 04/12/2021.  The CancerNext-Expanded gene panel offered by [facility] and includes sequencing and rearrangement analysis for the following 77 genes: AIP, ALK, APC*, ATM*, AXIN2, BAP1, BARD1, BLM, BMPR1A, BRCA1*, BRCA2*, BRIP1*, CDC73, CDH1*, CDK4, CDKN1B, CDKN2A, CHEK2*, CTNNA1, DICER1, FANCC, FH, FLCN, GALNT12, KIF1B, LZTR1, MAX, MEN1, MET, MLH1*, MSH2*, MSH3, MSH6*, MUTYH*, NBN, NF1*, NF2, NTHL1, PALB2*, PHOX2B, PMS2*, POT1, PRKAR1A, PTCH1, PTEN*, RAD51C*, RAD51D*, RB1, RECQL, RET, SDHA, SDHAF2, SDHB, SDHC, SDHD, SMAD4, SMARCA4, SMARCB1, SMARCE1, STK11, SUFU, TMEM127, TP53*, TSC1, TSC2, VHL and XRCC2 (sequencing and deletion/duplication); EGFR, EGLN1, HOXB13, KIT, MITF, PDGFRA, POLD1, and POLE (sequencing only); EPCAM and GREM1 (deletion/duplication only). DNA and RNA analyses performed for * genes.    04/27/2021 Procedure   EUS, Dr. Christella Hartigan  Impression: - 4cm long, circumferential, at least uT3Nx rectal adenocarcinoma with distal edge located 10cm from the anal verge. 1.5cm satellite nodule located 5-69mm distal to the tumor (around 9cm from the anal verge).   05/10/2021 - 10/04/2021 Chemotherapy   Patient is on Treatment Plan : RECTAL Xelox (Capeox) q21d x 6 cycles      01/12/2022 Definitive Surgery   FINAL MICROSCOPIC DIAGNOSIS:   A. COLON, RECTOSIGMOID, RESECTION:  - Fibrosis and mild chronic inflammation consistent with treatment  effect.  - No residual carcinoma (ypT0).  - All surgical margins negative for tumor.  - Eighteen lymph nodes  negative for metastatic carcinoma (0/18)(ypN0).  - See oncology table.       CURRENT  THERAPY: Surveillance  INTERVAL HISTORY Jerry Allison returns for follow up as scheduled. Last seen by Dr. Mosetta Putt 10/17/22. Colonoscopy 01/14/23 by Dr. Tomasa Rand was benign, on 2 year recall.   ROS   Past Medical History:  Diagnosis Date   Anxiety    Depression    Family history of bladder cancer    GERD (gastroesophageal reflux disease)    Rectal cancer (HCC) 02/2021     Past Surgical History:  Procedure Laterality Date   COLONOSCOPY     EUS N/A 04/27/2021   Procedure: LOWER ENDOSCOPIC ULTRASOUND (EUS);  Surgeon: Rachael Fee, MD;  Location: [facility];  Service: Endoscopy;  Laterality: N/A;   FLEXIBLE SIGMOIDOSCOPY N/A 04/27/2021   Procedure: FLEXIBLE SIGMOIDOSCOPY;  Surgeon: Rachael Fee, MD;  Location: [facility];  Service: Endoscopy;  Laterality: N/A;   FLEXIBLE SIGMOIDOSCOPY N/A 01/12/2022   Procedure: FLEXIBLE SIGMOIDOSCOPY;  Surgeon: Andria Meuse, MD;  Location: [facility];  Service: General;  Laterality: N/A;   FLEXIBLE SIGMOIDOSCOPY N/A 03/07/2022   Procedure: FLEXIBLE SIGMOIDOSCOPY;  Surgeon: Andria Meuse, MD;  Location: [facility];  Service: General;  Laterality: N/A;   ILEO LOOP DIVERSION N/A 01/12/2022   Procedure: DIVERTING LOOP ILEOSTOMY;  Surgeon: Andria Meuse, MD;  Location: [facility];  Service: General;  Laterality: N/A;   ILEOSTOMY CLOSURE N/A 04/04/2022   Procedure: ILEOSTOMY TAKEDOWN;  Surgeon: Andria Meuse, MD;  Location: [facility];  Service: General;  Laterality: N/A;   NO PAST SURGERIES     XI ROBOTIC ASSISTED LOWER ANTERIOR RESECTION N/A 01/12/2022   Procedure: XI ROBOTIC ASSISTED LOWER ANTERIOR RESECTION;  Surgeon: Andria Meuse, MD;  Location: [facility];  Service: General;  Laterality: N/A;     Outpatient Encounter Medications as of 02/18/2023  Medication Sig   acetaminophen (TYLENOL) 500 MG tablet Take 1,000 mg by mouth every 6 (six) hours as needed for moderate pain (pain.).   Ensure (ENSURE) Take 237 mLs by mouth  daily. (Patient not taking: Reported on 12/05/2022)   escitalopram (LEXAPRO) 10 MG tablet Take 10 mg by mouth at bedtime.   gabapentin (NEURONTIN) 100 MG capsule Take 1 capsule (100 mg total) by mouth at bedtime. Ok to increase t0 3 capsules at night if needed and tolerates well (Patient taking differently: Take 100-200 mg by mouth at bedtime as needed (nerve pain).)   ibuprofen (ADVIL) 200 MG tablet Take 400 mg by mouth daily as needed (pain.).   omeprazole (PRILOSEC) 20 MG capsule Take 1 capsule (20 mg total) by mouth daily. (Patient taking differently: Take 20 mg by mouth daily as needed (acid reflux).)   oxymetazoline (AFRIN) 0.05 % nasal spray Place 1 spray into both nostrils daily as needed for congestion.   triamcinolone (NASACORT ALLERGY 24HR) 55 MCG/ACT AERO nasal inhaler Place 1 spray into the nose daily as needed (allergies).   No facility-administered encounter medications on file as of 02/18/2023.     There were no vitals filed for this visit. There is no height or weight on file to calculate BMI.   PHYSICAL EXAM GENERAL:alert, no distress and comfortable SKIN: no rash  EYES: sclera clear NECK: without mass LYMPH:  no palpable cervical or supraclavicular lymphadenopathy  LUNGS: clear with normal breathing effort HEART: regular rate & rhythm, no lower extremity edema ABDOMEN: abdomen soft, non-tender and normal bowel sounds NEURO: alert & oriented x 3 with fluent speech,  no focal motor/sensory deficits Breast exam:  PAC without erythema    CBC    Component Value Date/Time   WBC 5.2 10/24/2022 0928   RBC 4.78 10/24/2022 0928   HGB 14.6 10/24/2022 0928   HGB 14.0 05/31/2022 0944   HCT 42.4 10/24/2022 0928   PLT 292 10/24/2022 0928   PLT 281 05/31/2022 0944   MCV 88.7 10/24/2022 0928   MCH 30.5 10/24/2022 0928   MCHC 34.4 10/24/2022 0928   RDW 13.8 10/24/2022 0928   LYMPHSABS 1.3 10/24/2022 0928   MONOABS 0.7 10/24/2022 0928   EOSABS 0.2 10/24/2022 0928   BASOSABS  0.0 10/24/2022 0928     CMP     Component Value Date/Time   NA 138 10/24/2022 0928   K 4.5 10/24/2022 0928   CL 106 10/24/2022 0928   CO2 28 10/24/2022 0928   GLUCOSE 111 (H) 10/24/2022 0928   BUN 14 10/24/2022 0928   CREATININE 1.17 10/24/2022 0928   CREATININE 1.12 05/31/2022 0944   CALCIUM 9.1 10/24/2022 0928   PROT 7.0 10/24/2022 0928   ALBUMIN 4.2 10/24/2022 0928   AST 25 10/24/2022 0928   AST 20 05/31/2022 0944   ALT 45 (H) 10/24/2022 0928   ALT 24 05/31/2022 0944   ALKPHOS 62 10/24/2022 0928   BILITOT 0.6 10/24/2022 0928   BILITOT 0.5 05/31/2022 0944   GFRNONAA >60 10/24/2022 0928   GFRNONAA >60 05/31/2022 0944     ASSESSMENT & PLAN:Blue Schlepp is a 47 y.o. male with    1. 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 Colonoscopy on 03/07/21 showed a partially-obstructing tumor in proximal rectum. Biopsy confirmed adenocarcinoma and a tubular adenoma polyp. -baseline CEA that normal at 1.7. -pelvic MRI 03/27/21 was also negative for nodal metastasis but was not able to give T stage  -he underwent EUS on 04/27/21 with Dr. Christella Hartigan showing: 4 cm rectal adenocarcinoma invading into and through the muscularis propria layer of rectal wall; 1.5 cm satellite nodule located 5-8 mm distal to tumor. This is staged as T3 -he completed total neoadjuvant treatment with CAPOX 05/10/21 - 09/21/21 (x6 cycles) followed by ccRT 10/09/21 - 11/15/21 -S/p resection by Dr. Cliffton Asters 01/12/22, path showed no residual carcinoma. Post op course complicated by recurrent obstructions. He has recovered well.      3. Iron deficient anemia -work up on 03/29/21 showed: iron 23, ferritin 4, hgb 11.1.  This is secondary to rectal bleeding from the tumor. -he received IV iron on 05/15/21. He reports he had terrible constipation following the infusion. -It was recommended to take oral iron or prenatal vitamin. -ferritin improved, 60 on 10/17/21; no  need for additional IV iron at this time  PLAN:  No orders of the defined types were placed in this encounter.     All questions were answered. The patient knows to call the clinic with any problems, questions or concerns. No barriers to learning were detected. I spent *** counseling the patient face to face. The total time spent in the appointment was *** and more than 50% was on counseling, review of test results, and coordination of care.   Santiago Glad, NP-C @DATE @

## 2023-02-18 ENCOUNTER — Inpatient Hospital Stay: Payer: BC Managed Care – PPO | Attending: Nurse Practitioner

## 2023-02-18 ENCOUNTER — Inpatient Hospital Stay: Payer: BC Managed Care – PPO | Admitting: Nurse Practitioner

## 2023-03-04 ENCOUNTER — Telehealth: Payer: Self-pay | Admitting: Nurse Practitioner

## 2023-03-05 ENCOUNTER — Encounter: Payer: Self-pay | Admitting: Nurse Practitioner

## 2023-03-05 ENCOUNTER — Other Ambulatory Visit: Payer: BC Managed Care – PPO | Attending: Nurse Practitioner

## 2023-03-05 ENCOUNTER — Inpatient Hospital Stay (HOSPITAL_BASED_OUTPATIENT_CLINIC_OR_DEPARTMENT_OTHER): Payer: BC Managed Care – PPO | Admitting: Nurse Practitioner

## 2023-03-05 VITALS — BP 139/100 | HR 59 | Temp 98.1°F | Resp 17 | Ht 72.0 in | Wt 187.5 lb

## 2023-03-05 DIAGNOSIS — C20 Malignant neoplasm of rectum: Secondary | ICD-10-CM

## 2023-03-05 LAB — CBC WITH DIFFERENTIAL (CANCER CENTER ONLY)
Abs Immature Granulocytes: 0.02 10*3/uL (ref 0.00–0.07)
Basophils Absolute: 0 10*3/uL (ref 0.0–0.1)
Basophils Relative: 1 %
Eosinophils Absolute: 0.1 10*3/uL (ref 0.0–0.5)
Eosinophils Relative: 3 %
HCT: 43 % (ref 39.0–52.0)
Hemoglobin: 15 g/dL (ref 13.0–17.0)
Immature Granulocytes: 1 %
Lymphocytes Relative: 25 %
Lymphs Abs: 1.1 10*3/uL (ref 0.7–4.0)
MCH: 30.7 pg (ref 26.0–34.0)
MCHC: 34.9 g/dL (ref 30.0–36.0)
MCV: 87.9 fL (ref 80.0–100.0)
Monocytes Absolute: 0.6 10*3/uL (ref 0.1–1.0)
Monocytes Relative: 14 %
Neutro Abs: 2.6 10*3/uL (ref 1.7–7.7)
Neutrophils Relative %: 56 %
Platelet Count: 278 10*3/uL (ref 150–400)
RBC: 4.89 MIL/uL (ref 4.22–5.81)
RDW: 12.7 % (ref 11.5–15.5)
WBC Count: 4.4 10*3/uL (ref 4.0–10.5)
nRBC: 0 % (ref 0.0–0.2)

## 2023-03-05 LAB — CMP (CANCER CENTER ONLY)
ALT: 30 U/L (ref 0–44)
AST: 23 U/L (ref 15–41)
Albumin: 4.4 g/dL (ref 3.5–5.0)
Alkaline Phosphatase: 56 U/L (ref 38–126)
Anion gap: 5 (ref 5–15)
BUN: 14 mg/dL (ref 6–20)
CO2: 29 mmol/L (ref 22–32)
Calcium: 9.8 mg/dL (ref 8.9–10.3)
Chloride: 104 mmol/L (ref 98–111)
Creatinine: 1.18 mg/dL (ref 0.61–1.24)
GFR, Estimated: >60 mL/min (ref >60)
Glucose, Bld: 96 mg/dL (ref 70–99)
Potassium: 4.3 mmol/L (ref 3.5–5.1)
Sodium: 138 mmol/L (ref 135–145)
Total Bilirubin: 0.5 mg/dL (ref 0.3–1.2)
Total Protein: 7.3 g/dL (ref 6.5–8.1)

## 2023-03-05 LAB — CEA (ACCESS): CEA (CHCC): 1.62 ng/mL (ref 0.00–5.00)

## 2023-03-05 NOTE — Progress Notes (Signed)
Patient Care Team: Kandyce Rud, MD as PCP - General (Family Medicine) Jenel Lucks, MD as Consulting Physician (Gastroenterology) Malachy Mood, MD as Consulting Physician (Hematology) Marin Olp (Inactive) Dorothy Puffer, MD as Consulting Physician (Radiation Oncology) Durenda Hurt, MD as Referring Physician (Surgical Oncology)   CHIEF COMPLAINT: Follow up rectal cancer   Oncology History Overview Note   Cancer Staging  Adenocarcinoma of rectum [facility]) Staging form: Colon and Rectum - Neuroendocine Tumors, AJCC 8th Edition - Clinical stage from 03/29/2021: Stage IIB (cT3, cN0, cM0) - Signed by Malachy Mood, MD on 06/20/2021    Adenocarcinoma of rectum (HCC)  03/07/2021 Procedure   Colonoscopy, Dr. Tomasa Rand  Impression - Malignant appearing partially obstructing tumor in the proximal rectum. Biopsied. Tattooed. - One 3 mm polyp in the distal rectum, removed with a cold snare. Resected and retrieved. - The examined portion of the ileum was normal. - The distal rectum and anal verge are normal on retroflexion view.   03/07/2021 Pathology Results   Diagnosis 1. Rectum, biopsy, mass - ADENOCARCINOMA - SEE COMMENT 2. Rectum, biopsy, polyp (1) - TUBULAR ADENOMA (1 OF 1 FRAGMENTS) - NO HIGH-GRADE DYSPLASIA OR MALIGNANCY IDENTIFIED   03/09/2021 Imaging   CT CAP  IMPRESSION: 1. Asymmetric wall thickening in the proximal/mid rectum may correspond to mass seen on endoscopy. 2. Negative for adenopathy or evidence of distal metastatic disease.   03/27/2021 Imaging   MRI Pelvis  IMPRESSION: 1. Low sigmoid primary, greater than 15 cm from the anal verge, as detailed above. Poorly delineated secondary to underdistention in this region. 2. No evidence of pelvic nodal metastasis. 3. Trace perisigmoid fluid, new since the prior CT.   03/28/2021 Initial Diagnosis   Adenocarcinoma of rectum (HCC)   03/29/2021 Cancer Staging   Staging form: Colon and Rectum -  Neuroendocine Tumors, AJCC 8th Edition - Clinical stage from 03/29/2021: Stage IIB (cT3, cN0, cM0) - Signed by Malachy Mood, MD on 06/20/2021 Stage prefix: Initial diagnosis Histologic grade (G): G2 Histologic grading system: 3 grade system   04/12/2021 Genetic Testing   Negative genetic testing on the CancerNext-Expanded+RNAinsight panel.  The report date is 04/12/2021.  The CancerNext-Expanded gene panel offered by [facility] and includes sequencing and rearrangement analysis for the following 77 genes: AIP, ALK, APC*, ATM*, AXIN2, BAP1, BARD1, BLM, BMPR1A, BRCA1*, BRCA2*, BRIP1*, CDC73, CDH1*, CDK4, CDKN1B, CDKN2A, CHEK2*, CTNNA1, DICER1, FANCC, FH, FLCN, GALNT12, KIF1B, LZTR1, MAX, MEN1, MET, MLH1*, MSH2*, MSH3, MSH6*, MUTYH*, NBN, NF1*, NF2, NTHL1, PALB2*, PHOX2B, PMS2*, POT1, PRKAR1A, PTCH1, PTEN*, RAD51C*, RAD51D*, RB1, RECQL, RET, SDHA, SDHAF2, SDHB, SDHC, SDHD, SMAD4, SMARCA4, SMARCB1, SMARCE1, STK11, SUFU, TMEM127, TP53*, TSC1, TSC2, VHL and XRCC2 (sequencing and deletion/duplication); EGFR, EGLN1, HOXB13, KIT, MITF, PDGFRA, POLD1, and POLE (sequencing only); EPCAM and GREM1 (deletion/duplication only). DNA and RNA analyses performed for * genes.    04/27/2021 Procedure   EUS, Dr. Christella Hartigan  Impression: - 4cm long, circumferential, at least uT3Nx rectal adenocarcinoma with distal edge located 10cm from the anal verge. 1.5cm satellite nodule located 5-40mm distal to the tumor (around 9cm from the anal verge).   05/10/2021 - 10/04/2021 Chemotherapy   Patient is on Treatment Plan : RECTAL Xelox (Capeox) q21d x 6 cycles      01/12/2022 Definitive Surgery   FINAL MICROSCOPIC DIAGNOSIS:   A. COLON, RECTOSIGMOID, RESECTION:  - Fibrosis and mild chronic inflammation consistent with treatment  effect.  - No residual carcinoma (ypT0).  - All surgical margins negative for tumor.  - Eighteen lymph nodes  negative for metastatic carcinoma (0/18)(ypN0).  - See oncology table.       CURRENT  THERAPY: Surveillance  INTERVAL HISTORY Jerry Allison returns for follow up as scheduled. Last seen by Dr. Mosetta Putt 10/17/22. Colonoscopy 01/14/23 by Dr. Tomasa Rand was benign, on 2 year recall.  Bowel movements remain erratic since surgery.  Denies black or bloody stools.  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  Energy and appetite have improved, denies unintentional weight loss, abdominal pain/bloating, nausea/vomiting, or any other new or specific complaints.  ROS  All other systems reviewed and negative  Past Medical History:  Diagnosis Date   Anxiety    Depression    Family history of bladder cancer    GERD (gastroesophageal reflux disease)    Rectal cancer (HCC) 02/2021     Past Surgical History:  Procedure Laterality Date   COLONOSCOPY     EUS N/A 04/27/2021   Procedure: LOWER ENDOSCOPIC ULTRASOUND (EUS);  Surgeon: Rachael Fee, MD;  Location: [facility];  Service: Endoscopy;  Laterality: N/A;   FLEXIBLE SIGMOIDOSCOPY N/A 04/27/2021   Procedure: FLEXIBLE SIGMOIDOSCOPY;  Surgeon: Rachael Fee, MD;  Location: [facility];  Service: Endoscopy;  Laterality: N/A;   FLEXIBLE SIGMOIDOSCOPY N/A 01/12/2022   Procedure: FLEXIBLE SIGMOIDOSCOPY;  Surgeon: Andria Meuse, MD;  Location: [facility];  Service: General;  Laterality: N/A;   FLEXIBLE SIGMOIDOSCOPY N/A 03/07/2022   Procedure: FLEXIBLE SIGMOIDOSCOPY;  Surgeon: Andria Meuse, MD;  Location: [facility];  Service: General;  Laterality: N/A;   ILEO LOOP DIVERSION N/A 01/12/2022   Procedure: DIVERTING LOOP ILEOSTOMY;  Surgeon: Andria Meuse, MD;  Location: [facility];  Service: General;  Laterality: N/A;   ILEOSTOMY CLOSURE N/A 04/04/2022   Procedure: ILEOSTOMY TAKEDOWN;  Surgeon: Andria Meuse, MD;  Location: [facility];  Service: General;  Laterality: N/A;   NO PAST SURGERIES     XI ROBOTIC ASSISTED LOWER ANTERIOR RESECTION N/A 01/12/2022   Procedure: XI ROBOTIC ASSISTED  LOWER ANTERIOR RESECTION;  Surgeon: Andria Meuse, MD;  Location: [facility];  Service: General;  Laterality: N/A;     Outpatient Encounter Medications as of 03/05/2023  Medication Sig   acetaminophen (TYLENOL) 500 MG tablet Take 1,000 mg by mouth every 6 (six) hours as needed for moderate pain (pain.).   escitalopram (LEXAPRO) 10 MG tablet Take 10 mg by mouth at bedtime.   gabapentin (NEURONTIN) 100 MG capsule Take 1 capsule (100 mg total) by mouth at bedtime. Ok to increase t0 3 capsules at night if needed and tolerates well (Patient taking differently: Take 100-200 mg by mouth at bedtime as needed (nerve pain).)   ibuprofen (ADVIL) 200 MG tablet Take 400 mg by mouth daily as needed (pain.).   omeprazole (PRILOSEC) 20 MG capsule Take 1 capsule (20 mg total) by mouth daily. (Patient taking differently: Take 20 mg by mouth daily as needed (acid reflux).)   oxymetazoline (AFRIN) 0.05 % nasal spray Place 1 spray into both nostrils daily as needed for congestion.   triamcinolone (NASACORT ALLERGY 24HR) 55 MCG/ACT AERO nasal inhaler Place 1 spray into the nose daily as needed (allergies).   Ensure (ENSURE) Take 237 mLs by mouth daily. (Patient not taking: Reported on 12/05/2022)   No facility-administered encounter medications on file as of 03/05/2023.     Today's Vitals   03/05/23 1153 03/05/23 1200 03/05/23 1207  BP: (!) 138/92 (!) 131/99 (!) 139/100  Pulse: (!) 59    Resp: 17  Temp: 98.1 F (36.7 C)    TempSrc: Oral    SpO2: 100%    Weight: 187 lb 8 oz (85 kg)    Height: 6' (1.829 m)    PainSc:  0-No pain    Body mass index is 25.43 kg/m.   PHYSICAL EXAM GENERAL:alert, no distress and comfortable SKIN: no rash  EYES: sclera clear NECK: without mass LYMPH:  no palpable cervical or supraclavicular lymphadenopathy  LUNGS: clear with normal breathing effort HEART: regular rate & rhythm, no lower extremity edema ABDOMEN: abdomen soft, non-tender and normal bowel sounds NEURO:  alert & oriented x 3 with fluent speech, no focal motor/sensory deficits   CBC    Component Value Date/Time   WBC 4.4 03/05/2023 1136   WBC 5.2 10/24/2022 0928   RBC 4.89 03/05/2023 1136   HGB 15.0 03/05/2023 1136   HCT 43.0 03/05/2023 1136   PLT 278 03/05/2023 1136   MCV 87.9 03/05/2023 1136   MCH 30.7 03/05/2023 1136   MCHC 34.9 03/05/2023 1136   RDW 12.7 03/05/2023 1136   LYMPHSABS 1.1 03/05/2023 1136   MONOABS 0.6 03/05/2023 1136   EOSABS 0.1 03/05/2023 1136   BASOSABS 0.0 03/05/2023 1136     CMP     Component Value Date/Time   NA 138 03/05/2023 1136   K 4.3 03/05/2023 1136   CL 104 03/05/2023 1136   CO2 29 03/05/2023 1136   GLUCOSE 96 03/05/2023 1136   BUN 14 03/05/2023 1136   CREATININE 1.18 03/05/2023 1136   CALCIUM 9.8 03/05/2023 1136   PROT 7.3 03/05/2023 1136   ALBUMIN 4.4 03/05/2023 1136   AST 23 03/05/2023 1136   ALT 30 03/05/2023 1136   ALKPHOS 56 03/05/2023 1136   BILITOT 0.5 03/05/2023 1136   GFRNONAA >60 03/05/2023 1136     ASSESSMENT & PLAN:Jerry Allison is a 47 y.o. male with    1. 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 Colonoscopy on 03/07/21 showed a partially-obstructing tumor in proximal rectum. Biopsy confirmed adenocarcinoma and a tubular adenoma polyp. -baseline CEA that normal at 1.7. -pelvic MRI 03/27/21 was also negative for nodal metastasis but was not able to give T stage  -he underwent EUS on 04/27/21 with Dr. Christella Hartigan showing: 4 cm rectal adenocarcinoma invading into and through the muscularis propria layer of rectal wall; 1.5 cm satellite nodule located 5-8 mm distal to tumor. This is staged as T3 -he completed total neoadjuvant treatment with CAPOX 05/10/21 - 09/21/21 (x6 cycles) followed by ccRT 10/09/21 - 11/15/21 -S/p resection by Dr. Cliffton Asters 01/12/22, path showed no residual carcinoma. Post op course complicated by recurrent obstructions. He has recovered well.  -CT  CAP 10/09/2022 showed NED -Surveillance colonoscopy 12/2022 Tomasa Rand) was benign, 2-year recall -Jerry Allison is clinically doing well.  Exam is benign, labs are normal.  Will follow-up the pending CEA from today.  Overall no clinical concern for recurrence -Continue colorectal cancer surveillance -CT and follow-up in 6 months, or sooner if needed; we reviewed signs and symptoms of recurrence    PLAN: -Labs reviewed, CEA is pending -Continue surveillance -Lab and CT in 6 months, f/up a week later -Reviewed s/sx of recurrence, knows to call sooner if needed   Orders Placed This Encounter  Procedures   CT ABDOMEN PELVIS W CONTRAST    Standing Status:   Future    Standing Expiration Date:   03/04/2024    Order Specific Question:   If indicated for the  ordered procedure, I authorize the administration of contrast media per Radiology protocol    Answer:   Yes    Order Specific Question:   Does the patient have a contrast media/X-ray dye allergy?    Answer:   No    Order Specific Question:   Preferred imaging location?    Answer:   [facility]    Order Specific Question:   If indicated for the ordered procedure, I authorize the administration of oral contrast media per Radiology protocol    Answer:   Yes      All questions were answered. The patient knows to call the clinic with any problems, questions or concerns. No barriers to learning were detected.  Jerry Glad, NP-C 03/05/2023

## 2023-08-07 IMAGING — MR MR PELVIS W/O CM
7 series · 48 of 48 positions shown · non-contrast
Comparison: Colonoscopy report of 03/07/2021

CLINICAL DATA: Proximal rectal mass on colonoscopy. At 11-15 cm
from anal verge.

EXAM:
MRI PELVIS WITHOUT CONTRAST
TECHNIQUE: Multiplanar multisequence MR imaging of the pelvis was performed. No
intravenous contrast was administered. Ultrasound gel was
administered per rectum to optimize tumor evaluation.

[Series 3: T2 · sagittal · 3.0mm · 0.74mm/px · 5 of 40 slices shown (1 of 5)]
[im 1/40]
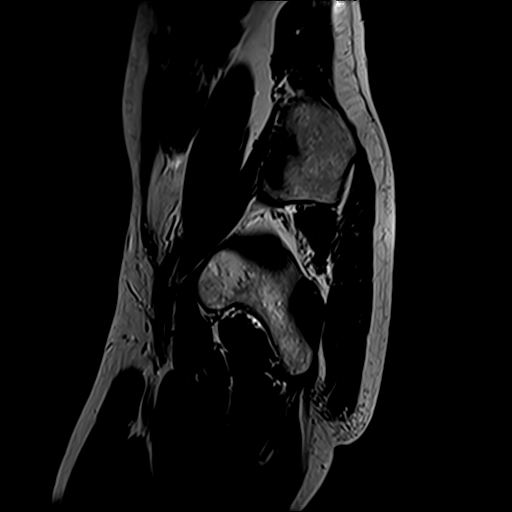
[im 10/40]
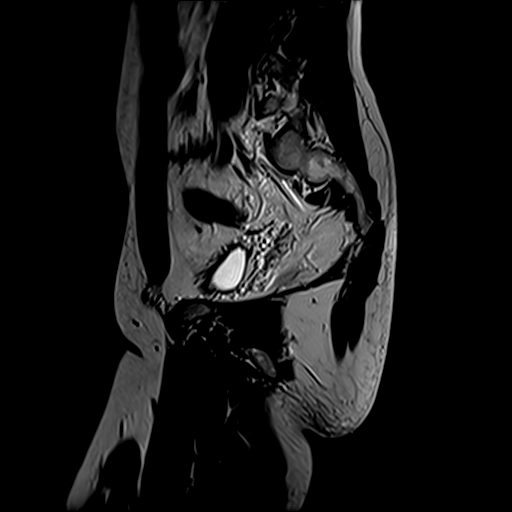
[im 20/40]
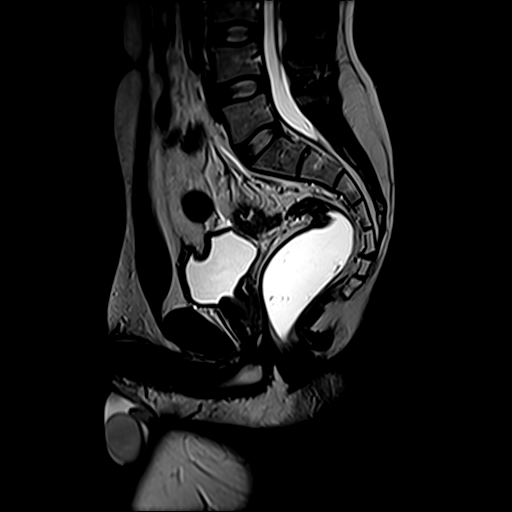
[im 30/40]
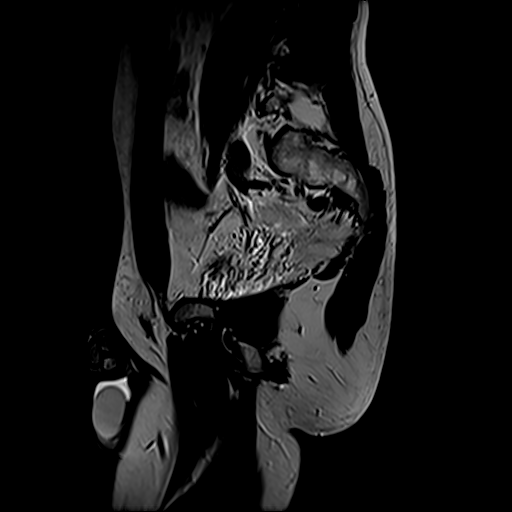
[im 40/40]
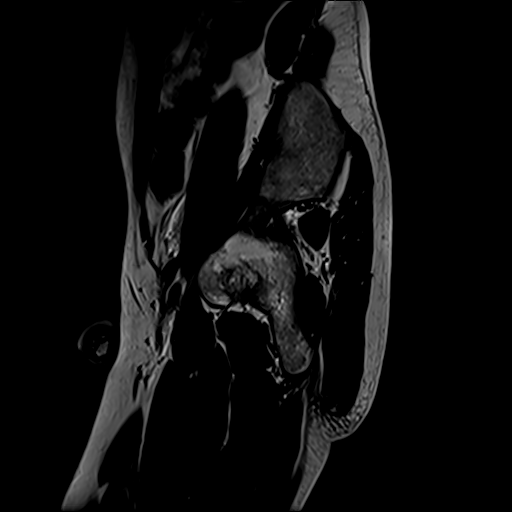

[Series 4: T2 · axial · 5.0mm · 0.99mm/px · z∈[-138,+60]mm · 4 of 34 slices shown (2 of 5)]
[im 1/34]
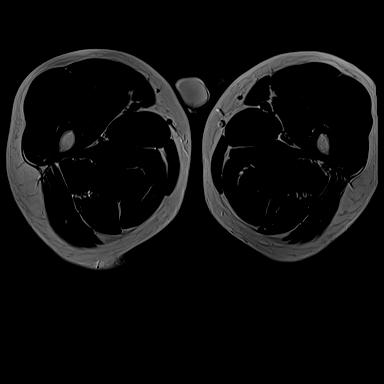
[im 12/34]
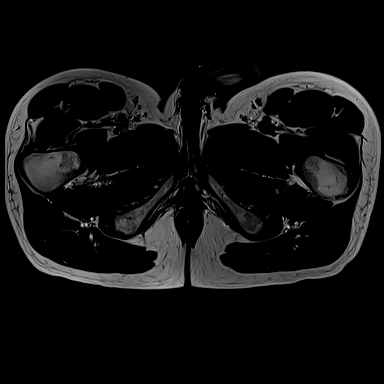
[im 23/34]
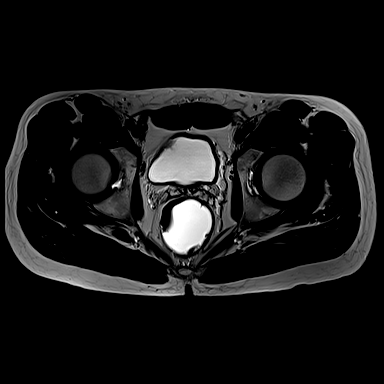
[im 34/34]
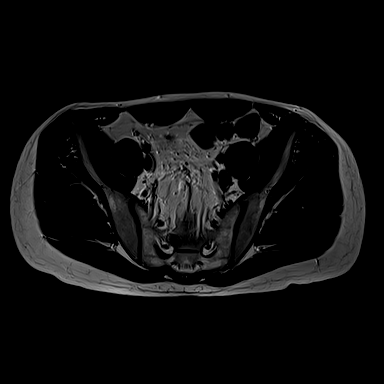

[Series 5: T2 · coronal · 3.0mm · 0.78mm/px · 8 of 55 slices shown (3 of 5)]
[im 1/55]
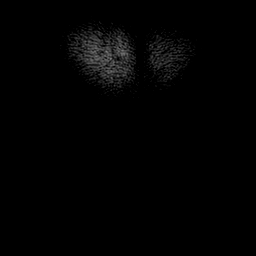
[im 8/55]
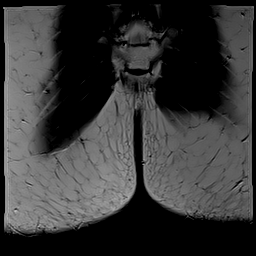
[im 16/55]
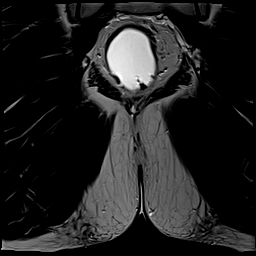
[im 24/55]
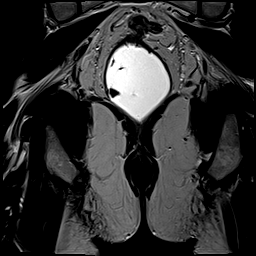
[im 31/55]
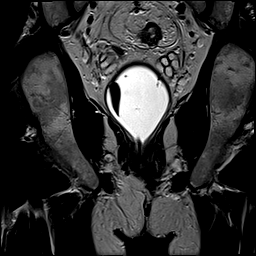
[im 39/55]
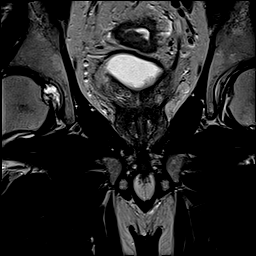
[im 47/55]
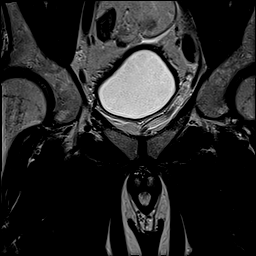
[im 55/55]
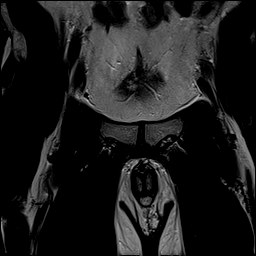

[Series 6: T2 · coronal · 3.0mm · 0.78mm/px · 7 of 50 slices shown (4 of 5)]
[im 1/50]
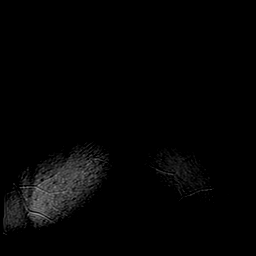
[im 9/50]
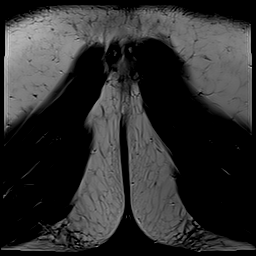
[im 17/50]
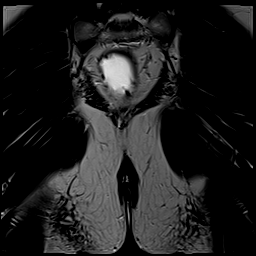
[im 25/50]
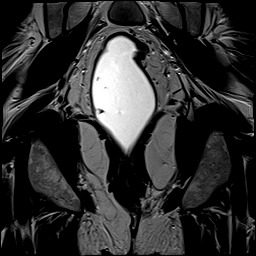
[im 33/50]
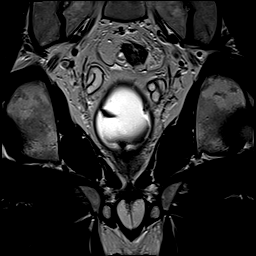
[im 41/50]
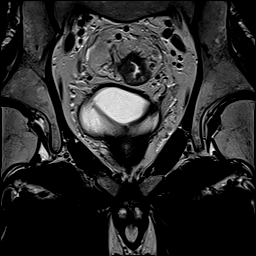
[im 50/50]
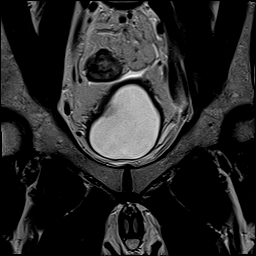

[Series 7: T2 · axial · 3.0mm · 0.78mm/px · z∈[-93,+90]mm · 9 of 63 slices shown (5 of 5)]
[im 1/63]
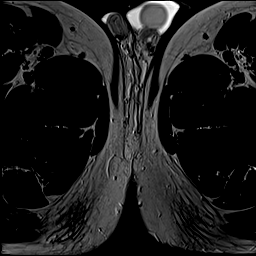
[im 8/63]
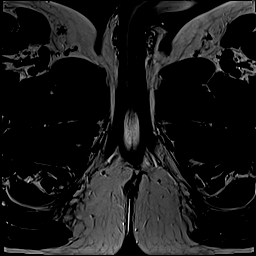
[im 16/63]
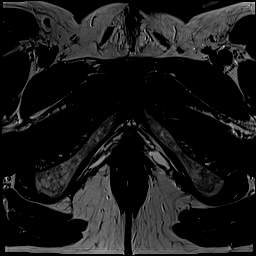
[im 24/63]
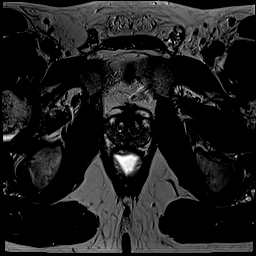
[im 32/63]
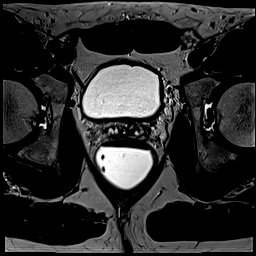
[im 39/63]
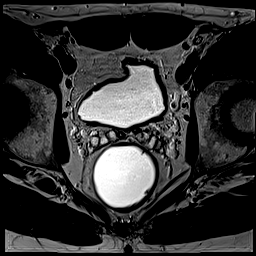
[im 47/63]
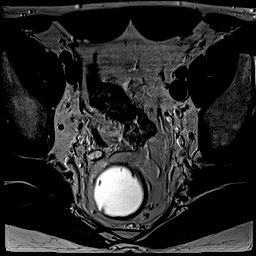
[im 55/63]
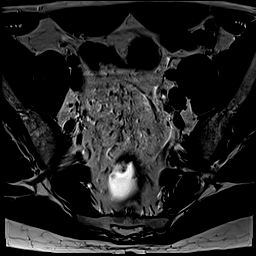
[im 63/63]
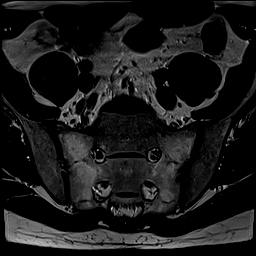

[Series 8: DWI · axial · 5.0mm · 1.48mm/px · z∈[-140,+70]mm · 10 of 71 slices shown (1 of 2)]
[im 1/71]
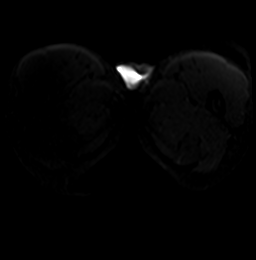
[im 8/71]
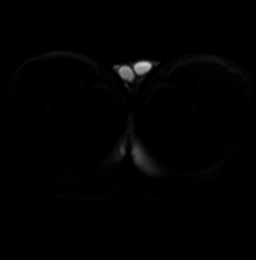
[im 16/71]
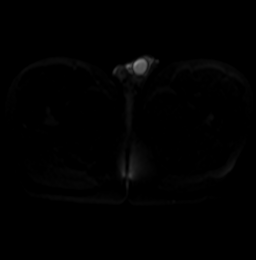
[im 24/71]
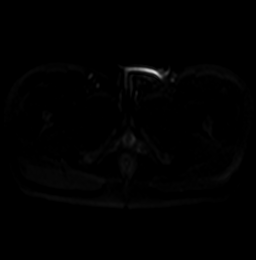
[im 32/71]
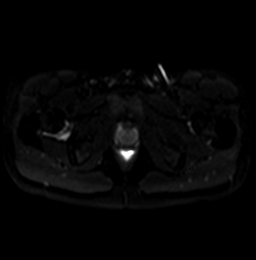
[im 39/71]
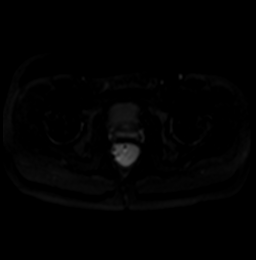
[im 47/71]
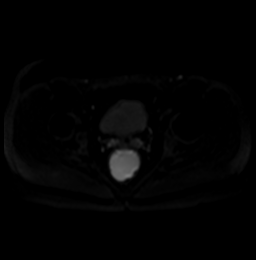
[im 55/71]
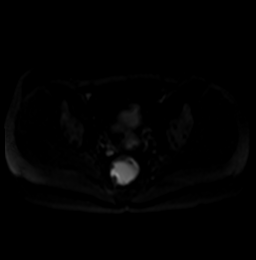
[im 63/71]
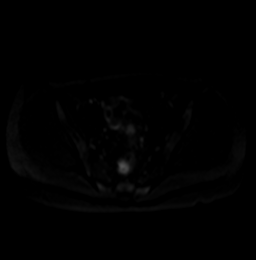
[im 71/71]
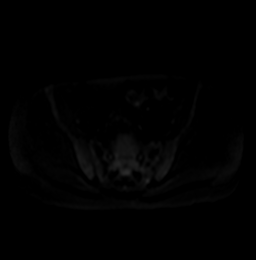

[Series 9: DWI · axial · 5.0mm · 1.48mm/px · z∈[-140,+70]mm · 5 of 36 slices shown (2 of 2)]
[im 1/36]
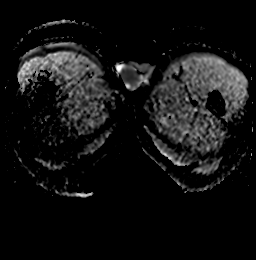
[im 9/36]
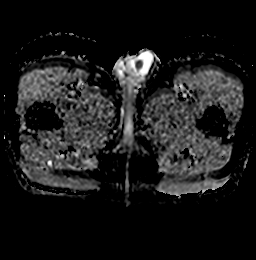
[im 18/36]
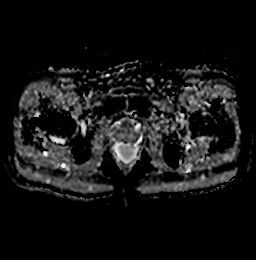
[im 27/36]
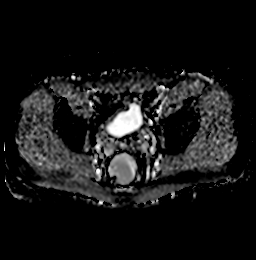
[im 36/36]
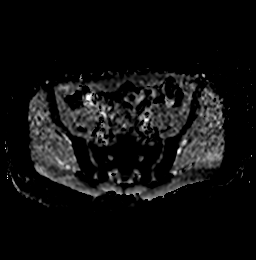

[48 of 48 positions shown; findings below may reference images not displayed]

FINDINGS: TUMOR LOCATION

Tumor distance from Anal Verge/Skin Surface: Approximately 15 cm,
including on [DATE]. This is most consistent with a low sigmoid
primary.

TUMOR DESCRIPTION

Circumferential Extent: Circumferential, including on 41/6.

Tumor Length: Suboptimally delineated secondary to underdistention
within the sigmoid. Based on restricted diffusion, on the order of
5.6 cm on 43/8.

Circumferential low sigmoid mass, as detailed above.

No surrounding lymphadenopathy. There are small nodes in this
surrounding mesocolon, including on [DATE].

Trace fluid within the pelvis and adjacent the sigmoid, including on
[DATE]. New since the prior CT.

No pelvic sidewall adenopathy.

Normal urinary bladder and prostate.  No bowel obstruction.
IMPRESSION: 1. Low sigmoid primary, greater than 15 cm from the anal verge, as
detailed above. Poorly delineated secondary to underdistention in
this region.
2. No evidence of pelvic nodal metastasis.
3. Trace perisigmoid fluid, new since the prior CT.

## 2023-09-03 ENCOUNTER — Ambulatory Visit (HOSPITAL_COMMUNITY)
Admission: RE | Admit: 2023-09-03 | Discharge: 2023-09-03 | Disposition: A | Discharge status: Home / Self Care | Payer: Self-pay | Source: Ambulatory Visit | Attending: Nurse Practitioner | Admitting: Nurse Practitioner

## 2023-09-03 ENCOUNTER — Encounter (HOSPITAL_COMMUNITY): Payer: Self-pay

## 2023-09-03 ENCOUNTER — Inpatient Hospital Stay: Payer: BC Managed Care – PPO | Attending: Nurse Practitioner

## 2023-09-03 DIAGNOSIS — Z79899 Other long term (current) drug therapy: Secondary | ICD-10-CM | POA: Diagnosis not present

## 2023-09-03 DIAGNOSIS — Z9221 Personal history of antineoplastic chemotherapy: Secondary | ICD-10-CM | POA: Diagnosis not present

## 2023-09-03 DIAGNOSIS — N281 Cyst of kidney, acquired: Secondary | ICD-10-CM | POA: Diagnosis not present

## 2023-09-03 DIAGNOSIS — Z923 Personal history of irradiation: Secondary | ICD-10-CM | POA: Insufficient documentation

## 2023-09-03 DIAGNOSIS — G629 Polyneuropathy, unspecified: Secondary | ICD-10-CM | POA: Diagnosis not present

## 2023-09-03 DIAGNOSIS — C20 Malignant neoplasm of rectum: Secondary | ICD-10-CM

## 2023-09-03 LAB — CMP (CANCER CENTER ONLY)
ALT: 26 U/L (ref 0–44)
AST: 23 U/L (ref 15–41)
Albumin: 4.4 g/dL (ref 3.5–5.0)
Alkaline Phosphatase: 48 U/L (ref 38–126)
Anion gap: 4 — ABNORMAL LOW (ref 5–15)
BUN: 13 mg/dL (ref 6–20)
CO2: 30 mmol/L (ref 22–32)
Calcium: 9.5 mg/dL (ref 8.9–10.3)
Chloride: 105 mmol/L (ref 98–111)
Creatinine: 1.28 mg/dL — ABNORMAL HIGH (ref 0.61–1.24)
GFR, Estimated: >60 mL/min (ref >60)
Glucose, Bld: 100 mg/dL — ABNORMAL HIGH (ref 70–99)
Potassium: 4.5 mmol/L (ref 3.5–5.1)
Sodium: 139 mmol/L (ref 135–145)
Total Bilirubin: 0.5 mg/dL (ref 0.0–1.2)
Total Protein: 7.2 g/dL (ref 6.5–8.1)

## 2023-09-03 LAB — CBC WITH DIFFERENTIAL (CANCER CENTER ONLY)
Abs Immature Granulocytes: 0.01 10*3/uL (ref 0.00–0.07)
Basophils Absolute: 0 10*3/uL (ref 0.0–0.1)
Basophils Relative: 0 %
Eosinophils Absolute: 0.2 10*3/uL (ref 0.0–0.5)
Eosinophils Relative: 4 %
HCT: 43.7 % (ref 39.0–52.0)
Hemoglobin: 14.8 g/dL (ref 13.0–17.0)
Immature Granulocytes: 0 %
Lymphocytes Relative: 22 %
Lymphs Abs: 1.2 10*3/uL (ref 0.7–4.0)
MCH: 30 pg (ref 26.0–34.0)
MCHC: 33.9 g/dL (ref 30.0–36.0)
MCV: 88.5 fL (ref 80.0–100.0)
Monocytes Absolute: 0.5 10*3/uL (ref 0.1–1.0)
Monocytes Relative: 10 %
Neutro Abs: 3.6 10*3/uL (ref 1.7–7.7)
Neutrophils Relative %: 64 %
Platelet Count: 252 10*3/uL (ref 150–400)
RBC: 4.94 MIL/uL (ref 4.22–5.81)
RDW: 12.9 % (ref 11.5–15.5)
WBC Count: 5.6 10*3/uL (ref 4.0–10.5)
nRBC: 0 % (ref 0.0–0.2)

## 2023-09-03 LAB — CEA (ACCESS): CEA (CHCC): 1.66 ng/mL (ref 0.00–5.00)

## 2023-09-03 MED ORDER — IOHEXOL 9 MG/ML PO SOLN
1000.0000 mL | ORAL | Status: AC
  Administered 2023-09-03: 1000 mL via ORAL

## 2023-09-03 MED ORDER — IOHEXOL 300 MG/ML  SOLN
100.0000 mL | Freq: Once | INTRAMUSCULAR | Status: AC | PRN
  Administered 2023-09-03: 100 mL via INTRAVENOUS

## 2023-09-03 MED ORDER — IOHEXOL 9 MG/ML PO SOLN
ORAL | Status: AC
  Filled 2023-09-03: qty 1000

## 2023-09-03 MED ORDER — SODIUM CHLORIDE (PF) 0.9 % IJ SOLN
INTRAMUSCULAR | Status: AC
  Filled 2023-09-03: qty 50

## 2023-09-09 ENCOUNTER — Other Ambulatory Visit: Payer: Self-pay

## 2023-09-10 ENCOUNTER — Inpatient Hospital Stay (HOSPITAL_BASED_OUTPATIENT_CLINIC_OR_DEPARTMENT_OTHER): Payer: BC Managed Care – PPO | Admitting: Hematology

## 2023-09-10 VITALS — BP 122/78 | HR 60 | Temp 98.2°F | Resp 20 | Wt 193.2 lb

## 2023-09-10 DIAGNOSIS — C20 Malignant neoplasm of rectum: Secondary | ICD-10-CM | POA: Diagnosis not present

## 2023-09-10 MED ORDER — GABAPENTIN 300 MG PO CAPS: 300.0000 mg | ORAL_CAPSULE | Freq: Every day | ORAL | 3 refills | Status: DC

## 2023-09-10 NOTE — Progress Notes (Signed)
 [facility]   [phone] Fax:([phone]   Clinic Follow up Note   Patient Care Team: Nestor Banter, MD as PCP - General (Family Medicine) Elois Hair, MD as Consulting Physician (Gastroenterology) Sonja [place], MD as Consulting Physician (Hematology) Beatris Lincoln (Inactive) Johna Myers, MD as Consulting Physician (Radiation Oncology) Sherree Doctor, MD as Referring Physician (Surgical Oncology)  Date of Service:  09/10/2023  CHIEF COMPLAINT: f/u of rectal cancer  CURRENT THERAPY:  Cancer surveillance  Oncology History   Adenocarcinoma of rectum (HCC) -uT3, cN0, cM0, stage II, ypT0N0, MSS  -presented with worsening hematochezia. Colonoscopy on 03/07/21 by Dr. Cherryl Corona showed a partially obstructing tumor in proximal rectum. Biopsy confirmed adenocarcinoma and a tubular adenoma polyp. -baseline CEA that day was normal at 1.7. -staging CT CAP 03/09/21 was negative for metastatic disease. -pelvic MRI 03/27/21 was also negative for nodal metastasis but was not able to give T stage  -he underwent EUS on 04/27/21 with Dr. Howard Macho showing: 4 cm rectal adenocarcinoma invading into and through the muscularis propria layer of rectal wall; 1.5 cm satellite nodule located 5-8 mm distal to tumor. This is staged as T3 -he began CAPOX on 05/10/21. He tolerated first cycle poorly with a lot of vomiting and fatigue. He tolerated cycle 2 better with Emend and reduced oxali dose. We held cycle 3 due to worsening transaminitis. Abdomen US  on 06/29/21 was negative for metastatic disease, showed only mild steatosis. He completed 6 cycles on 09/21/21. -he received concurrent chemoRT with Xeloda 10/09/21 - 11/15/21. He tolerated relatively well overall, with expected GI side effects from radiation towards the end of treatment. -restaging CT CAP performed 11/14/21 showed decreased rectal thickening and no metastatic disease.  -s/p resection 12/1821 by Dr. Camilo Cella, path  showed complete pathological response, with no residual carcinoma. His postoperative course was complicated by recurrent obstructions. -CT from 10/09/2022 showed no evidence of recurrent or metastatic carcinoma.     Assessment & Plan Rectal cancer Two and a half years post-diagnosis of rectal cancer with no current symptoms such as hematochezia or significant abdominal pain. Bowel movements are irregular, tending towards constipation. Mild neuropathy persists as a residual effect of previous chemotherapy and radiation. CT scan reveals a 4 mm lung nodule and liver lesion, both likely benign. Blood counts and tumor markers are within normal limits. The risk of recurrence significantly decreases after two years post-diagnosis. 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 Managed with gabapentin, which is effective but causes drowsiness. Gabapentin is taken at night to mitigate daytime drowsiness. - Prescribe gabapentin 300 mg for nighttime use - Provide three months' supply with refills - Discuss option to wean off gabapentin if symptoms allow  Kidney cyst Kidney cyst present for at least two years with no current recommendations for further follow-up.  Dental procedure clearance Plans for tooth extraction and implant within six months. No oncological contraindications. - Clear for dental procedures  Plan - Lab and CT scan reviewed, ED Routine follow-up scheduled in six months with a scan to be ordered on next visit  - Schedule follow-up appointment in six months      SUMMARY OF ONCOLOGIC HISTORY: Oncology History Overview Note   Cancer Staging  Adenocarcinoma of rectum [facility]) Staging form: Colon and Rectum - Neuroendocine Tumors, AJCC 8th Edition - Clinical stage from  03/29/2021: Stage IIB (cT3, cN0, cM0) - Signed by Malachy Mood, MD on 06/20/2021    Adenocarcinoma of rectum [facility])  03/07/2021 Procedure   Colonoscopy, Dr. Tomasa Rand  Impression - Malignant appearing partially obstructing tumor in the proximal rectum. Biopsied. Tattooed. - One 3 mm polyp in the distal rectum, removed with a cold snare. Resected and retrieved. - The examined portion of the ileum was normal. - The distal rectum and anal verge are normal on retroflexion view.   03/07/2021 Pathology Results   Diagnosis 1. Rectum, biopsy, mass - ADENOCARCINOMA - SEE COMMENT 2. Rectum, biopsy, polyp (1) - TUBULAR ADENOMA (1 OF 1 FRAGMENTS) - NO HIGH-GRADE DYSPLASIA OR MALIGNANCY IDENTIFIED   03/09/2021 Imaging   CT CAP  IMPRESSION: 1. Asymmetric wall thickening in the proximal/mid rectum may correspond to mass seen on endoscopy. 2. Negative for adenopathy or evidence of distal metastatic disease.   03/27/2021 Imaging   MRI Pelvis  IMPRESSION: 1. Low sigmoid primary, greater than 15 cm from the anal verge, as detailed above. Poorly delineated secondary to underdistention in this region. 2. No evidence of pelvic nodal metastasis. 3. Trace perisigmoid fluid, new since the prior CT.   03/28/2021 Initial Diagnosis   Adenocarcinoma of rectum (HCC)   03/29/2021 Cancer Staging   Staging form: Colon and Rectum - Neuroendocine Tumors, AJCC 8th Edition - Clinical stage from 03/29/2021: Stage IIB (cT3, cN0, cM0) - Signed by Malachy Mood, MD on 06/20/2021 Stage prefix: Initial diagnosis Histologic grade (G): G2 Histologic grading system: 3 grade system   04/12/2021 Genetic Testing   Negative genetic testing on the CancerNext-Expanded+RNAinsight panel.  The report date is 04/12/2021.  The CancerNext-Expanded gene panel offered by [facility] and includes sequencing and rearrangement analysis for the following 77 genes: AIP, ALK, APC*, ATM*, AXIN2, BAP1, BARD1, BLM, BMPR1A, BRCA1*, BRCA2*,  BRIP1*, CDC73, CDH1*, CDK4, CDKN1B, CDKN2A, CHEK2*, CTNNA1, DICER1, FANCC, FH, FLCN, GALNT12, KIF1B, LZTR1, MAX, MEN1, MET, MLH1*, MSH2*, MSH3, MSH6*, MUTYH*, NBN, NF1*, NF2, NTHL1, PALB2*, PHOX2B, PMS2*, POT1, PRKAR1A, PTCH1, PTEN*, RAD51C*, RAD51D*, RB1, RECQL, RET, SDHA, SDHAF2, SDHB, SDHC, SDHD, SMAD4, SMARCA4, SMARCB1, SMARCE1, STK11, SUFU, TMEM127, TP53*, TSC1, TSC2, VHL and XRCC2 (sequencing and deletion/duplication); EGFR, EGLN1, HOXB13, KIT, MITF, PDGFRA, POLD1, and POLE (sequencing only); EPCAM and GREM1 (deletion/duplication only). DNA and RNA analyses performed for * genes.    04/27/2021 Procedure   EUS, Dr. Christella Hartigan  Impression: - 4cm long, circumferential, at least uT3Nx rectal adenocarcinoma with distal edge located 10cm from the anal verge. 1.5cm satellite nodule located 5-44mm distal to the tumor (around 9cm from the anal verge).   05/10/2021 - 10/04/2021 Chemotherapy   Patient is on Treatment Plan : RECTAL Xelox (Capeox) q21d x 6 cycles      01/12/2022 Definitive Surgery   FINAL MICROSCOPIC DIAGNOSIS:   A. COLON, RECTOSIGMOID, RESECTION:  - Fibrosis and mild chronic inflammation consistent with treatment  effect.  - No residual carcinoma (ypT0).  - All surgical margins negative for tumor.  - Eighteen lymph nodes negative for metastatic carcinoma (0/18)(ypN0).  - See oncology table.       Discussed the use of AI scribe software for clinical note transcription with the patient, who gave verbal consent to proceed.  History of Present Illness The patient, a 48 year old gentleman with a history of rectal cancer, presents for a routine follow-up. He reports sporadic bowel movements, leaning towards constipation, and occasional abdominal discomfort. He denies any blood in the stool or significant pain. His energy and appetite remain normal. He  continues to experience residual neuropathy from previous chemotherapy, which has improved significantly but still requires management with  gabapentin. He is also planning to undergo a dental procedure in the near future.     All other systems were reviewed with the patient and are negative.  MEDICAL HISTORY:  Past Medical History:  Diagnosis Date   Anxiety    Depression    Family history of bladder cancer    GERD (gastroesophageal reflux disease)    Rectal cancer (HCC) 02/2021    SURGICAL HISTORY: Past Surgical History:  Procedure Laterality Date   COLONOSCOPY     EUS N/A 04/27/2021   Procedure: LOWER ENDOSCOPIC ULTRASOUND (EUS);  Surgeon: Janel Medford, MD;  Location: [facility];  Service: Endoscopy;  Laterality: N/A;   FLEXIBLE SIGMOIDOSCOPY N/A 04/27/2021   Procedure: FLEXIBLE SIGMOIDOSCOPY;  Surgeon: Janel Medford, MD;  Location: [facility];  Service: Endoscopy;  Laterality: N/A;   FLEXIBLE SIGMOIDOSCOPY N/A 01/12/2022   Procedure: FLEXIBLE SIGMOIDOSCOPY;  Surgeon: Melvenia Stabs, MD;  Location: [facility];  Service: General;  Laterality: N/A;   FLEXIBLE SIGMOIDOSCOPY N/A 03/07/2022   Procedure: FLEXIBLE SIGMOIDOSCOPY;  Surgeon: Melvenia Stabs, MD;  Location: [facility];  Service: General;  Laterality: N/A;   ILEO LOOP DIVERSION N/A 01/12/2022   Procedure: DIVERTING LOOP ILEOSTOMY;  Surgeon: Melvenia Stabs, MD;  Location: [facility];  Service: General;  Laterality: N/A;   ILEOSTOMY CLOSURE N/A 04/04/2022   Procedure: ILEOSTOMY TAKEDOWN;  Surgeon: Melvenia Stabs, MD;  Location: [facility];  Service: General;  Laterality: N/A;   NO PAST SURGERIES     XI ROBOTIC ASSISTED LOWER ANTERIOR RESECTION N/A 01/12/2022   Procedure: XI ROBOTIC ASSISTED LOWER ANTERIOR RESECTION;  Surgeon: Melvenia Stabs, MD;  Location: [facility];  Service: General;  Laterality: N/A;    I have reviewed the social history and family history with the patient and they are unchanged from previous note.  ALLERGIES:  is allergic to shellfish allergy.  MEDICATIONS:  Current Outpatient Medications  Medication Sig Dispense  Refill   acetaminophen (TYLENOL) 500 MG tablet Take 1,000 mg by mouth every 6 (six) hours as needed for moderate pain (pain.).     Ensure (ENSURE) Take 237 mLs by mouth daily.     escitalopram (LEXAPRO) 10 MG tablet Take 10 mg by mouth at bedtime.     gabapentin (NEURONTIN) 300 MG capsule Take 1 capsule (300 mg total) by mouth at bedtime. 90 capsule 3   ibuprofen (ADVIL) 200 MG tablet Take 400 mg by mouth daily as needed (pain.).     omeprazole (PRILOSEC) 20 MG capsule Take 1 capsule (20 mg total) by mouth daily. (Patient taking differently: Take 20 mg by mouth daily as needed (acid reflux).) 30 capsule 0   oxymetazoline (AFRIN) 0.05 % nasal spray Place 1 spray into both nostrils daily as needed for congestion.     triamcinolone (NASACORT ALLERGY 24HR) 55 MCG/ACT AERO nasal inhaler Place 1 spray into the nose daily as needed (allergies).     No current facility-administered medications for this visit.    PHYSICAL EXAMINATION: ECOG PERFORMANCE STATUS: 0 - Asymptomatic  Vitals:   09/10/23 1109  BP: 122/78  Pulse: 60  Resp: 20  Temp: 98.2 F (36.8 C)  SpO2: 99%   Wt Readings from Last 3 Encounters:  09/10/23 193 lb 3.2 oz (87.6 kg)  03/05/23 187 lb 8 oz (85 kg)  01/14/23 185 lb (83.9 kg)     GENERAL:alert, no distress and  comfortable SKIN: skin color, texture, turgor are normal, no rashes or significant lesions EYES: normal, Conjunctiva are pink and non-injected, sclera clear NECK: supple, thyroid normal size, non-tender, without nodularity LYMPH:  no palpable lymphadenopathy in the cervical, axillary  LUNGS: clear to auscultation and percussion with normal breathing effort HEART: regular rate & rhythm and no murmurs and no lower extremity edema ABDOMEN:abdomen soft, non-tender and normal bowel sounds Musculoskeletal:no cyanosis of digits and no clubbing  NEURO: alert & oriented x 3 with fluent speech, no focal motor/sensory deficits  Physical Exam CHEST: Lungs  normal ABDOMEN: Abdomen non-tender  LABORATORY DATA:  I have reviewed the data as listed    Latest Ref Rng & Units 09/03/2023    9:38 AM 03/05/2023   11:36 AM 10/24/2022    9:28 AM  CBC  WBC 4.0 - 10.5 K/uL 5.6  4.4  5.2   Hemoglobin 13.0 - 17.0 g/dL 40.9  81.1  91.4   Hematocrit 39.0 - 52.0 % 43.7  43.0  42.4   Platelets 150 - 400 K/uL 252  278  292         Latest Ref Rng & Units 09/03/2023    9:38 AM 03/05/2023   11:36 AM 10/24/2022    9:28 AM  CMP  Glucose 70 - 99 mg/dL 782  96  956   BUN 6 - 20 mg/dL 13  14  14    Creatinine 0.61 - 1.24 mg/dL 2.13  0.86  5.78   Sodium 135 - 145 mmol/L 139  138  138   Potassium 3.5 - 5.1 mmol/L 4.5  4.3  4.5   Chloride 98 - 111 mmol/L 105  104  106   CO2 22 - 32 mmol/L 30  29  28    Calcium 8.9 - 10.3 mg/dL 9.5  9.8  9.1   Total Protein 6.5 - 8.1 g/dL 7.2  7.3  7.0   Total Bilirubin 0.0 - 1.2 mg/dL 0.5  0.5  0.6   Alkaline Phos 38 - 126 U/L 48  56  62   AST 15 - 41 U/L 23  23  25    ALT 0 - 44 U/L 26  30  45       RADIOGRAPHIC STUDIES: I have personally reviewed the radiological images as listed and agreed with the findings in the report. No results found.    No orders of the defined types were placed in this encounter.  All questions were answered. The patient knows to call the clinic with any problems, questions or concerns. No barriers to learning was detected. The total time spent in the appointment was 25 minutes.     Sonja Crossett, MD 09/10/2023

## 2023-09-10 NOTE — Assessment & Plan Note (Signed)
-  uT3, cN0, cM0, stage II, ypT0N0, MSS  -presented with worsening hematochezia. Colonoscopy on 03/07/21 by Dr. Cherryl Corona showed a partially obstructing tumor in proximal rectum. Biopsy confirmed adenocarcinoma and a tubular adenoma polyp. -baseline CEA that day was normal at 1.7. -staging CT CAP 03/09/21 was negative for metastatic disease. -pelvic MRI 03/27/21 was also negative for nodal metastasis but was not able to give T stage  -he underwent EUS on 04/27/21 with Dr. Howard Macho showing: 4 cm rectal adenocarcinoma invading into and through the muscularis propria layer of rectal wall; 1.5 cm satellite nodule located 5-8 mm distal to tumor. This is staged as T3 -he began CAPOX on 05/10/21. He tolerated first cycle poorly with a lot of vomiting and fatigue. He tolerated cycle 2 better with Emend and reduced oxali dose. We held cycle 3 due to worsening transaminitis. Abdomen US  on 06/29/21 was negative for metastatic disease, showed only mild steatosis. He completed 6 cycles on 09/21/21. -he received concurrent chemoRT with Xeloda 10/09/21 - 11/15/21. He tolerated relatively well overall, with expected GI side effects from radiation towards the end of treatment. -restaging CT CAP performed 11/14/21 showed decreased rectal thickening and no metastatic disease.  -s/p resection 12/1821 by Dr. Camilo Cella, path showed complete pathological response, with no residual carcinoma. His postoperative course was complicated by recurrent obstructions. -CT from 10/09/2022 showed no evidence of recurrent or metastatic carcinoma.

## 2023-11-26 ENCOUNTER — Other Ambulatory Visit: Payer: Self-pay | Admitting: Hematology

## 2024-03-11 ENCOUNTER — Inpatient Hospital Stay: Admitting: Hematology

## 2024-03-11 ENCOUNTER — Other Ambulatory Visit: Payer: Self-pay

## 2024-03-11 ENCOUNTER — Inpatient Hospital Stay: Attending: Hematology

## 2024-03-11 VITALS — BP 126/76 | HR 81 | Temp 98.4°F | Resp 15 | Ht 72.0 in | Wt 194.5 lb

## 2024-03-11 DIAGNOSIS — C20 Malignant neoplasm of rectum: Secondary | ICD-10-CM | POA: Diagnosis not present

## 2024-03-11 DIAGNOSIS — R194 Change in bowel habit: Secondary | ICD-10-CM | POA: Diagnosis not present

## 2024-03-11 DIAGNOSIS — R911 Solitary pulmonary nodule: Secondary | ICD-10-CM | POA: Insufficient documentation

## 2024-03-11 DIAGNOSIS — Z923 Personal history of irradiation: Secondary | ICD-10-CM | POA: Diagnosis not present

## 2024-03-11 DIAGNOSIS — Z79899 Other long term (current) drug therapy: Secondary | ICD-10-CM | POA: Diagnosis not present

## 2024-03-11 DIAGNOSIS — Z9221 Personal history of antineoplastic chemotherapy: Secondary | ICD-10-CM | POA: Diagnosis not present

## 2024-03-11 LAB — CBC WITH DIFFERENTIAL (CANCER CENTER ONLY)
Abs Immature Granulocytes: 0.01 K/uL (ref 0.00–0.07)
Basophils Absolute: 0 K/uL (ref 0.0–0.1)
Basophils Relative: 0 %
Eosinophils Absolute: 0.2 K/uL (ref 0.0–0.5)
Eosinophils Relative: 4 %
HCT: 44.1 % (ref 39.0–52.0)
Hemoglobin: 15.1 g/dL (ref 13.0–17.0)
Immature Granulocytes: 0 %
Lymphocytes Relative: 23 %
Lymphs Abs: 1.2 K/uL (ref 0.7–4.0)
MCH: 30 pg (ref 26.0–34.0)
MCHC: 34.2 g/dL (ref 30.0–36.0)
MCV: 87.7 fL (ref 80.0–100.0)
Monocytes Absolute: 0.7 K/uL (ref 0.1–1.0)
Monocytes Relative: 13 %
Neutro Abs: 3.2 K/uL (ref 1.7–7.7)
Neutrophils Relative %: 60 %
Platelet Count: 281 K/uL (ref 150–400)
RBC: 5.03 MIL/uL (ref 4.22–5.81)
RDW: 12.7 % (ref 11.5–15.5)
WBC Count: 5.4 K/uL (ref 4.0–10.5)
nRBC: 0 % (ref 0.0–0.2)

## 2024-03-11 LAB — CMP (CANCER CENTER ONLY)
ALT: 32 U/L (ref 0–44)
AST: 25 U/L (ref 15–41)
Albumin: 4.5 g/dL (ref 3.5–5.0)
Alkaline Phosphatase: 54 U/L (ref 38–126)
Anion gap: 5 (ref 5–15)
BUN: 15 mg/dL (ref 6–20)
CO2: 31 mmol/L (ref 22–32)
Calcium: 10.3 mg/dL (ref 8.9–10.3)
Chloride: 103 mmol/L (ref 98–111)
Creatinine: 1.37 mg/dL — ABNORMAL HIGH (ref 0.61–1.24)
GFR, Estimated: >60 mL/min (ref >60)
Glucose, Bld: 96 mg/dL (ref 70–99)
Potassium: 4.5 mmol/L (ref 3.5–5.1)
Sodium: 139 mmol/L (ref 135–145)
Total Bilirubin: 0.4 mg/dL (ref 0.0–1.2)
Total Protein: 7.4 g/dL (ref 6.5–8.1)

## 2024-03-11 NOTE — Assessment & Plan Note (Signed)
-  uT3, cN0, cM0, stage II, ypT0N0, MSS  -presented with worsening hematochezia. Colonoscopy on 03/07/21 by Dr. Cherryl Corona showed a partially obstructing tumor in proximal rectum. Biopsy confirmed adenocarcinoma and a tubular adenoma polyp. -baseline CEA that day was normal at 1.7. -staging CT CAP 03/09/21 was negative for metastatic disease. -pelvic MRI 03/27/21 was also negative for nodal metastasis but was not able to give T stage  -he underwent EUS on 04/27/21 with Dr. Howard Macho showing: 4 cm rectal adenocarcinoma invading into and through the muscularis propria layer of rectal wall; 1.5 cm satellite nodule located 5-8 mm distal to tumor. This is staged as T3 -he began CAPOX on 05/10/21. He tolerated first cycle poorly with a lot of vomiting and fatigue. He tolerated cycle 2 better with Emend and reduced oxali dose. We held cycle 3 due to worsening transaminitis. Abdomen US  on 06/29/21 was negative for metastatic disease, showed only mild steatosis. He completed 6 cycles on 09/21/21. -he received concurrent chemoRT with Xeloda 10/09/21 - 11/15/21. He tolerated relatively well overall, with expected GI side effects from radiation towards the end of treatment. -restaging CT CAP performed 11/14/21 showed decreased rectal thickening and no metastatic disease.  -s/p resection 12/1821 by Dr. Camilo Cella, path showed complete pathological response, with no residual carcinoma. His postoperative course was complicated by recurrent obstructions. -CT from 10/09/2022 showed no evidence of recurrent or metastatic carcinoma.

## 2024-03-11 NOTE — Progress Notes (Signed)
 [facility]   [phone] [phone]   Clinic Follow up Note   Patient Care Team: Diedra Lame, MD as PCP - General (Family Medicine) Stacia Glendia BRAVO, MD as Consulting Physician (Gastroenterology) Lanny Callander, MD as Consulting Physician (Hematology) Teresa Bruckner (Inactive) Dewey Rush, MD as Consulting Physician (Radiation Oncology) Darral Hacker, MD as Referring Physician (Surgical Oncology)  Date of Service:  03/11/2024  CHIEF COMPLAINT: f/u of rectal cancer  CURRENT THERAPY:  Cancer surveillance  Oncology History   Adenocarcinoma of rectum (HCC) -uT3, cN0, cM0, stage II, ypT0N0, MSS  -presented with worsening hematochezia. Colonoscopy on 03/07/21 by Dr. Stacia showed a partially obstructing tumor in proximal rectum. Biopsy confirmed adenocarcinoma and a tubular adenoma polyp. -baseline CEA that day was normal at 1.7. -staging CT CAP 03/09/21 was negative for metastatic disease. -pelvic MRI 03/27/21 was also negative for nodal metastasis but was not able to give T stage  -he underwent EUS on 04/27/21 with Dr. Teressa showing: 4 cm rectal adenocarcinoma invading into and through the muscularis propria layer of rectal wall; 1.5 cm satellite nodule located 5-8 mm distal to tumor. This is staged as T3 -he began CAPOX on 05/10/21. He tolerated first cycle poorly with a lot of vomiting and fatigue. He tolerated cycle 2 better with Emend  and reduced oxali dose. We held cycle 3 due to worsening transaminitis. Abdomen US  on 06/29/21 was negative for metastatic disease, showed only mild steatosis. He completed 6 cycles on 09/21/21. -he received concurrent chemoRT with Xeloda  10/09/21 - 11/15/21. He tolerated relatively well overall, with expected GI side effects from radiation towards the end of treatment. -restaging CT CAP performed 11/14/21 showed decreased rectal thickening and no metastatic disease.  -s/p resection 12/1821 by Dr. Teresa, path  showed complete pathological response, with no residual carcinoma. His postoperative course was complicated by recurrent obstructions. -CT from 10/09/2022 showed no evidence of recurrent or metastatic carcinoma.    Assessment & Plan Rectal cancer, status post treatment, under surveillance Rectal cancer diagnosed in October 2022, status post treatment. Currently under surveillance with no new symptoms. No hematochezia, no abdominal pain, and stable weight. Normal energy levels. Blood counts and tumor markers are within normal limits. Risk of recurrence is significantly reduced, with most recurrences occurring within the first three years post-treatment. - Order CT scan of chest, abdomen, and pelvis with contrast in six months - Schedule follow-up appointment in six months, one week post-scan  Bowel habit changes post rectal cancer treatment Bowel habit changes post-treatment, likely due to anatomical changes from surgery. Bowel movements vary between solid and loose, occasionally requiring multiple trips to the bathroom in a short period.  Lung nodule, under surveillance Very small lung nodule under surveillance with no new symptoms. - Include lung in CT scan of chest, abdomen, and pelvis in six months  Plan - Patient is clinically doing very well, exam unremarkable, lab reviewed.  No clinical concern for recurrence. - Follow-up in 6 months with lab and a surveillance CT scan 1 week before     SUMMARY OF ONCOLOGIC HISTORY: Oncology History Overview Note   Cancer Staging  Adenocarcinoma of rectum [facility]) Staging form: Colon and Rectum - Neuroendocine Tumors, AJCC 8th Edition - Clinical stage from 03/29/2021: Stage IIB (cT3, cN0, cM0) - Signed by Lanny Callander, MD on 06/20/2021    Adenocarcinoma of rectum (HCC)  03/07/2021 Procedure   Colonoscopy, Dr. Stacia  Impression - Malignant appearing partially obstructing tumor in the proximal rectum. Biopsied. Tattooed. - One  3 mm polyp in the  distal rectum, removed with a cold snare. Resected and retrieved. - The examined portion of the ileum was normal. - The distal rectum and anal verge are normal on retroflexion view.   03/07/2021 Pathology Results   Diagnosis 1. Rectum, biopsy, mass - ADENOCARCINOMA - SEE COMMENT 2. Rectum, biopsy, polyp (1) - TUBULAR ADENOMA (1 OF 1 FRAGMENTS) - NO HIGH-GRADE DYSPLASIA OR MALIGNANCY IDENTIFIED   03/09/2021 Imaging   CT CAP  IMPRESSION: 1. Asymmetric wall thickening in the proximal/mid rectum may correspond to mass seen on endoscopy. 2. Negative for adenopathy or evidence of distal metastatic disease.   03/27/2021 Imaging   MRI Pelvis  IMPRESSION: 1. Low sigmoid primary, greater than 15 cm from the anal verge, as detailed above. Poorly delineated secondary to underdistention in this region. 2. No evidence of pelvic nodal metastasis. 3. Trace perisigmoid fluid, new since the prior CT.   03/28/2021 Initial Diagnosis   Adenocarcinoma of rectum (HCC)   03/29/2021 Cancer Staging   Staging form: Colon and Rectum - Neuroendocine Tumors, AJCC 8th Edition - Clinical stage from 03/29/2021: Stage IIB (cT3, cN0, cM0) - Signed by Lanny Callander, MD on 06/20/2021 Stage prefix: Initial diagnosis Histologic grade (G): G2 Histologic grading system: 3 grade system   04/12/2021 Genetic Testing   Negative genetic testing on the CancerNext-Expanded+RNAinsight panel.  The report date is 04/12/2021.  The CancerNext-Expanded gene panel offered by [facility] and includes sequencing and rearrangement analysis for the following 77 genes: AIP, ALK, APC*, ATM*, AXIN2, BAP1, BARD1, BLM, BMPR1A, BRCA1*, BRCA2*, BRIP1*, CDC73, CDH1*, CDK4, CDKN1B, CDKN2A, CHEK2*, CTNNA1, DICER1, FANCC, FH, FLCN, GALNT12, KIF1B, LZTR1, MAX, MEN1, MET, MLH1*, MSH2*, MSH3, MSH6*, MUTYH*, NBN, NF1*, NF2, NTHL1, PALB2*, PHOX2B, PMS2*, POT1, PRKAR1A, PTCH1, PTEN*, RAD51C*, RAD51D*, RB1, RECQL, RET, SDHA, SDHAF2, SDHB, SDHC, SDHD,  SMAD4, SMARCA4, SMARCB1, SMARCE1, STK11, SUFU, TMEM127, TP53*, TSC1, TSC2, VHL and XRCC2 (sequencing and deletion/duplication); EGFR, EGLN1, HOXB13, KIT, MITF, PDGFRA, POLD1, and POLE (sequencing only); EPCAM and GREM1 (deletion/duplication only). DNA and RNA analyses performed for * genes.    04/27/2021 Procedure   EUS, Dr. Teressa  Impression: - 4cm long, circumferential, at least uT3Nx rectal adenocarcinoma with distal edge located 10cm from the anal verge. 1.5cm satellite nodule located 5-41mm distal to the tumor (around 9cm from the anal verge).   05/10/2021 - 10/04/2021 Chemotherapy   Patient is on Treatment Plan : RECTAL Xelox (Capeox) q21d x 6 cycles      01/12/2022 Definitive Surgery   FINAL MICROSCOPIC DIAGNOSIS:   A. COLON, RECTOSIGMOID, RESECTION:  - Fibrosis and mild chronic inflammation consistent with treatment  effect.  - No residual carcinoma (ypT0).  - All surgical margins negative for tumor.  - Eighteen lymph nodes negative for metastatic carcinoma (0/18)(ypN0).  - See oncology table.       Discussed the use of AI scribe software for clinical note transcription with the patient, who gave verbal consent to proceed.  History of Present Illness Jerry Allison is a 48 year old male with rectal cancer who presents for follow-up.  He experiences variability in bowel movements, alternating between solid and loose or liquid stools, often requiring multiple trips to the bathroom for full evacuation. There is no blood in stool, abdominal pain, nausea, or changes in appetite. Weight remains stable at 194 pounds, and energy levels are normal despite reduced physical activity.  He has discontinued gabapentin , previously used for residual numbness and tingling from chemotherapy, with noted improvement in symptoms. He continues  Lexapro  and uses omeprazole  as needed for severe heartburn.  His last colonoscopy was performed last year, and a CT scan in April 2025 showed satisfactory  tumor markers. Genetic testing was negative.     All other systems were reviewed with the patient and are negative.  MEDICAL HISTORY:  Past Medical History:  Diagnosis Date   Anxiety    Depression    Family history of bladder cancer    GERD (gastroesophageal reflux disease)    Rectal cancer (HCC) 02/2021    SURGICAL HISTORY: Past Surgical History:  Procedure Laterality Date   COLONOSCOPY     EUS N/A 04/27/2021   Procedure: LOWER ENDOSCOPIC ULTRASOUND (EUS);  Surgeon: Teressa Toribio SQUIBB, MD;  Location: [facility];  Service: Endoscopy;  Laterality: N/A;   FLEXIBLE SIGMOIDOSCOPY N/A 04/27/2021   Procedure: FLEXIBLE SIGMOIDOSCOPY;  Surgeon: Teressa Toribio SQUIBB, MD;  Location: [facility];  Service: Endoscopy;  Laterality: N/A;   FLEXIBLE SIGMOIDOSCOPY N/A 01/12/2022   Procedure: FLEXIBLE SIGMOIDOSCOPY;  Surgeon: Teresa Lonni HERO, MD;  Location: [facility];  Service: General;  Laterality: N/A;   FLEXIBLE SIGMOIDOSCOPY N/A 03/07/2022   Procedure: FLEXIBLE SIGMOIDOSCOPY;  Surgeon: Teresa Lonni HERO, MD;  Location: [facility];  Service: General;  Laterality: N/A;   ILEO LOOP DIVERSION N/A 01/12/2022   Procedure: DIVERTING LOOP ILEOSTOMY;  Surgeon: Teresa Lonni HERO, MD;  Location: [facility];  Service: General;  Laterality: N/A;   ILEOSTOMY CLOSURE N/A 04/04/2022   Procedure: ILEOSTOMY TAKEDOWN;  Surgeon: Teresa Lonni HERO, MD;  Location: [facility];  Service: General;  Laterality: N/A;   NO PAST SURGERIES     XI ROBOTIC ASSISTED LOWER ANTERIOR RESECTION N/A 01/12/2022   Procedure: XI ROBOTIC ASSISTED LOWER ANTERIOR RESECTION;  Surgeon: Teresa Lonni HERO, MD;  Location: [facility];  Service: General;  Laterality: N/A;    I have reviewed the social history and family history with the patient and they are unchanged from previous note.  ALLERGIES:  is allergic to shellfish allergy.  MEDICATIONS:  Current Outpatient Medications  Medication Sig Dispense Refill   acetaminophen  (TYLENOL ) 500  MG tablet Take 1,000 mg by mouth every 6 (six) hours as needed for moderate pain (pain.).     atenolol (TENORMIN) 25 MG tablet Take 25 mg by mouth daily.     Ensure (ENSURE) Take 237 mLs by mouth daily.     escitalopram  (LEXAPRO ) 10 MG tablet Take 10 mg by mouth at bedtime.     ibuprofen  (ADVIL ) 200 MG tablet Take 400 mg by mouth daily as needed (pain.).     omeprazole  (PRILOSEC) 20 MG capsule TAKE ONE CAPSULE BY MOUTH ONE TIME DAILY 30 capsule 0   oxymetazoline (AFRIN) 0.05 % nasal spray Place 1 spray into both nostrils daily as needed for congestion.     triamcinolone (NASACORT ALLERGY 24HR) 55 MCG/ACT AERO nasal inhaler Place 1 spray into the nose daily as needed (allergies).     No current facility-administered medications for this visit.    PHYSICAL EXAMINATION: ECOG PERFORMANCE STATUS: 0 - Asymptomatic  Vitals:   03/11/24 1511  BP: 126/76  Pulse: 81  Resp: 15  Temp: 98.4 F (36.9 C)  SpO2: 97%   Wt Readings from Last 3 Encounters:  03/11/24 194 lb 8 oz (88.2 kg)  09/10/23 193 lb 3.2 oz (87.6 kg)  03/05/23 187 lb 8 oz (85 kg)     GENERAL:alert, no distress and comfortable SKIN: skin color, texture, turgor are normal, no rashes or significant lesions EYES: normal, Conjunctiva  are pink and non-injected, sclera clear NECK: supple, thyroid  normal size, non-tender, without nodularity LYMPH:  no palpable lymphadenopathy in the cervical, axillary  LUNGS: clear to auscultation and percussion with normal breathing effort HEART: regular rate & rhythm and no murmurs and no lower extremity edema ABDOMEN:abdomen soft, non-tender and normal bowel sounds Musculoskeletal:no cyanosis of digits and no clubbing  NEURO: alert & oriented x 3 with fluent speech, no focal motor/sensory deficits  Physical Exam   LABORATORY DATA:  I have reviewed the data as listed    Latest Ref Rng & Units 03/11/2024    2:54 PM 09/03/2023    9:38 AM 03/05/2023   11:36 AM  CBC  WBC 4.0 - 10.5 K/uL 5.4   5.6  4.4   Hemoglobin 13.0 - 17.0 g/dL 84.8  85.1  84.9   Hematocrit 39.0 - 52.0 % 44.1  43.7  43.0   Platelets 150 - 400 K/uL 281  252  278         Latest Ref Rng & Units 03/11/2024    2:54 PM 09/03/2023    9:38 AM 03/05/2023   11:36 AM  CMP  Glucose 70 - 99 mg/dL 96  899  96   BUN 6 - 20 mg/dL 15  13  14    Creatinine 0.61 - 1.24 mg/dL 8.62  8.71  8.81   Sodium 135 - 145 mmol/L 139  139  138   Potassium 3.5 - 5.1 mmol/L 4.5  4.5  4.3   Chloride 98 - 111 mmol/L 103  105  104   CO2 22 - 32 mmol/L 31  30  29    Calcium  8.9 - 10.3 mg/dL 89.6  9.5  9.8   Total Protein 6.5 - 8.1 g/dL 7.4  7.2  7.3   Total Bilirubin 0.0 - 1.2 mg/dL 0.4  0.5  0.5   Alkaline Phos 38 - 126 U/L 54  48  56   AST 15 - 41 U/L 25  23  23    ALT 0 - 44 U/L 32  26  30       RADIOGRAPHIC STUDIES: I have personally reviewed the radiological images as listed and agreed with the findings in the report. No results found.    Orders Placed This Encounter  Procedures   CT CHEST ABDOMEN PELVIS W CONTRAST    Standing Status:   Future    Expected Date:   09/02/2024    Expiration Date:   03/11/2025    If indicated for the ordered procedure, I authorize the administration of contrast media per Radiology protocol:   Yes    Does the patient have a contrast media/X-ray dye allergy?:   No    Preferred imaging location?:   [facility]    Release to patient:   Immediate    If indicated for the ordered procedure, I authorize the administration of oral contrast media per Radiology protocol:   Yes   All questions were answered. The patient knows to call the clinic with any problems, questions or concerns. No barriers to learning was detected. The total time spent in the appointment was 20 minutes, including review of chart and various tests results, discussions about plan of care and coordination of care plan     Onita Mattock, MD 03/11/2024

## 2024-03-12 LAB — CEA (ACCESS): CEA (CHCC): 1.43 ng/mL (ref 0.00–5.00)

## 2024-09-02 ENCOUNTER — Inpatient Hospital Stay

## 2024-09-10 ENCOUNTER — Inpatient Hospital Stay: Admitting: Hematology
